# Patient Record
Sex: Male | Born: 1955 | Race: Black or African American | Hispanic: No | Marital: Single | Smoking: Never smoker
Health system: Southern US, Community
[De-identification: ages and names within clinical notes are randomized; demographics above are authoritative.]

## PROBLEM LIST (undated history)

## (undated) DIAGNOSIS — N400 Enlarged prostate without lower urinary tract symptoms: Secondary | ICD-10-CM

## (undated) DIAGNOSIS — I10 Essential (primary) hypertension: Secondary | ICD-10-CM

## (undated) DIAGNOSIS — I69322 Dysarthria following cerebral infarction: Secondary | ICD-10-CM

## (undated) DIAGNOSIS — R1310 Dysphagia, unspecified: Secondary | ICD-10-CM

## (undated) DIAGNOSIS — N029 Recurrent and persistent hematuria with unspecified morphologic changes: Secondary | ICD-10-CM

## (undated) DIAGNOSIS — I82409 Acute embolism and thrombosis of unspecified deep veins of unspecified lower extremity: Secondary | ICD-10-CM

## (undated) DIAGNOSIS — I639 Cerebral infarction, unspecified: Secondary | ICD-10-CM

## (undated) DIAGNOSIS — F32A Depression, unspecified: Secondary | ICD-10-CM

## (undated) DIAGNOSIS — F329 Major depressive disorder, single episode, unspecified: Secondary | ICD-10-CM

---

## 1998-01-10 ENCOUNTER — Other Ambulatory Visit: Admission: RE | Admit: 1998-01-10 | Discharge: 1998-01-10 | Payer: Self-pay | Admitting: Family Medicine

## 2001-06-21 ENCOUNTER — Ambulatory Visit (HOSPITAL_COMMUNITY): Admission: RE | Admit: 2001-06-21 | Discharge: 2001-06-21 | Payer: Self-pay | Admitting: Urology

## 2003-05-06 ENCOUNTER — Ambulatory Visit (HOSPITAL_BASED_OUTPATIENT_CLINIC_OR_DEPARTMENT_OTHER): Admission: RE | Admit: 2003-05-06 | Discharge: 2003-05-06 | Payer: Self-pay | Admitting: Internal Medicine

## 2003-06-05 ENCOUNTER — Encounter: Payer: Self-pay | Admitting: Gastroenterology

## 2003-06-05 ENCOUNTER — Encounter: Admission: RE | Admit: 2003-06-05 | Discharge: 2003-06-05 | Payer: Self-pay | Admitting: Gastroenterology

## 2003-06-30 ENCOUNTER — Ambulatory Visit (HOSPITAL_COMMUNITY): Admission: RE | Admit: 2003-06-30 | Discharge: 2003-06-30 | Payer: Self-pay | Admitting: Gastroenterology

## 2003-08-09 ENCOUNTER — Ambulatory Visit (HOSPITAL_BASED_OUTPATIENT_CLINIC_OR_DEPARTMENT_OTHER): Admission: RE | Admit: 2003-08-09 | Discharge: 2003-08-09 | Payer: Self-pay | Admitting: Internal Medicine

## 2004-08-20 ENCOUNTER — Ambulatory Visit: Payer: Self-pay | Admitting: Internal Medicine

## 2004-08-20 ENCOUNTER — Ambulatory Visit (HOSPITAL_BASED_OUTPATIENT_CLINIC_OR_DEPARTMENT_OTHER): Admission: RE | Admit: 2004-08-20 | Discharge: 2004-08-20 | Payer: Self-pay | Admitting: Internal Medicine

## 2004-12-10 ENCOUNTER — Ambulatory Visit (HOSPITAL_COMMUNITY): Admission: RE | Admit: 2004-12-10 | Discharge: 2004-12-10 | Payer: Self-pay | Admitting: Internal Medicine

## 2006-05-20 ENCOUNTER — Ambulatory Visit (HOSPITAL_BASED_OUTPATIENT_CLINIC_OR_DEPARTMENT_OTHER): Admission: RE | Admit: 2006-05-20 | Discharge: 2006-05-20 | Payer: Self-pay | Admitting: Internal Medicine

## 2006-05-24 ENCOUNTER — Ambulatory Visit: Payer: Self-pay | Admitting: Internal Medicine

## 2010-08-31 ENCOUNTER — Encounter: Payer: Self-pay | Admitting: Gastroenterology

## 2010-09-01 ENCOUNTER — Encounter: Payer: Self-pay | Admitting: Internal Medicine

## 2013-03-07 ENCOUNTER — Ambulatory Visit: Payer: Self-pay | Admitting: Family Medicine

## 2013-03-07 VITALS — BP 138/88 | HR 75 | Temp 98.0°F | Resp 16 | Ht 70.0 in | Wt 215.0 lb

## 2013-03-07 DIAGNOSIS — Z0289 Encounter for other administrative examinations: Secondary | ICD-10-CM

## 2013-03-07 NOTE — Progress Notes (Signed)
  Subjective:    Patient ID: [name] [name], male    DOB: 04-12-1956, 57 y.o.   MRN: [id number]  HPI [name] [name] is a 57 y.o. male  Dot physical. Hx of hematuria - hereditary, and followed by urology.   2 year card prior - no restrictions.    Review of Systems DOT form reviewed,     Objective:   Physical Exam  Vitals reviewed. Constitutional: He is oriented to person, place, and time. He appears well-developed and well-nourished.  HENT:  Head: Normocephalic and atraumatic.  Right Ear: External ear normal.  Left Ear: External ear normal.  Mouth/Throat: Oropharynx is clear and moist.  Eyes: Conjunctivae and EOM are normal. Pupils are equal, round, and reactive to light.  Neck: Normal range of motion. Neck supple. No thyromegaly present.  Cardiovascular: Normal rate, regular rhythm, normal heart sounds and intact distal pulses.   Pulmonary/Chest: Effort normal and breath sounds normal. No respiratory distress. He has no wheezes.  Abdominal: Soft. He exhibits no distension. There is no tenderness. Hernia confirmed negative in the right inguinal area and confirmed negative in the left inguinal area.  Musculoskeletal: Normal range of motion. He exhibits no edema and no tenderness.  Lymphadenopathy:    He has no cervical adenopathy.  Neurological: He is alert and oriented to person, place, and time. He has normal reflexes.  Skin: Skin is warm and dry.  Psychiatric: He has a normal mood and affect. His behavior is normal.       Assessment & Plan:  [name] is a 57 y.o. male Health examination of defined subpopulation  DOT physical. 2 year card. No restrictions.  Chronic hematuria, followed by urology.  No restrictions - 2 year card. See ppwk.

## 2013-04-12 HISTORY — PX: ESOPHAGOSCOPY W/ PERCUTANEOUS GASTROSTOMY TUBE PLACEMENT: SUR463

## 2013-04-25 ENCOUNTER — Other Ambulatory Visit (HOSPITAL_COMMUNITY): Payer: Self-pay | Admitting: Internal Medicine

## 2013-04-25 DIAGNOSIS — R1310 Dysphagia, unspecified: Secondary | ICD-10-CM

## 2013-04-26 ENCOUNTER — Emergency Department (HOSPITAL_COMMUNITY): Payer: BC Managed Care – PPO

## 2013-04-26 ENCOUNTER — Encounter (HOSPITAL_COMMUNITY): Payer: Self-pay | Admitting: *Deleted

## 2013-04-26 ENCOUNTER — Emergency Department (HOSPITAL_COMMUNITY)
Admission: EM | Admit: 2013-04-26 | Discharge: 2013-04-26 | Disposition: A | Discharge status: Skilled Nursing Facility | Payer: BC Managed Care – PPO | Attending: Emergency Medicine | Admitting: Emergency Medicine

## 2013-04-26 DIAGNOSIS — Z7982 Long term (current) use of aspirin: Secondary | ICD-10-CM | POA: Insufficient documentation

## 2013-04-26 DIAGNOSIS — Y9389 Activity, other specified: Secondary | ICD-10-CM | POA: Insufficient documentation

## 2013-04-26 DIAGNOSIS — W06XXXA Fall from bed, initial encounter: Secondary | ICD-10-CM | POA: Insufficient documentation

## 2013-04-26 DIAGNOSIS — I10 Essential (primary) hypertension: Secondary | ICD-10-CM | POA: Insufficient documentation

## 2013-04-26 DIAGNOSIS — Z86718 Personal history of other venous thrombosis and embolism: Secondary | ICD-10-CM | POA: Insufficient documentation

## 2013-04-26 DIAGNOSIS — S0180XA Unspecified open wound of other part of head, initial encounter: Secondary | ICD-10-CM | POA: Insufficient documentation

## 2013-04-26 DIAGNOSIS — F0390 Unspecified dementia without behavioral disturbance: Secondary | ICD-10-CM | POA: Insufficient documentation

## 2013-04-26 DIAGNOSIS — W19XXXA Unspecified fall, initial encounter: Secondary | ICD-10-CM

## 2013-04-26 DIAGNOSIS — Z7901 Long term (current) use of anticoagulants: Secondary | ICD-10-CM | POA: Insufficient documentation

## 2013-04-26 DIAGNOSIS — S0003XA Contusion of scalp, initial encounter: Secondary | ICD-10-CM | POA: Insufficient documentation

## 2013-04-26 DIAGNOSIS — Z79899 Other long term (current) drug therapy: Secondary | ICD-10-CM | POA: Insufficient documentation

## 2013-04-26 DIAGNOSIS — F329 Major depressive disorder, single episode, unspecified: Secondary | ICD-10-CM | POA: Insufficient documentation

## 2013-04-26 DIAGNOSIS — Y9210 Unspecified residential institution as the place of occurrence of the external cause: Secondary | ICD-10-CM | POA: Insufficient documentation

## 2013-04-26 DIAGNOSIS — F3289 Other specified depressive episodes: Secondary | ICD-10-CM | POA: Insufficient documentation

## 2013-04-26 DIAGNOSIS — S0083XA Contusion of other part of head, initial encounter: Secondary | ICD-10-CM

## 2013-04-26 DIAGNOSIS — Z8673 Personal history of transient ischemic attack (TIA), and cerebral infarction without residual deficits: Secondary | ICD-10-CM | POA: Insufficient documentation

## 2013-04-26 HISTORY — DX: Dysphagia, unspecified: R13.10

## 2013-04-26 HISTORY — DX: Essential (primary) hypertension: I10

## 2013-04-26 HISTORY — DX: Depression, unspecified: F32.A

## 2013-04-26 HISTORY — DX: Acute embolism and thrombosis of unspecified deep veins of unspecified lower extremity: I82.409

## 2013-04-26 HISTORY — DX: Major depressive disorder, single episode, unspecified: F32.9

## 2013-04-26 LAB — URINALYSIS, ROUTINE W REFLEX MICROSCOPIC
Ketones, ur: NEGATIVE mg/dL
Leukocytes, UA: NEGATIVE
Nitrite: NEGATIVE
Protein, ur: NEGATIVE mg/dL
pH: 5 (ref 5.0–8.0)

## 2013-04-26 LAB — CBC
HCT: 36.5 % — ABNORMAL LOW (ref 39.0–52.0)
Hemoglobin: 11.9 g/dL — ABNORMAL LOW (ref 13.0–17.0)
MCV: 98.9 fL (ref 78.0–100.0)
RBC: 3.69 MIL/uL — ABNORMAL LOW (ref 4.22–5.81)
RDW: 13.4 % (ref 11.5–15.5)
WBC: 11 10*3/uL — ABNORMAL HIGH (ref 4.0–10.5)

## 2013-04-26 LAB — BASIC METABOLIC PANEL
BUN: 35 mg/dL — ABNORMAL HIGH (ref 6–23)
CO2: 26 mEq/L (ref 19–32)
Chloride: 103 mEq/L (ref 96–112)
GFR calc Af Amer: 45 mL/min — ABNORMAL LOW (ref >90)
Potassium: 4 mEq/L (ref 3.5–5.1)

## 2013-04-26 LAB — PROTIME-INR: Prothrombin Time: 25.5 seconds — ABNORMAL HIGH (ref 11.6–15.2)

## 2013-04-26 LAB — LACTIC ACID, PLASMA: Lactic Acid, Venous: 2.4 mmol/L — ABNORMAL HIGH (ref 0.5–2.2)

## 2013-04-26 MED ORDER — SODIUM CHLORIDE 0.9 % IV BOLUS (SEPSIS)
500.0000 mL | Freq: Once | INTRAVENOUS | Status: AC
  Administered 2013-04-26: 500 mL via INTRAVENOUS

## 2013-04-26 NOTE — ED Provider Notes (Signed)
Approximately 1 cm linear laceration to the lateral aspect of the right eyebrow. Bleeding controlled. Cranial nerves III-XII grossly intact. Wound thoroughly cleaned with saline and beta-iodine. Dermabond placed.   Raymon Mutton, PA-C 04/26/13 [phone]

## 2013-04-26 NOTE — ED Notes (Signed)
Pt is on coumadin and asa

## 2013-04-26 NOTE — ED Provider Notes (Signed)
CSN: [id number]     Arrival date & time 04/26/13  2952 History   First MD Initiated Contact with Patient 04/26/13 0547     Chief Complaint  Patient presents with  . Fall  . Head Laceration   (Consider location/radiation/quality/duration/timing/severity/associated sxs/prior Treatment) HPI Comments: Stays at [facility]. Patient found in floor next to his bed - has hematoma to R periorbital area.  Unknown circumstances of falls - found on mat beside bed. Last time seen in bed - about 30 prior min to being found. No LOC.  At baseline - alert to himself. Follows commands. R sided paresis.  Patient is a 57 y.o. male presenting with fall and scalp laceration. The history is provided by the patient.  Fall This is a recurrent problem. Episode onset: unknown. Episode frequency: once. The problem has been resolved. Pertinent negatives include no chest pain, no abdominal pain and no headaches. Nothing aggravates the symptoms. Nothing relieves the symptoms. He has tried nothing for the symptoms.  Head Laceration Pertinent negatives include no chest pain, no abdominal pain and no headaches.    Past Medical History  Diagnosis Date  . Stroke   . Dysphagia   . DVT (deep venous thrombosis)   . Aphasia   . Hypertension   . Depression    No past surgical history on file. No family history on file. History  Substance Use Topics  . Smoking status: Never Smoker   . Smokeless tobacco: Not on file  . Alcohol Use: No    Review of Systems  Unable to perform ROS: Dementia  Cardiovascular: Negative for chest pain.  Gastrointestinal: Negative for abdominal pain.  Neurological: Negative for headaches.    Allergies  Review of patient's allergies indicates no known allergies.  Home Medications   Current Outpatient Rx  Name  Route  Sig  Dispense  Refill  . aspirin 81 MG chewable tablet   PEG Tube   81 mg by PEG Tube route daily.         [name] atorvastatin (LIPITOR) 40 MG tablet   PEG Tube   40 mg by PEG Tube route daily.         [name] enoxaparin (LOVENOX) 100 MG/ML injection   Subcutaneous   Inject 100 mg into the skin daily.         [name] LORazepam in dextrose solution   Intravenous   Inject 1 mg into the vein as needed (for agitation).         . polyethylene glycol (MIRALAX / GLYCOLAX) packet   PEG Tube   17 g by PEG Tube route daily as needed (for constipation).         . polyvinyl alcohol (LIQUIFILM TEARS) 1.4 % ophthalmic solution   Both Eyes   Place 1 drop into both eyes as needed (for dry eyes).         . QUEtiapine (SEROQUEL) 25 MG tablet   PEG Tube   25 mg by PEG Tube route at bedtime.         . senna-docusate (SENOKOT-S) 8.6-50 MG per tablet   PEG Tube   2 tablets by PEG Tube route every evening.         . sertraline (ZOLOFT) 25 MG tablet   PEG Tube   25 mg by PEG Tube route daily.         [name] tuberculin 5 UNIT/0.1ML injection   Intradermal   Inject 5 Units into the skin once.         [name]  warfarin (COUMADIN) 5 MG tablet   PEG Tube   5 mg by PEG Tube route daily.          BP 139/83  Pulse 90  Temp(Src) 99.3 F (37.4 C) (Oral)  Resp 18  SpO2 97% Physical Exam  Constitutional: He is oriented to person, place, and time. He appears well-developed and well-nourished. No distress.  HENT:  Head: Normocephalic.    Mouth/Throat: No oropharyngeal exudate.  EOMs normal, no entrapment  Eyes: EOM are normal. Pupils are equal, round, and reactive to light.  Neck: Normal range of motion. Neck supple.  Cardiovascular: Normal rate and regular rhythm.  Exam reveals no friction rub.   No murmur heard. Pulmonary/Chest: Effort normal and breath sounds normal. No respiratory distress. He has no wheezes. He has no rales.  Abdominal: He exhibits no distension. There is no tenderness. There is no rebound.  Musculoskeletal: Normal range of motion. He exhibits no edema.  Neurological: He is alert and oriented to person, place, and time.    Skin: He is not diaphoretic.    ED Course  Procedures (including critical care time) Labs Review Labs Reviewed  CBC - Abnormal; Notable for the following:    WBC 11.0 (*)    RBC 3.69 (*)    Hemoglobin 11.9 (*)    HCT 36.5 (*)    All other components within normal limits  BASIC METABOLIC PANEL - Abnormal; Notable for the following:    Glucose, Bld 133 (*)    BUN 35 (*)    Creatinine, Ser 1.83 (*)    GFR calc non Af Amer 39 (*)    GFR calc Af Amer 45 (*)    All other components within normal limits  PROTIME-INR - Abnormal; Notable for the following:    Prothrombin Time 25.5 (*)    INR 2.42 (*)    All other components within normal limits  LACTIC ACID, PLASMA - Abnormal; Notable for the following:    Lactic Acid, Venous 2.4 (*)    All other components within normal limits  URINALYSIS, ROUTINE W REFLEX MICROSCOPIC - Abnormal; Notable for the following:    Hgb urine dipstick LARGE (*)    All other components within normal limits  URINE MICROSCOPIC-ADD ON   Imaging Review Ct Head Wo Contrast - If Head Trauma Is Known/suspected And/or Pt Is Taking Anticoagulant  04/26/2013   CLINICAL DATA:  57 year old male status post fall with blunt trauma, hematoma, laceration.  EXAM: CT HEAD WITHOUT CONTRAST  CT MAXILLOFACIAL WITHOUT CONTRAST  CT CERVICAL SPINE WITHOUT CONTRAST  TECHNIQUE: Multidetector CT imaging of the head, cervical spine, and maxillofacial structures were performed using the standard protocol without intravenous contrast. Multiplanar CT image reconstructions of the cervical spine and maxillofacial structures were also generated.  COMPARISON:  None.  FINDINGS: CT HEAD FINDINGS  Right forehead scalp hematoma measuring up to 9 mm in thickness. Right frontal bone intact. Superior scalp soft tissues within normal limits. No calvarium fracture. Mastoids are clear.  Confluent and profound hypodensity in the left brainstem (series 2, image 8). Elsewhere posterior fossa gray-white matter  differentiation within normal limits. No midline shift, mass effect, or evidence of intracranial mass lesion. No ventriculomegaly. No acute intracranial hemorrhage identified. No evidence of cortically based acute infarction identified. No suspicious intracranial vascular hyperdensity.  CT MAXILLOFACIAL FINDINGS  Right periorbital soft tissue injury contiguous with that at the right forehead. Right globe intact. Bilateral intraorbital soft tissues are within normal limits.  Visible deep soft tissue  spaces of the face and neck remarkable for bilateral retropharyngeal carotid arteries. Small volume retained secretions in the pharynx.  Minor paranasal sinus mucosal thickening. Chronic appearing nasal process of the maxilla fractures. Mandible intact. No orbital wall fracture. No acute facial fracture.  CT CERVICAL SPINE FINDINGS  Straightening of cervical lordosis. Visualized skull base is intact. No atlanto-occipital dissociation. Cervicothoracic junction alignment is within normal limits. Bilateral posterior element alignment is within normal limits. Chronic disc and endplate degeneration most pronounced at C5-C6. No acute cervical fracture identified.  Negative lung apices. Visible upper thoracic levels appear intact. Visualized paraspinal soft tissues are within normal limits. Retropharyngeal course of both carotid arteries at the upper cervical spine level.  IMPRESSION: CT HEAD IMPRESSION  1. No acute traumatic injury to the brain. Chronic left brainstem infarct.  2.  Forehead scalp hematoma without underlying fracture.  CT MAXILLOFACIAL IMPRESSION  Right periorbital soft tissue injury. No acute facial fracture identified.  CT CERVICAL SPINE IMPRESSION  No acute fracture or listhesis identified in the cervical spine. Ligamentous injury is not excluded.   Electronically Signed   By: Augusto Gamble M.D.   On: 04/26/2013 07:59   Ct Cervical Spine Wo Contrast  04/26/2013   CLINICAL DATA:  57 year old male status post  fall with blunt trauma, hematoma, laceration.  EXAM: CT HEAD WITHOUT CONTRAST  CT MAXILLOFACIAL WITHOUT CONTRAST  CT CERVICAL SPINE WITHOUT CONTRAST  TECHNIQUE: Multidetector CT imaging of the head, cervical spine, and maxillofacial structures were performed using the standard protocol without intravenous contrast. Multiplanar CT image reconstructions of the cervical spine and maxillofacial structures were also generated.  COMPARISON:  None.  FINDINGS: CT HEAD FINDINGS  Right forehead scalp hematoma measuring up to 9 mm in thickness. Right frontal bone intact. Superior scalp soft tissues within normal limits. No calvarium fracture. Mastoids are clear.  Confluent and profound hypodensity in the left brainstem (series 2, image 8). Elsewhere posterior fossa gray-white matter differentiation within normal limits. No midline shift, mass effect, or evidence of intracranial mass lesion. No ventriculomegaly. No acute intracranial hemorrhage identified. No evidence of cortically based acute infarction identified. No suspicious intracranial vascular hyperdensity.  CT MAXILLOFACIAL FINDINGS  Right periorbital soft tissue injury contiguous with that at the right forehead. Right globe intact. Bilateral intraorbital soft tissues are within normal limits.  Visible deep soft tissue spaces of the face and neck remarkable for bilateral retropharyngeal carotid arteries. Small volume retained secretions in the pharynx.  Minor paranasal sinus mucosal thickening. Chronic appearing nasal process of the maxilla fractures. Mandible intact. No orbital wall fracture. No acute facial fracture.  CT CERVICAL SPINE FINDINGS  Straightening of cervical lordosis. Visualized skull base is intact. No atlanto-occipital dissociation. Cervicothoracic junction alignment is within normal limits. Bilateral posterior element alignment is within normal limits. Chronic disc and endplate degeneration most pronounced at C5-C6. No acute cervical fracture  identified.  Negative lung apices. Visible upper thoracic levels appear intact. Visualized paraspinal soft tissues are within normal limits. Retropharyngeal course of both carotid arteries at the upper cervical spine level.  IMPRESSION: CT HEAD IMPRESSION  1. No acute traumatic injury to the brain. Chronic left brainstem infarct.  2.  Forehead scalp hematoma without underlying fracture.  CT MAXILLOFACIAL IMPRESSION  Right periorbital soft tissue injury. No acute facial fracture identified.  CT CERVICAL SPINE IMPRESSION  No acute fracture or listhesis identified in the cervical spine. Ligamentous injury is not excluded.   Electronically Signed   By: Augusto Gamble M.D.   On: 04/26/2013  07:59   Ct Maxillofacial Wo Cm  04/26/2013   CLINICAL DATA:  57 year old male status post fall with blunt trauma, hematoma, laceration.  EXAM: CT HEAD WITHOUT CONTRAST  CT MAXILLOFACIAL WITHOUT CONTRAST  CT CERVICAL SPINE WITHOUT CONTRAST  TECHNIQUE: Multidetector CT imaging of the head, cervical spine, and maxillofacial structures were performed using the standard protocol without intravenous contrast. Multiplanar CT image reconstructions of the cervical spine and maxillofacial structures were also generated.  COMPARISON:  None.  FINDINGS: CT HEAD FINDINGS  Right forehead scalp hematoma measuring up to 9 mm in thickness. Right frontal bone intact. Superior scalp soft tissues within normal limits. No calvarium fracture. Mastoids are clear.  Confluent and profound hypodensity in the left brainstem (series 2, image 8). Elsewhere posterior fossa gray-white matter differentiation within normal limits. No midline shift, mass effect, or evidence of intracranial mass lesion. No ventriculomegaly. No acute intracranial hemorrhage identified. No evidence of cortically based acute infarction identified. No suspicious intracranial vascular hyperdensity.  CT MAXILLOFACIAL FINDINGS  Right periorbital soft tissue injury contiguous with that at the  right forehead. Right globe intact. Bilateral intraorbital soft tissues are within normal limits.  Visible deep soft tissue spaces of the face and neck remarkable for bilateral retropharyngeal carotid arteries. Small volume retained secretions in the pharynx.  Minor paranasal sinus mucosal thickening. Chronic appearing nasal process of the maxilla fractures. Mandible intact. No orbital wall fracture. No acute facial fracture.  CT CERVICAL SPINE FINDINGS  Straightening of cervical lordosis. Visualized skull base is intact. No atlanto-occipital dissociation. Cervicothoracic junction alignment is within normal limits. Bilateral posterior element alignment is within normal limits. Chronic disc and endplate degeneration most pronounced at C5-C6. No acute cervical fracture identified.  Negative lung apices. Visible upper thoracic levels appear intact. Visualized paraspinal soft tissues are within normal limits. Retropharyngeal course of both carotid arteries at the upper cervical spine level.  IMPRESSION: CT HEAD IMPRESSION  1. No acute traumatic injury to the brain. Chronic left brainstem infarct.  2.  Forehead scalp hematoma without underlying fracture.  CT MAXILLOFACIAL IMPRESSION  Right periorbital soft tissue injury. No acute facial fracture identified.  CT CERVICAL SPINE IMPRESSION  No acute fracture or listhesis identified in the cervical spine. Ligamentous injury is not excluded.   Electronically Signed   By: Augusto Gamble M.D.   On: 04/26/2013 07:59     Date: 04/26/2013  Rate: 90  Rhythm: normal sinus rhythm  QRS Axis: normal  Intervals: normal  ST/T Wave abnormalities: normal  Conduction Disutrbances:none  Narrative Interpretation:   Old EKG Reviewed: none available   MDM   1. Fall, initial encounter   2. Traumatic hematoma of forehead, initial encounter    22M presents s/p fall - unknown circumstances - found on a mat beside his bed. No LOC. Alert x1 at baseline with R sided paresis. Patient here  alert, following commands. R periorbital swelling, EOMs intact. Small superificial laceration to R lateral eyebrow - repaired with dermabond. I spoke with facility - they will have a physician see the patient today.  CTs ordered. Labs show mild elevation in lactate, 1 L NS given. No UTI. INR 2.42 - on Coumadin for DVT.  Repeat lactate normal after fluid. Imaging with acute intracranial pathology. Stable for transfer back to facility.     Dagmar Hait, MD 04/27/13 [phone]

## 2013-04-26 NOTE — ED Notes (Signed)
Results of lactic acid shown to dr. Gwendolyn Grant

## 2013-04-26 NOTE — ED Notes (Signed)
Report received, assumed care.  

## 2013-04-26 NOTE — ED Notes (Signed)
[facility] called for transportation  

## 2013-04-26 NOTE — ED Notes (Signed)
Patient transported to CT 

## 2013-04-26 NOTE — ED Notes (Signed)
Pt discharged.Vital signs stable.Pt transported by [facility] to nursing home.

## 2013-04-26 NOTE — ED Provider Notes (Signed)
Medical screening examination/treatment/procedure(s) were conducted as a shared visit with non-physician practitioner(s) and myself.  I personally evaluated the patient during the encounter  See my note. Lac repair by PA.  Dagmar Hait, MD 04/26/13 [phone]

## 2013-04-26 NOTE — ED Notes (Signed)
Pt placed in soft [place] collar per MD order and precautions maintained

## 2013-04-26 NOTE — ED Notes (Signed)
Pt rolled out of bed and now has hematoma to right forehead.  Pt is from [facility] care and usually rolls off bed.  Pt has history stroke and is usually only alert to self.  bp 120/90. p-100

## 2013-04-30 ENCOUNTER — Emergency Department (HOSPITAL_COMMUNITY): Payer: BC Managed Care – PPO

## 2013-04-30 ENCOUNTER — Inpatient Hospital Stay (HOSPITAL_COMMUNITY)
Admission: EM | Admit: 2013-04-30 | Discharge: 2013-05-05 | DRG: 397 | Disposition: A | Discharge status: Skilled Nursing Facility | Payer: BC Managed Care – PPO | Attending: Internal Medicine | Admitting: Internal Medicine

## 2013-04-30 DIAGNOSIS — Z7901 Long term (current) use of anticoagulants: Secondary | ICD-10-CM

## 2013-04-30 DIAGNOSIS — I639 Cerebral infarction, unspecified: Secondary | ICD-10-CM

## 2013-04-30 DIAGNOSIS — R1310 Dysphagia, unspecified: Secondary | ICD-10-CM

## 2013-04-30 DIAGNOSIS — Z978 Presence of other specified devices: Secondary | ICD-10-CM

## 2013-04-30 DIAGNOSIS — I10 Essential (primary) hypertension: Secondary | ICD-10-CM | POA: Diagnosis present

## 2013-04-30 DIAGNOSIS — N1330 Unspecified hydronephrosis: Secondary | ICD-10-CM | POA: Diagnosis present

## 2013-04-30 DIAGNOSIS — R791 Abnormal coagulation profile: Secondary | ICD-10-CM

## 2013-04-30 DIAGNOSIS — S0181XA Laceration without foreign body of other part of head, initial encounter: Secondary | ICD-10-CM

## 2013-04-30 DIAGNOSIS — I69322 Dysarthria following cerebral infarction: Secondary | ICD-10-CM

## 2013-04-30 DIAGNOSIS — N390 Urinary tract infection, site not specified: Secondary | ICD-10-CM

## 2013-04-30 DIAGNOSIS — Z79899 Other long term (current) drug therapy: Secondary | ICD-10-CM

## 2013-04-30 DIAGNOSIS — R339 Retention of urine, unspecified: Secondary | ICD-10-CM | POA: Diagnosis present

## 2013-04-30 DIAGNOSIS — D62 Acute posthemorrhagic anemia: Secondary | ICD-10-CM | POA: Diagnosis present

## 2013-04-30 DIAGNOSIS — F3289 Other specified depressive episodes: Secondary | ICD-10-CM | POA: Diagnosis present

## 2013-04-30 DIAGNOSIS — I69991 Dysphagia following unspecified cerebrovascular disease: Secondary | ICD-10-CM

## 2013-04-30 DIAGNOSIS — I82A19 Acute embolism and thrombosis of unspecified axillary vein: Secondary | ICD-10-CM | POA: Diagnosis present

## 2013-04-30 DIAGNOSIS — Z86718 Personal history of other venous thrombosis and embolism: Secondary | ICD-10-CM

## 2013-04-30 DIAGNOSIS — D689 Coagulation defect, unspecified: Principal | ICD-10-CM | POA: Diagnosis present

## 2013-04-30 DIAGNOSIS — I69959 Hemiplegia and hemiparesis following unspecified cerebrovascular disease affecting unspecified side: Secondary | ICD-10-CM

## 2013-04-30 DIAGNOSIS — K625 Hemorrhage of anus and rectum: Secondary | ICD-10-CM | POA: Diagnosis present

## 2013-04-30 DIAGNOSIS — I82A11 Acute embolism and thrombosis of right axillary vein: Secondary | ICD-10-CM | POA: Diagnosis present

## 2013-04-30 DIAGNOSIS — I69920 Aphasia following unspecified cerebrovascular disease: Secondary | ICD-10-CM

## 2013-04-30 DIAGNOSIS — D72829 Elevated white blood cell count, unspecified: Secondary | ICD-10-CM | POA: Diagnosis present

## 2013-04-30 DIAGNOSIS — I69351 Hemiplegia and hemiparesis following cerebral infarction affecting right dominant side: Secondary | ICD-10-CM

## 2013-04-30 DIAGNOSIS — Z934 Other artificial openings of gastrointestinal tract status: Secondary | ICD-10-CM

## 2013-04-30 DIAGNOSIS — E870 Hyperosmolality and hypernatremia: Secondary | ICD-10-CM | POA: Diagnosis present

## 2013-04-30 DIAGNOSIS — F329 Major depressive disorder, single episode, unspecified: Secondary | ICD-10-CM | POA: Diagnosis present

## 2013-04-30 DIAGNOSIS — K668 Other specified disorders of peritoneum: Secondary | ICD-10-CM

## 2013-04-30 DIAGNOSIS — N138 Other obstructive and reflux uropathy: Secondary | ICD-10-CM | POA: Diagnosis present

## 2013-04-30 DIAGNOSIS — E876 Hypokalemia: Secondary | ICD-10-CM

## 2013-04-30 DIAGNOSIS — N401 Enlarged prostate with lower urinary tract symptoms: Secondary | ICD-10-CM | POA: Diagnosis present

## 2013-04-30 DIAGNOSIS — N029 Recurrent and persistent hematuria with unspecified morphologic changes: Secondary | ICD-10-CM

## 2013-04-30 DIAGNOSIS — D649 Anemia, unspecified: Secondary | ICD-10-CM

## 2013-04-30 DIAGNOSIS — K922 Gastrointestinal hemorrhage, unspecified: Secondary | ICD-10-CM

## 2013-04-30 HISTORY — DX: Recurrent and persistent hematuria with unspecified morphologic changes: N02.9

## 2013-04-30 HISTORY — DX: Dysarthria following cerebral infarction: I69.322

## 2013-04-30 HISTORY — DX: Cerebral infarction, unspecified: I63.9

## 2013-04-30 LAB — COMPREHENSIVE METABOLIC PANEL
AST: 33 U/L (ref 0–37)
Alkaline Phosphatase: 54 U/L (ref 39–117)
BUN: 27 mg/dL — ABNORMAL HIGH (ref 6–23)
CO2: 29 mEq/L (ref 19–32)
Chloride: 110 mEq/L (ref 96–112)
Creatinine, Ser: 1.29 mg/dL (ref 0.50–1.35)
GFR calc non Af Amer: 60 mL/min — ABNORMAL LOW (ref >90)
Total Bilirubin: 0.6 mg/dL (ref 0.3–1.2)

## 2013-04-30 LAB — CBC WITH DIFFERENTIAL/PLATELET
Eosinophils Relative: 0 % (ref 0–5)
HCT: 32 % — ABNORMAL LOW (ref 39.0–52.0)
Hemoglobin: 10.3 g/dL — ABNORMAL LOW (ref 13.0–17.0)
Lymphs Abs: 1.6 10*3/uL (ref 0.7–4.0)
MCV: 99.4 fL (ref 78.0–100.0)
Monocytes Relative: 7 % (ref 3–12)
Neutro Abs: 12.8 10*3/uL — ABNORMAL HIGH (ref 1.7–7.7)
RBC: 3.22 MIL/uL — ABNORMAL LOW (ref 4.22–5.81)
WBC: 15.5 10*3/uL — ABNORMAL HIGH (ref 4.0–10.5)

## 2013-04-30 LAB — PROTIME-INR: Prothrombin Time: 47.7 seconds — ABNORMAL HIGH (ref 11.6–15.2)

## 2013-04-30 LAB — URINALYSIS, ROUTINE W REFLEX MICROSCOPIC
Ketones, ur: NEGATIVE mg/dL
Nitrite: NEGATIVE
Protein, ur: NEGATIVE mg/dL
Specific Gravity, Urine: 1.017 (ref 1.005–1.030)
Urobilinogen, UA: 1 mg/dL (ref 0.0–1.0)

## 2013-04-30 LAB — APTT: aPTT: 74 seconds — ABNORMAL HIGH (ref 24–37)

## 2013-04-30 LAB — CG4 I-STAT (LACTIC ACID): Lactic Acid, Venous: 1.48 mmol/L (ref 0.5–2.2)

## 2013-04-30 LAB — URINE MICROSCOPIC-ADD ON

## 2013-04-30 LAB — TYPE AND SCREEN: ABO/RH(D): A POS

## 2013-04-30 MED ORDER — IOHEXOL 300 MG/ML  SOLN: 50.0000 mL | Freq: Once | INTRAMUSCULAR | Status: DC | PRN

## 2013-04-30 MED ORDER — DEXTROSE 5 % IV SOLN
1.0000 g | Freq: Once | INTRAVENOUS | Status: AC
  Administered 2013-04-30: 1 g via INTRAVENOUS
  Filled 2013-04-30: qty 10

## 2013-04-30 MED ORDER — IOHEXOL 300 MG/ML  SOLN
50.0000 mL | Freq: Once | INTRAMUSCULAR | Status: AC | PRN
  Administered 2013-04-30: 50 mL via ORAL

## 2013-04-30 MED ORDER — SODIUM CHLORIDE 0.9 % IV BOLUS (SEPSIS)
1000.0000 mL | Freq: Once | INTRAVENOUS | Status: AC
  Administered 2013-04-30: 1000 mL via INTRAVENOUS

## 2013-04-30 MED ORDER — PANTOPRAZOLE SODIUM 40 MG IV SOLR
40.0000 mg | Freq: Once | INTRAVENOUS | Status: AC
  Administered 2013-04-30: 40 mg via INTRAVENOUS
  Filled 2013-04-30: qty 40

## 2013-04-30 NOTE — Consult Note (Signed)
Reason for Consult:free air Referring Physician: Nita Sickle PA  [name] [name] is an 57 y.o. male.  HPI: I was asked to evaluate this patient for pneumoperitoneum. About one month ago he had a stroke with right-sided deficits. He subsequently had nasoenteric feeding tube which he kept removing and PEG tube was placed about 2 weeks ago at [facility]. He is at a care facility and they brought him into the emergency room for evaluation of bloody stools. He was also noted to have a temperature of 103 at the facility, although he has been afebrile here. Chest x-ray was ordered for evaluation of a temperature, and this demonstrated pneumoperitoneum. He denies any NSAID use except for some occasional Motrin but he does take a baby aspirin. He is on Coumadin for his stroke. His family is at the bedside from which most of the history is obtained and they say that he has had some "tarry" stools. The patient currently denies any abdominal pain although he has had some pain with urination.  Past Medical History  Diagnosis Date  . Stroke   . Dysphagia   . DVT (deep venous thrombosis)   . Aphasia   . Hypertension   . Depression     No past surgical history on file.  No family history on file.  Social History:  reports that he has never smoked. He does not have any smokeless tobacco history on file. He reports that he does not drink alcohol or use illicit drugs.  Allergies: No Known Allergies  Medications: see EPIC  Results for orders placed during the hospital encounter of 04/30/13 (from the past 48 hour(s))  URINALYSIS, ROUTINE W REFLEX MICROSCOPIC     Status: Abnormal   Collection Time    04/30/13  7:06 PM      Result Value Range   Color, Urine YELLOW  YELLOW   APPearance CLOUDY (*) CLEAR   Specific Gravity, Urine 1.017  1.005 - 1.030   pH 6.5  5.0 - 8.0   Glucose, UA NEGATIVE  NEGATIVE mg/dL   Hgb urine dipstick LARGE (*) NEGATIVE   Bilirubin Urine NEGATIVE  NEGATIVE   Ketones, ur NEGATIVE  NEGATIVE mg/dL   Protein, ur NEGATIVE  NEGATIVE mg/dL   Urobilinogen, UA 1.0  0.0 - 1.0 mg/dL   Nitrite NEGATIVE  NEGATIVE   Leukocytes, UA LARGE (*) NEGATIVE  URINE MICROSCOPIC-ADD ON     Status: Abnormal   Collection Time    04/30/13  7:06 PM      Result Value Range   WBC, UA TOO NUMEROUS TO COUNT  <3 WBC/hpf   Comment: WITH CLUMPS   RBC / HPF 11-20  <3 RBC/hpf   Bacteria, UA MANY (*) RARE  CBC WITH DIFFERENTIAL     Status: Abnormal   Collection Time    04/30/13  8:05 PM      Result Value Range   WBC 15.5 (*) 4.0 - 10.5 K/uL   RBC 3.22 (*) 4.22 - 5.81 MIL/uL   Hemoglobin 10.3 (*) 13.0 - 17.0 g/dL   HCT 16.1 (*) 09.6 - 04.5 %   MCV 99.4  78.0 - 100.0 fL   MCH 32.0  26.0 - 34.0 pg   MCHC 32.2  30.0 - 36.0 g/dL   RDW 40.9  81.1 - 91.4 %   Platelets 214  150 - 400 K/uL   Neutrophils Relative % 83 (*) 43 - 77 %   Lymphocytes Relative 10 (*) 12 - 46 %  Monocytes Relative 7  3 - 12 %   Eosinophils Relative 0  0 - 5 %   Basophils Relative 0  0 - 1 %   Neutro Abs 12.8 (*) 1.7 - 7.7 K/uL   Lymphs Abs 1.6  0.7 - 4.0 K/uL   Monocytes Absolute 1.1 (*) 0.1 - 1.0 K/uL   Eosinophils Absolute 0.0  0.0 - 0.7 K/uL   Basophils Absolute 0.0  0.0 - 0.1 K/uL   WBC Morphology ATYPICAL LYMPHOCYTES     Comment: VACUOLATED NEUTROPHILS     DOHLE BODIES  COMPREHENSIVE METABOLIC PANEL     Status: Abnormal   Collection Time    04/30/13  8:05 PM      Result Value Range   Sodium 148 (*) 135 - 145 mEq/L   Potassium 3.7  3.5 - 5.1 mEq/L   Chloride 110  96 - 112 mEq/L   CO2 29  19 - 32 mEq/L   Glucose, Bld 124 (*) 70 - 99 mg/dL   BUN 27 (*) 6 - 23 mg/dL   Creatinine, Ser 4.09  0.50 - 1.35 mg/dL   Calcium 9.3  8.4 - 81.1 mg/dL   Total Protein 7.0  6.0 - 8.3 g/dL   Albumin 2.4 (*) 3.5 - 5.2 g/dL   AST 33  0 - 37 U/L   ALT 104 (*) 0 - 53 U/L   Alkaline Phosphatase 54  39 - 117 U/L   Total Bilirubin 0.6  0.3 - 1.2 mg/dL   GFR calc non Af Amer 60 (*) >90 mL/min   GFR calc Af  Amer 69 (*) >90 mL/min   Comment: (NOTE)     The eGFR has been calculated using the CKD EPI equation.     This calculation has not been validated in all clinical situations.     eGFR's persistently <90 mL/min signify possible Chronic Kidney     Disease.  TYPE AND SCREEN     Status: None   Collection Time    04/30/13  8:05 PM      Result Value Range   ABO/RH(D) A POS     Antibody Screen NEG     Sample Expiration 05/03/2013    PROTIME-INR     Status: Abnormal   Collection Time    04/30/13  8:05 PM      Result Value Range   Prothrombin Time 47.7 (*) 11.6 - 15.2 seconds   INR 5.49 (*) 0.00 - 1.49   Comment: REPEATED TO VERIFY     CRITICAL RESULT CALLED TO, READ BACK BY AND VERIFIED WITH:     A DENNIS AT 2133 ON 09.20.2014 BY NBROOKS  APTT     Status: Abnormal   Collection Time    04/30/13  8:05 PM      Result Value Range   aPTT 74 (*) 24 - 37 seconds   Comment:            IF BASELINE aPTT IS ELEVATED,     SUGGEST PATIENT RISK ASSESSMENT     BE USED TO DETERMINE APPROPRIATE     ANTICOAGULANT THERAPY.  ABO/RH     Status: None   Collection Time    04/30/13  8:05 PM      Result Value Range   ABO/RH(D) A POS    CG4 I-STAT (LACTIC ACID)     Status: None   Collection Time    04/30/13  8:23 PM      Result Value Range   Lactic  Acid, Venous 1.48  0.5 - 2.2 mmol/L  PREPARE FRESH FROZEN PLASMA     Status: None   Collection Time    04/30/13  8:42 PM      Result Value Range   Unit Number [id number]     Blood Component Type THAWED PLASMA     Unit division 00     Status of Unit ALLOCATED     Transfusion Status OK TO TRANSFUSE     Unit Number [id number]     Blood Component Type THAWED PLASMA     Unit division 00     Status of Unit ALLOCATED     Transfusion Status OK TO TRANSFUSE      Dg Chest 2 View  04/30/2013   CLINICAL DATA:  Pain and fever  EXAM: CHEST  2 VIEW  COMPARISON:  None.  FINDINGS: There is extensive pneumoperitoneum. There is no edema or consolidation. Heart  is upper normal in size with normal pulmonary vascularity. No adenopathy. No bone lesions.  IMPRESSION: Pneumoperitoneum.  No edema or consolidation.  CriticalValue/emergent results were called by telephone at the time of interpretation on 04/30/2013 at 8:18 PMto the emergency department physician covering [facility], who verbally acknowledged these results.   Electronically Signed   By: Bretta Bang   On: 04/30/2013 20:20    All other review of systems negative or noncontributory except as stated in the HPI   Blood pressure 125/82, pulse 91, temperature 98.3 F (36.8 C), temperature source Oral, resp. rate 16, SpO2 96.00%. General appearance: alert, cooperative and no distress Head: Normocephalic, without obvious abnormality, atraumatic, facial asymmetry Resp: nonlabored Cardio: normal rate GI: soft, completely nontender, ND, no tympany, no peritoneal signs, LUQ gtube and site looks okay Extremities: extremities normal, atraumatic, no cyanosis or edema Pulses: 2+ and symmetric Neurologic: Alert and oriented X 3, notable right sided weakness, and facial symmetry, speech difficulty  Assessment/Plan: Pneumoperitoneum of uncertain etiology.  I am sure what is causing his pneumoperitoneum but he is completely pain-free, nondistended, and nontympanitic. This could be from his G-tube, both the pneumoperitoneum as well as the blood in the stools.  His G-tube may have pulled out or he could have another primary cause such as perforated peptic ulcer or duodenal ulcer or diverticulitis or other GI perforation. However, since she is completely pain-free and shows no evidence of free perforation, and the fact that his INR is over 5, I do not see any need for emergent surgery at this time. I would recommend further investigation of the potential source. I would first do a tube check of his G-tube to make sure that it is placed properly and if this is in the stomach, then I'll recommend a contrast  CT scan. If there is no extravasation of contrast, then I would consider nonoperative management given his exam.  If he does show evidence of perforation and required surgery then he will need his INR corrected however I would not necessarily give him any FFP at this time because again, his abdomen is completely benign and I would not offer any surgery unless there is clearly free perforation. We will discuss the options after further investigation but the patient and the family were in agreement with this plan.  We did discuss the usual causes of pneumoperitoneum and they understand that prompt surgical management is usually crucial, but they also would not like to pursue surgery without further evidence that he does have perforation. I would also recommend PPI's and antibiotic management  for his UTI.  Lodema Pilot DAVID 04/30/2013, 10:31 PM

## 2013-04-30 NOTE — ED Provider Notes (Signed)
CSN: [id number]     Arrival date & time 04/30/13  1828 History   First MD Initiated Contact with Patient 04/30/13 1842     Chief Complaint  Patient presents with  . GI Bleeding   (Consider location/radiation/quality/duration/timing/severity/associated sxs/prior Treatment) HPI Comments: Patient with a history of CVA, HTN, and DVT presents today from Nursing Home due to rectal bleeding.  Nursing home staff noticed dark red blood in the patient's stool earlier today.  Stool has also been watery in consistency today.  Family also reports that the patient has been complaining of diffuse abdominal pain for the past week.  According to nursing home staff the patient began running a fever of 103 today.  Patient was given Tylenol by staff just prior to arrival.  Patient has not had any nausea or vomiting.  Patient does report some abdominal pain at this time.  He denies chest pain, cough, or SOB.  He also reports that he has had some dysuria over the past couple of days.  Patient is currently on Coumadin due to past history of DVT.  Patient has also been recently hospitalized at [facility] two weeks ago due to a CVA.  He does have some residual right sided weakness and aphasia secondary to the CVA.  He had a PEG tube placed at [facility] two weeks ago.    The history is provided by the patient.    Past Medical History  Diagnosis Date  . Stroke   . Dysphagia   . DVT (deep venous thrombosis)   . Aphasia   . Hypertension   . Depression    No past surgical history on file. No family history on file. History  Substance Use Topics  . Smoking status: Never Smoker   . Smokeless tobacco: Not on file  . Alcohol Use: No    Review of Systems  Constitutional: Positive for fever.  Gastrointestinal: Positive for abdominal pain, diarrhea and blood in stool. Negative for vomiting.  All other systems reviewed and are negative.    Allergies  Review of patient's allergies indicates no known  allergies.  Home Medications   Current Outpatient Rx  Name  Route  Sig  Dispense  Refill  . acetaminophen (TYLENOL) 160 MG/5ML liquid   Oral   Take by mouth every 4 (four) hours as needed for fever.         [name] aspirin 81 MG chewable tablet   PEG Tube   81 mg by PEG Tube route daily.         . feeding supplement (OSMOLITE 1 CAL) LIQD   Oral   Take 237 mLs by mouth 3 (three) times daily with meals.         . sertraline (ZOLOFT) 25 MG tablet   PEG Tube   25 mg by PEG Tube route daily.         [name] atorvastatin (LIPITOR) 40 MG tablet   PEG Tube   40 mg by PEG Tube route daily.         [name] enoxaparin (LOVENOX) 100 MG/ML injection   Subcutaneous   Inject 100 mg into the skin daily.         [name] LORazepam in dextrose solution   Intravenous   Inject 1 mg into the vein as needed (for agitation).         . polyethylene glycol (MIRALAX / GLYCOLAX) packet   PEG Tube   17 g by PEG Tube route daily as needed (for constipation).         [name]  polyvinyl alcohol (LIQUIFILM TEARS) 1.4 % ophthalmic solution   Both Eyes   Place 1 drop into both eyes as needed (for dry eyes).         . QUEtiapine (SEROQUEL) 25 MG tablet   PEG Tube   25 mg by PEG Tube route at bedtime.         . senna-docusate (SENOKOT-S) 8.6-50 MG per tablet   PEG Tube   2 tablets by PEG Tube route every evening.         . tuberculin 5 UNIT/0.1ML injection   Intradermal   Inject 5 Units into the skin once.         . warfarin (COUMADIN) 5 MG tablet   PEG Tube   5 mg by PEG Tube route daily.          BP 150/91  Pulse 114  Temp(Src) 98.6 F (37 C) (Oral)  Resp 20  SpO2 94% Physical Exam  Nursing note and vitals reviewed. Constitutional: He appears well-developed and well-nourished.  HENT:  Head: Normocephalic and atraumatic.  Cardiovascular: Normal rate, regular rhythm and normal heart sounds.   Pulmonary/Chest: Effort normal and breath sounds normal.  Abdominal: Soft. Normal appearance  and bowel sounds are normal. He exhibits no distension and no mass. There is generalized tenderness. There is no rebound and no guarding.  Genitourinary:  Patient with very watery brownish colored stool with a reddish colored tinge.    Neurological: He is alert.  Skin: Skin is warm and dry.  Psychiatric: He has a normal mood and affect.    ED Course  Procedures (including critical care time) Labs Review Labs Reviewed  CBC WITH DIFFERENTIAL  COMPREHENSIVE METABOLIC PANEL  PROTIME-INR  APTT  OCCULT BLOOD X 1 CARD TO LAB, STOOL  URINALYSIS, ROUTINE W REFLEX MICROSCOPIC  TYPE AND SCREEN   Imaging Review Dg Chest 2 View  04/30/2013   CLINICAL DATA:  Pain and fever  EXAM: CHEST  2 VIEW  COMPARISON:  None.  FINDINGS: There is extensive pneumoperitoneum. There is no edema or consolidation. Heart is upper normal in size with normal pulmonary vascularity. No adenopathy. No bone lesions.  IMPRESSION: Pneumoperitoneum.  No edema or consolidation.  CriticalValue/emergent results were called by telephone at the time of interpretation on 04/30/2013 at 8:18 PMto the emergency department physician covering [facility], who verbally acknowledged these results.   Electronically Signed   By: Bretta Bang   On: 04/30/2013 20:20   Dg Abd 1 View  04/30/2013   CLINICAL DATA:  Check PEG tube placement  EXAM: ABDOMEN - 1 VIEW  COMPARISON:  None.  FINDINGS: Contrast administered via left-sided feeding tube opacifies nondilated small bowel. There are some other high-density areas in the abdomen which are likely within bowel - present in all 4 quadrants. Pneumoperitoneum better seen previously. Nonobstructive bowel gas pattern. No abnormal intra-abdominal mass effect. Clear lung bases. Focally advanced degenerative disc changes at L4-5.  IMPRESSION: 1. Jejunostomy tube injection opacifies small bowel. Small collections of contrast elsewhere in the abdomen are favored intraluminal, which could be confirmed  with CT if there is ongoing suspicion for tube leakage.  2.  Pneumoperitoneum.   Electronically Signed   By: Tiburcio Pea   On: 04/30/2013 23:15   Ct Abdomen Pelvis W Contrast  05/01/2013   *RADIOLOGY REPORT*  Clinical Data: GI bleeding, fever, blood and air  CT ABDOMEN AND PELVIS WITH CONTRAST  Technique:  Multidetector CT imaging of the abdomen and pelvis was performed  following the standard protocol during bolus administration of intravenous contrast.  Contrast: OMNIPAQUE IOHEXOL 300 MG/ML  SOLN  Comparison: CT abdomen pelvis with and without contrast 11/03/2011 and MR abdomen 06/30/2003.  Findings: Lung bases:  There is mild bibasilar atelectasis. Calcified granuloma in the left lower lobe.  Negative for pleural effusion.  Negative for pneumothorax at the lung bases.  There is a moderate amount of pneumoperitoneum, predominately seen in the nondependent portion of the abdomen, with multiple tiny locules scattered throughout the upper abdomen bilaterally.  A pull-through type feeding tube traverses the left anterior abdominal wall and is positioned within a jejunal loop in the left upper quadrant.  Oral contrast was administered via the feeding tube, and by the time the exam was performed, the contrast has progressed throughout the small bowel loops distal to the feeding tube, and into the colon, to the level of the mid sigmoid colon. No free intraperitoneal spill of oral contrast is seen.  The stomach is decompressed.  The duodenum is unremarkable.  No free air is seen posterior to the stomach, or adjacent to the duodenum.  2.3 x 2.7 cm left adrenal gland nodule is stable.  This is previously been evaluated with MRI in 2004, and shown to be most consistent with a benign adenoma.  The patient has a markedly enlarged prostate gland (5.8 centers AP diameter by 6.4 cm transverse diameter by 6.1 cm craniocaudal span. The patient's urinary bladder is distended, and there is mild bilateral  hydroureteronephrosis.  These findings raise the possibility of possible bladder outlet obstruction related to the prostamegaly. There is a single tiny locule of gas in the nondependent portion of the urinary bladder.  Urinary bladder wall is normal in thickness.  There is a soft tissue nodule in the lower anterior abdomen, directly anterior to the anterior wall of the distended urinary bladder there is not deftly present on prior CT. This measures 1.5 x1.8 x 0.7 cm.  There is suggestion of mild urothelial enhancement of both ureters. Suggest correlation with urinalysis to exclude urinary tract infection.  Bilateral low density renal lesions appear without significant change in most consistent with benign cysts.  The liver, gallbladder, spleen, right adrenal gland, and pancreas are within normal limits.  Sigmoid colon diverticulosis is noted, without acute inflammatory change. There are a few locules of gas just inferior to the cecum, and there is some fluid in this region of the right lower quadrant. There is also a small amount of ascites in the dependent portion of the pelvis.  The appendix is normal.  Abdominal aorta normal in caliber.  Negative for lymphadenopathy.  No acute bony abnormalities identified.  No suspicious osseous lesion.  IMPRESSION:  1. Moderate volume of pneumoperitoneum.  No free intraperitoneal spill of contrast or focal gastrointestinal tract wall thickening is identified. A definite etiology for the free intraperitoneal air is not identified.  A pull-through type feeding tube is present within a loop of jejunum in the left upper quadrant.  Consider correlation with operative/procedure note.  This volume of pneumoperitoneum would not be expected  2 weeks after feeding tube placement (reportedly the procedure was performed approximately 2 weeks ago). 2. Marked prostamegaly.  Cannot exclude prostate carcinoma. Suggest correlation with PSA values.  The urinary bladder is markedly distended and  there is mild bilateral hydroureteronephrosis. Findings raise the possibility of bladder outlet obstruction secondary to prostamegaly. 3.  Single locule of gas in the urinary bladder.  Question if there has been recent Foley  catheter placement or other instrumentation to explain this. 4.  Mild urothelial enhancement of the ureters.  Cannot exclude urinary tract infection.  Suggest correlation with urinalysis. 5.  1.5 cm soft tissue nodule in the lower anterior abdomen, directly anterior to the urinary bladder.  This appears new/increased compared to prior studies.  A reactive lymph node or metastatic peritoneal implant cannot be excluded if the patient has an underlying malignancy. 6.  Small volume of pelvic ascites. 7.  Stable left adrenal gland adenoma.   Original Report Authenticated By: Britta Mccreedy, M.D.    9:04 PM Discussed with Dr. Biagio Quint with [facility].  He reports that he will be down to evaluate the patient.  Patient has been evaluated by Dr. Biagio Quint with [facility].  He recommends getting a KUB with contrast to confirm placement of the PEG tube.  He then recommends getting a CT ab/pelvis with contrast.  2:15 AM Results of the CT discussed with Dr. Biagio Quint.  He recommends having the patient admitted to [facility].      MDM  No diagnosis found. Patient presenting from nursing home due to fever and rectal bleeding.  Patient currently on Coumadin due to history of DVT.  Hemoglobin stable.  INR found to be supratherapeutic at 5.49.  Hemoccult positive.  Initial CXR showed extensive pneumoperitoneum.  General Surgery consulted and patient evaluated by Dr. Biagio Quint.  Patient was not having any pain at the time of Dr. Delice Lesch evaluation and INR was supratherapeutic.  Therefore, Dr. Biagio Quint wanted to hold off on surgery.  He recommended getting a CT ab/pelvis with contrast.  CT also showed pneumoperitoneum, but did not show any leak of the contrast.  Dr. Biagio Quint then  again consulted and recommended having the patient admitted to [facility].    Patient also found to have a UTI and started on Rocephin in the ED.    Pascal Lux Treynor, PA-C 05/02/13 1223

## 2013-04-30 NOTE — ED Notes (Signed)
Bed: WA12 Expected date:  Expected time:  Means of arrival:  Comments: EMS 

## 2013-04-30 NOTE — ED Notes (Signed)
To HOLD ON to Fresh Frozen Plasma transfusion per Surgeon Dr. Biagio Quint and heather,PA. until further order. Laboratory notified,spoke to [facility] of Lab.pt. Denies pain ,No s/s  Of distress noted at this time.

## 2013-04-30 NOTE — ED Notes (Signed)
Patient transported to X-ray 

## 2013-04-30 NOTE — ED Notes (Signed)
Per ems: pt from [facility], reports dark red stool and abd pain x1 day. Staff reports fever of 103, gave tylenol prior to ems arrival. Pt had stroke last month, right sided weakness noted. Takes coumadin and aspirin. bp 122/84

## 2013-05-01 ENCOUNTER — Encounter (HOSPITAL_COMMUNITY): Payer: Self-pay

## 2013-05-01 ENCOUNTER — Inpatient Hospital Stay (HOSPITAL_COMMUNITY): Payer: BC Managed Care – PPO

## 2013-05-01 DIAGNOSIS — K922 Gastrointestinal hemorrhage, unspecified: Secondary | ICD-10-CM

## 2013-05-01 DIAGNOSIS — R1310 Dysphagia, unspecified: Secondary | ICD-10-CM | POA: Insufficient documentation

## 2013-05-01 DIAGNOSIS — N029 Recurrent and persistent hematuria with unspecified morphologic changes: Secondary | ICD-10-CM

## 2013-05-01 DIAGNOSIS — Z978 Presence of other specified devices: Secondary | ICD-10-CM

## 2013-05-01 DIAGNOSIS — I6350 Cerebral infarction due to unspecified occlusion or stenosis of unspecified cerebral artery: Secondary | ICD-10-CM

## 2013-05-01 DIAGNOSIS — R791 Abnormal coagulation profile: Secondary | ICD-10-CM | POA: Diagnosis present

## 2013-05-01 DIAGNOSIS — D649 Anemia, unspecified: Secondary | ICD-10-CM | POA: Diagnosis present

## 2013-05-01 DIAGNOSIS — Z934 Other artificial openings of gastrointestinal tract status: Secondary | ICD-10-CM

## 2013-05-01 DIAGNOSIS — N390 Urinary tract infection, site not specified: Secondary | ICD-10-CM | POA: Diagnosis present

## 2013-05-01 DIAGNOSIS — K668 Other specified disorders of peritoneum: Secondary | ICD-10-CM

## 2013-05-01 DIAGNOSIS — I69322 Dysarthria following cerebral infarction: Secondary | ICD-10-CM

## 2013-05-01 DIAGNOSIS — I82A11 Acute embolism and thrombosis of right axillary vein: Secondary | ICD-10-CM | POA: Diagnosis present

## 2013-05-01 DIAGNOSIS — I639 Cerebral infarction, unspecified: Secondary | ICD-10-CM | POA: Insufficient documentation

## 2013-05-01 DIAGNOSIS — S0181XA Laceration without foreign body of other part of head, initial encounter: Secondary | ICD-10-CM

## 2013-05-01 DIAGNOSIS — I69351 Hemiplegia and hemiparesis following cerebral infarction affecting right dominant side: Secondary | ICD-10-CM

## 2013-05-01 HISTORY — DX: Cerebral infarction, unspecified: I63.9

## 2013-05-01 HISTORY — DX: Dysarthria following cerebral infarction: I69.322

## 2013-05-01 HISTORY — DX: Recurrent and persistent hematuria with unspecified morphologic changes: N02.9

## 2013-05-01 LAB — CBC
HCT: 28.8 % — ABNORMAL LOW (ref 39.0–52.0)
HCT: 28.5 % — ABNORMAL LOW (ref 39.0–52.0)
Hemoglobin: 9.3 g/dL — ABNORMAL LOW (ref 13.0–17.0)
MCH: 32.2 pg (ref 26.0–34.0)
MCHC: 32.3 g/dL (ref 30.0–36.0)
MCV: 99.7 fL (ref 78.0–100.0)
MCV: 99.7 fL (ref 78.0–100.0)
RBC: 2.86 MIL/uL — ABNORMAL LOW (ref 4.22–5.81)
RDW: 13.5 % (ref 11.5–15.5)
RDW: 13.3 % (ref 11.5–15.5)
WBC: 15 10*3/uL — ABNORMAL HIGH (ref 4.0–10.5)
WBC: 12.4 10*3/uL — ABNORMAL HIGH (ref 4.0–10.5)

## 2013-05-01 LAB — ABO/RH: ABO/RH(D): A POS

## 2013-05-01 LAB — BASIC METABOLIC PANEL
CO2: 29 mEq/L (ref 19–32)
Calcium: 9.6 mg/dL (ref 8.4–10.5)
Chloride: 114 mEq/L — ABNORMAL HIGH (ref 96–112)
Glucose, Bld: 101 mg/dL — ABNORMAL HIGH (ref 70–99)
Potassium: 3.4 mEq/L — ABNORMAL LOW (ref 3.5–5.1)
Sodium: 151 mEq/L — ABNORMAL HIGH (ref 135–145)

## 2013-05-01 LAB — PROTIME-INR
INR: 3.46 — ABNORMAL HIGH (ref 0.00–1.49)
Prothrombin Time: 57.3 seconds — ABNORMAL HIGH (ref 11.6–15.2)

## 2013-05-01 LAB — MRSA PCR SCREENING: MRSA by PCR: NEGATIVE

## 2013-05-01 MED ORDER — IOHEXOL 300 MG/ML  SOLN
100.0000 mL | Freq: Once | INTRAMUSCULAR | Status: AC | PRN
  Administered 2013-05-01: 100 mL via INTRAVENOUS

## 2013-05-01 MED ORDER — DEXTROSE 5 % IV SOLN
1.0000 g | INTRAVENOUS | Status: DC
  Administered 2013-05-01 – 2013-05-03 (×3): 1 g via INTRAVENOUS
  Filled 2013-05-01 (×4): qty 10

## 2013-05-01 MED ORDER — BIOTENE DRY MOUTH MT LIQD
15.0000 mL | Freq: Two times a day (BID) | OROMUCOSAL | Status: DC
  Administered 2013-05-02 – 2013-05-05 (×7): 15 mL via OROMUCOSAL

## 2013-05-01 MED ORDER — VITAMINS A & D EX OINT
TOPICAL_OINTMENT | CUTANEOUS | Status: AC
  Administered 2013-05-01: 1
  Filled 2013-05-01: qty 5

## 2013-05-01 MED ORDER — METRONIDAZOLE IN NACL 5-0.79 MG/ML-% IV SOLN
500.0000 mg | Freq: Three times a day (TID) | INTRAVENOUS | Status: DC
  Administered 2013-05-01: 500 mg via INTRAVENOUS
  Filled 2013-05-01 (×2): qty 100

## 2013-05-01 MED ORDER — IOHEXOL 300 MG/ML  SOLN
50.0000 mL | Freq: Once | INTRAMUSCULAR | Status: AC | PRN
  Administered 2013-05-01: 50 mL via ORAL

## 2013-05-01 MED ORDER — ACETAMINOPHEN 325 MG PO TABS: 650.0000 mg | ORAL_TABLET | Freq: Four times a day (QID) | ORAL | Status: DC | PRN

## 2013-05-01 MED ORDER — INFLUENZA VAC SPLIT QUAD 0.5 ML IM SUSP
0.5000 mL | INTRAMUSCULAR | Status: AC
  Administered 2013-05-02: 0.5 mL via INTRAMUSCULAR
  Filled 2013-05-01 (×2): qty 0.5

## 2013-05-01 MED ORDER — PHENOL 1.4 % MT LIQD
2.0000 | OROMUCOSAL | Status: DC | PRN
  Filled 2013-05-01: qty 177

## 2013-05-01 MED ORDER — ACETAMINOPHEN 650 MG RE SUPP
650.0000 mg | Freq: Four times a day (QID) | RECTAL | Status: DC | PRN
  Filled 2013-05-01: qty 1

## 2013-05-01 MED ORDER — CHLORHEXIDINE GLUCONATE 0.12 % MT SOLN
15.0000 mL | Freq: Two times a day (BID) | OROMUCOSAL | Status: DC
  Administered 2013-05-01 – 2013-05-05 (×7): 15 mL via OROMUCOSAL
  Filled 2013-05-01 (×10): qty 15

## 2013-05-01 MED ORDER — SODIUM CHLORIDE 0.9 % IV SOLN
INTRAVENOUS | Status: DC
  Administered 2013-05-01: 1000 mL via INTRAVENOUS
  Administered 2013-05-01: 11:00:00 via INTRAVENOUS

## 2013-05-01 MED ORDER — POTASSIUM CHLORIDE 20 MEQ/15ML (10%) PO LIQD
40.0000 meq | Freq: Once | ORAL | Status: DC
  Filled 2013-05-01: qty 30

## 2013-05-01 MED ORDER — BISACODYL 10 MG RE SUPP: 10.0000 mg | Freq: Two times a day (BID) | RECTAL | Status: DC | PRN

## 2013-05-01 MED ORDER — DEXTROSE 5 % IV SOLN
INTRAVENOUS | Status: DC
  Administered 2013-05-01 – 2013-05-02 (×2): via INTRAVENOUS

## 2013-05-01 MED ORDER — PNEUMOCOCCAL VAC POLYVALENT 25 MCG/0.5ML IJ INJ
0.5000 mL | INJECTION | INTRAMUSCULAR | Status: AC
  Administered 2013-05-02: 0.5 mL via INTRAMUSCULAR
  Filled 2013-05-01 (×2): qty 0.5

## 2013-05-01 MED ORDER — SODIUM CHLORIDE 0.45 % IV SOLN
INTRAVENOUS | Status: DC
  Administered 2013-05-01: 14:00:00 via INTRAVENOUS

## 2013-05-01 MED ORDER — MAGIC MOUTHWASH
15.0000 mL | Freq: Four times a day (QID) | ORAL | Status: DC | PRN
  Filled 2013-05-01: qty 15

## 2013-05-01 MED ORDER — LIP MEDEX EX OINT
1.0000 "application " | TOPICAL_OINTMENT | Freq: Two times a day (BID) | CUTANEOUS | Status: DC
  Administered 2013-05-01 – 2013-05-05 (×9): 1 via TOPICAL
  Filled 2013-05-01: qty 7

## 2013-05-01 MED ORDER — PANTOPRAZOLE SODIUM 40 MG IV SOLR
40.0000 mg | Freq: Two times a day (BID) | INTRAVENOUS | Status: DC
  Administered 2013-05-01 – 2013-05-05 (×9): 40 mg via INTRAVENOUS
  Filled 2013-05-01 (×10): qty 40

## 2013-05-01 NOTE — H&P (Addendum)
[facility] History and Physical  [name] [name] ZOX:[id number] DOB: [birth year] DOA: 04/30/2013  Referring physician: ED PCP: No primary provider on file.   Chief Complaint:  sent from NH for  fever and rectal bleed x1 day  HPI:  57 year old male with history of hypertension, history of DVT on Coumadin who was admitted to [facility] recently with an acute stroke involving a large area on the left side with residual right hemiplegia and dysphasia was discharged to skilled nursing facility. He had a PEG tube placed in the hospital due to dysphagia. Patient sent from there as he had a fever with one episode of bright red blood per rectum on the day of admission. History primarily provided by brother at bedside and from ED notes. Patient was noted to have a blighted blood mixed with stool by the nursing home staff. Mother also reports the patient was complaining of some abdominal pain for the last 1 week. Patient was noted to have a fever of 103F in the nursing home and with this into the ED. Patient did not have any chills, nausea or vomiting. Did not have any chest pain, dizziness, palpitation, shortness of breath, new weakness, muscle or joint pains. Denies any diarrhea dysuria or hematuria.  Course in the ED Patient's vitals were stable and he was afebrile. Blood work showed leukocytosis and anemia with hemoglobin of 10.3 (hemoglobin upon discharge from [facility] was 11) patient noted to have mild hypernatremia and elevated BUN with supratherapeutic INR of 5.49. A chest x-ray done showed extensive pneumoperitoneum. This was followed by a CT scan of the abdomen and pelvis with contrast which showed extensive pneumoperitoneum. Also commented on enlarged prostate lead mild bilateral hydronephrosis. Patient has a Foley catheter in place which is draining good amount of urine. General surgery consult was called who recommended evaluating position of the G-tube and following up with CT scan of the  abdomen to evaluate further. The G-tube was seen between the jejunal loop in the left upper quadrant. It Oral contrast administered via the feeding tube passed further down into the small bowel and into the colon with no free intraperitoneal spill of the contrast seen.  [facility] called for  admission to telemetry  Review of Systems:  As outlined in history of present illness. History limited due to patient's aphasia   Past Medical History  Diagnosis Date  . Stroke   . Dysphagia   . DVT (deep venous thrombosis)   . Aphasia   . Hypertension   . Depression    No past surgical history on file. Social History:  reports that he has never smoked. He does not have any smokeless tobacco history on file. He reports that he does not drink alcohol or use illicit drugs.  No Known Allergies  No family history on file.  Prior to Admission medications   Medication Sig Start Date End Date Taking? Authorizing Provider  acetaminophen (TYLENOL) 160 MG/5ML liquid Take 640 mg by mouth every 4 (four) hours as needed for fever.    Yes Historical Provider, MD  aspirin 81 MG chewable tablet 81 mg by PEG Tube route daily.   Yes Historical Provider, MD  atorvastatin (LIPITOR) 40 MG tablet 40 mg by PEG Tube route daily.   Yes Historical Provider, MD  feeding supplement (OSMOLITE 1 CAL) LIQD Take 237 mLs by mouth 3 (three) times daily with meals.   Yes Historical Provider, MD  QUEtiapine (SEROQUEL) 25 MG tablet 25 mg by PEG Tube route at bedtime.  Yes Historical Provider, MD  senna-docusate (SENOKOT-S) 8.6-50 MG per tablet 2 tablets by PEG Tube route every evening.   Yes Historical Provider, MD  sertraline (ZOLOFT) 25 MG tablet 25 mg by PEG Tube route daily.   Yes Historical Provider, MD  warfarin (COUMADIN) 5 MG tablet 5 mg by PEG Tube route daily.   Yes Historical Provider, MD  enoxaparin (LOVENOX) 100 MG/ML injection Inject 100 mg into the skin daily.    Historical Provider, MD    Physical  Exam:  Filed Vitals:   04/30/13 1834 04/30/13 2054 05/01/13 0228 05/01/13 0415  BP: 150/91 125/82 133/94 115/76  Pulse: 114 91 116 96  Temp: 98.6 F (37 C) 98.3 F (36.8 C)  97.5 F (36.4 C)  TempSrc: Oral Oral  Oral  Resp: 20 16 18 20   Height:    5\' 11"  (1.803 m)  Weight:    83.5 kg (184 lb 1.4 oz)  SpO2: 94% 96% 94% 96%    Constitutional: Vital signs reviewed.  Patient is middle aged male lying in bed in no acute distress HEENT: No pallor, vital mucosa, no cervical lymphadenopathy  Cardiovascular: RRR, S1 normal, S2 normal, no MRG, Pulmonary/Chest: CTAB, no wheezes, rales, or rhonchi Abdominal: Soft. Non-tender, non-distended, bowel sounds are normal, G-tube in place . Foley in place Charity: Warm, no edema CNS: AAO x3, aphasic, right hemiplegia   Labs on Admission:  Basic Metabolic Panel:  Recent Labs Lab 04/26/13 0547 04/30/13 2005  NA 141 148*  K 4.0 3.7  CL 103 110  CO2 26 29  GLUCOSE 133* 124*  BUN 35* 27*  CREATININE 1.83* 1.29  CALCIUM 9.7 9.3   Liver Function Tests:  Recent Labs Lab 04/30/13 2005  AST 33  ALT 104*  ALKPHOS 54  BILITOT 0.6  PROT 7.0  ALBUMIN 2.4*   No results found for this basename: LIPASE, AMYLASE,  in the last 168 hours No results found for this basename: AMMONIA,  in the last 168 hours CBC:  Recent Labs Lab 04/26/13 0547 04/30/13 2005  WBC 11.0* 15.5*  NEUTROABS  --  12.8*  HGB 11.9* 10.3*  HCT 36.5* 32.0*  MCV 98.9 99.4  PLT 168 214   Cardiac Enzymes: No results found for this basename: CKTOTAL, CKMB, CKMBINDEX, TROPONINI,  in the last 168 hours BNP: No components found with this basename: POCBNP,  CBG: No results found for this basename: GLUCAP,  in the last 168 hours  Radiological Exams on Admission: Dg Chest 2 View  04/30/2013   CLINICAL DATA:  Pain and fever  EXAM: CHEST  2 VIEW  COMPARISON:  None.  FINDINGS: There is extensive pneumoperitoneum. There is no edema or consolidation. Heart is upper normal in  size with normal pulmonary vascularity. No adenopathy. No bone lesions.  IMPRESSION: Pneumoperitoneum.  No edema or consolidation.  CriticalValue/emergent results were called by telephone at the time of interpretation on 04/30/2013 at 8:18 PMto the emergency department physician covering [facility], who verbally acknowledged these results.   Electronically Signed   By: Bretta Bang   On: 04/30/2013 20:20   Dg Abd 1 View  04/30/2013   CLINICAL DATA:  Check PEG tube placement  EXAM: ABDOMEN - 1 VIEW  COMPARISON:  None.  FINDINGS: Contrast administered via left-sided feeding tube opacifies nondilated small bowel. There are some other high-density areas in the abdomen which are likely within bowel - present in all 4 quadrants. Pneumoperitoneum better seen previously. Nonobstructive bowel gas pattern. No abnormal intra-abdominal  mass effect. Clear lung bases. Focally advanced degenerative disc changes at L4-5.  IMPRESSION: 1. Jejunostomy tube injection opacifies small bowel. Small collections of contrast elsewhere in the abdomen are favored intraluminal, which could be confirmed with CT if there is ongoing suspicion for tube leakage.  2.  Pneumoperitoneum.   Electronically Signed   By: Tiburcio Pea   On: 04/30/2013 23:15   Ct Abdomen Pelvis W Contrast  05/01/2013   *RADIOLOGY REPORT*  Clinical Data: GI bleeding, fever, blood and air  CT ABDOMEN AND PELVIS WITH CONTRAST  Technique:  Multidetector CT imaging of the abdomen and pelvis was performed following the standard protocol during bolus administration of intravenous contrast.  Contrast: OMNIPAQUE IOHEXOL 300 MG/ML  SOLN  Comparison: CT abdomen pelvis with and without contrast 11/03/2011 and MR abdomen 06/30/2003.  Findings: Lung bases:  There is mild bibasilar atelectasis. Calcified granuloma in the left lower lobe.  Negative for pleural effusion.  Negative for pneumothorax at the lung bases.  There is a moderate amount of  pneumoperitoneum, predominately seen in the nondependent portion of the abdomen, with multiple tiny locules scattered throughout the upper abdomen bilaterally.  A pull-through type feeding tube traverses the left anterior abdominal wall and is positioned within a jejunal loop in the left upper quadrant.  Oral contrast was administered via the feeding tube, and by the time the exam was performed, the contrast has progressed throughout the small bowel loops distal to the feeding tube, and into the colon, to the level of the mid sigmoid colon. No free intraperitoneal spill of oral contrast is seen.  The stomach is decompressed.  The duodenum is unremarkable.  No free air is seen posterior to the stomach, or adjacent to the duodenum.  2.3 x 2.7 cm left adrenal gland nodule is stable.  This is previously been evaluated with MRI in 2004, and shown to be most consistent with a benign adenoma.  The patient has a markedly enlarged prostate gland (5.8 centers AP diameter by 6.4 cm transverse diameter by 6.1 cm craniocaudal span. The patient's urinary bladder is distended, and there is mild bilateral hydroureteronephrosis.  These findings raise the possibility of possible bladder outlet obstruction related to the prostamegaly. There is a single tiny locule of gas in the nondependent portion of the urinary bladder.  Urinary bladder wall is normal in thickness.  There is a soft tissue nodule in the lower anterior abdomen, directly anterior to the anterior wall of the distended urinary bladder there is not deftly present on prior CT. This measures 1.5 x1.8 x 0.7 cm.  There is suggestion of mild urothelial enhancement of both ureters. Suggest correlation with urinalysis to exclude urinary tract infection.  Bilateral low density renal lesions appear without significant change in most consistent with benign cysts.  The liver, gallbladder, spleen, right adrenal gland, and pancreas are within normal limits.  Sigmoid colon  diverticulosis is noted, without acute inflammatory change. There are a few locules of gas just inferior to the cecum, and there is some fluid in this region of the right lower quadrant. There is also a small amount of ascites in the dependent portion of the pelvis.  The appendix is normal.  Abdominal aorta normal in caliber.  Negative for lymphadenopathy.  No acute bony abnormalities identified.  No suspicious osseous lesion.  IMPRESSION:  1. Moderate volume of pneumoperitoneum.  No free intraperitoneal spill of contrast or focal gastrointestinal tract wall thickening is identified. A definite etiology for the free intraperitoneal air  is not identified.  A pull-through type feeding tube is present within a loop of jejunum in the left upper quadrant.  Consider correlation with operative/procedure note.  This volume of pneumoperitoneum would not be expected  2 weeks after feeding tube placement (reportedly the procedure was performed approximately 2 weeks ago). 2. Marked prostamegaly.  Cannot exclude prostate carcinoma. Suggest correlation with PSA values.  The urinary bladder is markedly distended and there is mild bilateral hydroureteronephrosis. Findings raise the possibility of bladder outlet obstruction secondary to prostamegaly. 3.  Single locule of gas in the urinary bladder.  Question if there has been recent Foley catheter placement or other instrumentation to explain this. 4.  Mild urothelial enhancement of the ureters.  Cannot exclude urinary tract infection.  Suggest correlation with urinalysis. 5.  1.5 cm soft tissue nodule in the lower anterior abdomen, directly anterior to the urinary bladder.  This appears new/increased compared to prior studies.  A reactive lymph node or metastatic peritoneal implant cannot be excluded if the patient has an underlying malignancy. 6.  Small volume of pelvic ascites. 7.  Stable left adrenal gland adenoma.   Original Report Authenticated By: Britta Mccreedy, M.D.       Assessment/Plan  Principle problem bright blood per rectum Possibly diverticular bleed in the setting of supratherapeutic INR. Holding Coumadin. 1 unit FFP ordered in the ED. Monitor INR and serial H&H. Hemoglobin appears at baseline and does not need transfusion at this time . Keep patient n.p.o. IV PPI twice a day.  I will request GI evaluation in the morning  Active Problems:   UTI (urinary tract infection) UA suggesting UTI. Chest x-ray unremarkable. Follow urine culture. I will order for a blood culture. We'll place him empirically on Rocephin. Hydrate with IV fluids     Pneumoperitoneum On evaluating records from [facility] patient did have small areas of pneumoperitoneum on the second day after having the G-tube placed. Seen by general surgery and recommend conservative management given the lack of active symptoms. We'll keep patient n.p.o. for now with serial bowel exam. If no further symptoms and pneumoperitoneum resolving on repeat abdominal x-ray plan to resume his diet.   Recent CVA Has a large left sided stroke with residual right hemiplegia and dysphagia Holding medications currently .   Hypertension Blood pressure stable   Diet: N.p.o. DVT prophylaxis: INR supratherapeutic  Code Status: Full code Family Communication: Brother at bedside Disposition Plan: Currently inpatient  Eddie [place] [facility] Pager [phone]  If 7PM-7AM, please contact night-coverage [email or web address] Password [facility] 05/01/2013, 4:42 AM  Total time spent on admission: 70 minutes

## 2013-05-01 NOTE — Progress Notes (Signed)
[facility] PROGRESS NOTE  Finnbar Cedillos ZOX:[id number] DOB: [birth year] DOA: 04/30/2013 PCP: No primary provider on file.  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 He was transferred to the ED on 05/01/13 due to fever (103F) and one episode of bright red bleeding per act him on day of admission. In the ED, hemoglobin 10.3 (at discharge from [facility] was 11), WBC 15.5, Na 148, INR 5.49, chest x-ray: Extensive pneumoperitoneum, CT abdomen and pelvis with contrast: Extensive pneumoperitoneum, enlarged prostate and mild bilateral hydronephrosis. General surgery and GI were consulted. The hospitalist admission requested.    Assessment/Plan:  Rectal bleeding - In the setting of supratherapeutic INR - Witnessed stools this morning were loose and dark without frank red blood. - Unclear if this is due to the same process causing the pneumoperitoneum or a separate issue. - GI consultation appreciated. - Continue IV PPI, IV fluids and tube feeds on hold. - Follow serial H&H's and transfuse as indicated. - Coumadin held. Continue to reverse INR with FFP and follow INR. - GI will decide regarding need for EGD.  UTI - DC Foley catheter - Continue IV Rocephin pending urine culture results.  Pneumoperitoneum - Currently asymptomatic. No evidence of shock or peritonitis. - Could be secondary to recent tube placement (POD #19) versus evaluate for perforated hollow viscus. - Continue conservative management with IV antibiotics, hold tube feeds and monitor. - Surgery and GI consultation appreciated. Requesting UGI via NG tube to rule out gastric leak-if positive for leak, to OR after coagulopathy corrected.  Anemia - Hemoglobin drop  of about 1.5 g-? Related to GI bleed - Follow H&H closely and transfuse as needed.  Leukocytosis - Secondary to UTI and stress response. - Follow CBC.   Prostatomegaly/urinary retention/mild bilateral hydronephrosis - Continue Foley catheter for now - Outpatient workup i.e. PSA etc. Including urology consultation  Recent large left CVA/right hemiplegia/dysphagia/expressive aphasia - Holding medications secondary to GI bleed.  Hypertension - Stable.  DVT/coagulopathy secondary to Coumadin - Management as above.  DVT prophylaxis: SCDs. Currently coagulopathic. Lines/catheters: PIV. Foley Nutrition: N.p.o. Tube feeds held.  Activity:  Up with assistance Code Status: Full Family Communication: None Disposition Plan: Return to SNF when medically stable.   Consultants:  General surgery  Gastroenterology  Procedures:  Foley catheter  Has PEG tube prior to admission.  Antibiotics:  IV Rocephin 9/21 >   Subjective: Denies complaints. Denies abdominal pain. Witnessed soft dark BM without red blood.  Objective: Filed Vitals:   05/01/13 0850 05/01/13 0920 05/01/13 1020 05/01/13 1103  BP: 127/70 127/73 130/72 121/75  Pulse: 95 92 90 82  Temp: 99.7 F (37.6 C) 99.9 F (37.7 C) 97.5 F (36.4 C) 97.8 F (36.6 C)  TempSrc: Oral Oral Oral Oral  Resp: 20 18 18 20   Height:      Weight:      SpO2: 95% 96% 95% 98%    Intake/Output Summary (Last 24 hours) at 05/01/13 1207 Last data filed at 05/01/13 1103  Gross per 24 hour  Intake 810.83 ml  Output   1150 ml  Net -339.17 ml   Filed Weights   05/01/13 0415  Weight: 83.5 kg (184 lb 1.4 oz)     Exam:  General exam: Moderately built and nourished male patient lying comfortably supine  in bed. Respiratory system: Clear. No increased work of breathing. Cardiovascular system: S1 & S2 heard, RRR. No JVD, murmurs, gallops, clicks or pedal edema. Gastrointestinal system: Abdomen is nondistended, soft and nontender.  Normal bowel sounds heard. Tube site unremarkable. Central nervous system: Alert and oriented. Facial asymmetry +. Expressive aphasia. Extremities: 5 x 5 power left extremities. 0 x 5 power right extremities.   Data Reviewed: Basic Metabolic Panel:  Recent Labs Lab 04/26/13 0547 04/30/13 2005  NA 141 148*  K 4.0 3.7  CL 103 110  CO2 26 29  GLUCOSE 133* 124*  BUN 35* 27*  CREATININE 1.83* 1.29  CALCIUM 9.7 9.3   Liver Function Tests:  Recent Labs Lab 04/30/13 2005  AST 33  ALT 104*  ALKPHOS 54  BILITOT 0.6  PROT 7.0  ALBUMIN 2.4*   No results found for this basename: LIPASE, AMYLASE,  in the last 168 hours No results found for this basename: AMMONIA,  in the last 168 hours CBC:  Recent Labs Lab 04/26/13 0547 04/30/13 2005 05/01/13 0507  WBC 11.0* 15.5* 15.0*  NEUTROABS  --  12.8*  --   HGB 11.9* 10.3* 9.3*  HCT 36.5* 32.0* 28.8*  MCV 98.9 99.4 99.7  PLT 168 214 204   Cardiac Enzymes: No results found for this basename: CKTOTAL, CKMB, CKMBINDEX, TROPONINI,  in the last 168 hours BNP (last 3 results) No results found for this basename: PROBNP,  in the last 8760 hours CBG: No results found for this basename: GLUCAP,  in the last 168 hours  Recent Results (from the past 240 hour(s))  MRSA PCR SCREENING     Status: None   Collection Time    05/01/13  6:39 AM      Result Value Range Status   MRSA by PCR NEGATIVE  NEGATIVE Final   Comment:            The GeneXpert MRSA Assay (FDA     approved for NASAL specimens     only), is one component of a     comprehensive MRSA colonization     surveillance program. It is not     intended to diagnose MRSA     infection nor to guide or     monitor treatment for     MRSA infections.      Additional labs: 1. INR: 6.98      Studies: Dg Chest 2 View  04/30/2013   CLINICAL DATA:  Pain and fever  EXAM: CHEST  2 VIEW  COMPARISON:  None.  FINDINGS: There is extensive pneumoperitoneum. There is no edema or  consolidation. Heart is upper normal in size with normal pulmonary vascularity. No adenopathy. No bone lesions.  IMPRESSION: Pneumoperitoneum.  No edema or consolidation.  CriticalValue/emergent results were called by telephone at the time of interpretation on 04/30/2013 at 8:18 PMto the emergency department physician covering [facility], who verbally acknowledged these results.   Electronically Signed   By: Bretta Bang   On: 04/30/2013 20:20   Dg Abd 1 View  04/30/2013   CLINICAL DATA:  Check PEG tube placement  EXAM: ABDOMEN - 1 VIEW  COMPARISON:  None.  FINDINGS: Contrast administered via left-sided feeding tube opacifies nondilated small bowel. There are some other high-density areas in the abdomen which are likely within bowel - present in all 4 quadrants. Pneumoperitoneum better seen previously. Nonobstructive bowel gas pattern. No abnormal intra-abdominal mass effect. Clear lung bases. Focally advanced degenerative disc changes at L4-5.  IMPRESSION: 1. Jejunostomy tube injection opacifies small bowel. Small collections of contrast elsewhere in the abdomen are favored intraluminal, which could be confirmed with CT if there is ongoing suspicion for tube leakage.  2.  Pneumoperitoneum.   Electronically Signed   By: Tiburcio Pea   On: 04/30/2013 23:15   Ct Abdomen Pelvis W Contrast  05/01/2013   *RADIOLOGY REPORT*  Clinical Data: GI bleeding, fever, blood and air  CT ABDOMEN AND PELVIS WITH CONTRAST  Technique:  Multidetector CT imaging of the abdomen and pelvis was performed following the standard protocol during bolus administration of intravenous contrast.  Contrast: OMNIPAQUE IOHEXOL 300 MG/ML  SOLN  Comparison: CT abdomen pelvis with and without contrast 11/03/2011 and MR abdomen 06/30/2003.  Findings: Lung bases:  There is mild bibasilar atelectasis. Calcified granuloma in the left lower lobe.  Negative for pleural effusion.  Negative for pneumothorax at the lung bases.  There  is a moderate amount of pneumoperitoneum, predominately seen in the nondependent portion of the abdomen, with multiple tiny locules scattered throughout the upper abdomen bilaterally.  A pull-through type feeding tube traverses the left anterior abdominal wall and is positioned within a jejunal loop in the left upper quadrant.  Oral contrast was administered via the feeding tube, and by the time the exam was performed, the contrast has progressed throughout the small bowel loops distal to the feeding tube, and into the colon, to the level of the mid sigmoid colon. No free intraperitoneal spill of oral contrast is seen.  The stomach is decompressed.  The duodenum is unremarkable.  No free air is seen posterior to the stomach, or adjacent to the duodenum.  2.3 x 2.7 cm left adrenal gland nodule is stable.  This is previously been evaluated with MRI in 2004, and shown to be most consistent with a benign adenoma.  The patient has a markedly enlarged prostate gland (5.8 centers AP diameter by 6.4 cm transverse diameter by 6.1 cm craniocaudal span. The patient's urinary bladder is distended, and there is mild bilateral hydroureteronephrosis.  These findings raise the possibility of possible bladder outlet obstruction related to the prostamegaly. There is a single tiny locule of gas in the nondependent portion of the urinary bladder.  Urinary bladder wall is normal in thickness.  There is a soft tissue nodule in the lower anterior abdomen, directly anterior to the anterior wall of the distended urinary bladder there is not deftly present on prior CT. This measures 1.5 x1.8 x 0.7 cm.  There is suggestion of mild urothelial enhancement of both ureters. Suggest correlation with urinalysis to exclude urinary tract infection.  Bilateral low density renal lesions appear without significant change in most consistent with benign cysts.  The liver, gallbladder, spleen, right adrenal gland, and pancreas are within normal limits.   Sigmoid colon diverticulosis is noted, without acute inflammatory change. There are a few locules of gas just inferior to the cecum, and there is some fluid in this region of the right lower quadrant. There is also a small amount of ascites in the dependent portion of the pelvis.  The appendix is normal.  Abdominal aorta normal in caliber.  Negative for lymphadenopathy.  No acute bony abnormalities identified.  No suspicious osseous lesion.  IMPRESSION:  1. Moderate volume of pneumoperitoneum.  No free intraperitoneal spill of contrast or focal gastrointestinal tract wall thickening is identified. A definite etiology for the free intraperitoneal air is not identified.  A pull-through type feeding tube is present within a  loop of jejunum in the left upper quadrant.  Consider correlation with operative/procedure note.  This volume of pneumoperitoneum would not be expected  2 weeks after feeding tube placement (reportedly the procedure was performed approximately 2 weeks ago). 2. Marked prostamegaly.  Cannot exclude prostate carcinoma. Suggest correlation with PSA values.  The urinary bladder is markedly distended and there is mild bilateral hydroureteronephrosis. Findings raise the possibility of bladder outlet obstruction secondary to prostamegaly. 3.  Single locule of gas in the urinary bladder.  Question if there has been recent Foley catheter placement or other instrumentation to explain this. 4.  Mild urothelial enhancement of the ureters.  Cannot exclude urinary tract infection.  Suggest correlation with urinalysis. 5.  1.5 cm soft tissue nodule in the lower anterior abdomen, directly anterior to the urinary bladder.  This appears new/increased compared to prior studies.  A reactive lymph node or metastatic peritoneal implant cannot be excluded if the patient has an underlying malignancy. 6.  Small volume of pelvic ascites. 7.  Stable left adrenal gland adenoma.   Original Report Authenticated By: Britta Mccreedy,  M.D.        Scheduled Meds: . cefTRIAXone (ROCEPHIN)  IV  1 g Intravenous Q24H  . lip balm  1 application Topical BID  . pantoprazole (PROTONIX) IV  40 mg Intravenous Q12H   Continuous Infusions: . sodium chloride 75 mL/hr at 05/01/13 1106    Principal Problem:   Pneumoperitoneum Active Problems:   Lower GI bleed   UTI (urinary tract infection)   Supratherapeutic INR   Chronic indwelling Foley catheter   Jejunostomy tube present (?originally G tube?)   Laceration of eyebrow s/p repair 04/26/2013   Hemiparesis affecting right side as late effect of stroke   Dysarthria as late effect of stroke   DVT of right axillary vein, acute    Time spent: 40 minutes.    [facility]  [facility] Pager [phone].   If 8PM-8AM, please contact night-coverage at [email or web address], password [facility] 05/01/2013, 12:07 PM  LOS: 1 day

## 2013-05-01 NOTE — Progress Notes (Signed)
Upper GI done through nasogastric tube.  Discussed with Dr. Bonnielee Haff with interventional radiology.  The tip of the feeding tube rests within the jejunum with a gastrojejunal fistula.  It is all contained.  No leak.  No obstruction.  In the absence of peritonitis or active leak, I would treat the tube has a regular feeding tube and go ahead and proceed with tube feedings & medicines as needed.  No need for surgery at this time.  Pneumoperitoneum is old and should resolve on its own.  I would hold off on revising the tube in this patient with recent stroke and DVT requiring full anticoagulation.  Agree with correcting supratherapeutic INR.  Hopefully the gastrointestinal bleeding will spontaneously resolve once the INR is corrected.

## 2013-05-01 NOTE — Consult Note (Signed)
Referring Provider: tRIAD HOSPITALIST Primary Care Physician:  No primary provider on file. Primary Gastroenterologist:  Dr. Loreta Ave ?  Reason for Consultation:  Rectal bleeding, and pneumoperitoneum  HPI: [name] [name] is a 57 y.o. male with history of hypertension and DVT for which she is on Coumadin. Patient had a recent admission to [facility] approximately a month ago with a large CVA with resultant right hemiplegia, neurogenic dysphagia and expressive aphasia. He had a PEG tube placed approximately 2 weeks ago per the notes and was discharged to a nursing facility.[name] He was sent to the emergency room last evening because of development of fever 103 and had had a bowel movement with bright red blood. Patient is a poor historian secondary to the aphasia but is able to express that he is not having any abdominal pain and had not been having any abdominal pain over the past few days. No nausea or vomiting. He has been tolerating tube feedings at the nursing home, any rectal bleeding until the one episode yesterday. He denies any rectal pain or hemorrhoidal symptoms. Epic record shows that he has outpatient visits with Dr. Loreta Ave however the patient does not recall having a prior colonoscopy.. Labs on admission showed benign R. above 5 and this morning his INR was 6.98. He does have a leukocytosis with WBC of 15,000. Is also mildly anemic. Apparently his hemoglobin was 11 on discharge from [facility], was 11.9 approximately 5 days ago and is 9.3 this morning. CT scan of the abdomen and pelvis done on admission shows a moderate pneumoperitoneum with multiple tiny lock tools throughout the abdomen. The feeding tube is located in a loop of jejunum and there is no leakage of contrast. There is no fluid or inflammatory change surrounding the stomach or the duodenum, he does have diverticulosis without evidence of diverticulitis and has a few lock joules of air adjacent to the cecum and a small amount of fluid  in that area as well. He has been started on Rocephin. He had surgery consult through the emergency room and since his abdomen is entirely benign decision has been made to observe and continue on IV antibiotics.   After I had examined the patient, he has had a bowel movement which is very dark brownish black but no frank blood or clots   Past Medical History  Diagnosis Date  . Stroke   . Dysphagia   . DVT (deep venous thrombosis)   . Aphasia   . Hypertension   . Depression     No past surgical history on file.  Prior to Admission medications   Medication Sig Start Date End Date Taking? Authorizing Provider  acetaminophen (TYLENOL) 160 MG/5ML liquid Take 640 mg by mouth every 4 (four) hours as needed for fever.    Yes Historical Provider, MD  aspirin 81 MG chewable tablet 81 mg by PEG Tube route daily.   Yes Historical Provider, MD  atorvastatin (LIPITOR) 40 MG tablet 40 mg by PEG Tube route daily.   Yes Historical Provider, MD  feeding supplement (OSMOLITE 1 CAL) LIQD Take 237 mLs by mouth 3 (three) times daily with meals.   Yes Historical Provider, MD  QUEtiapine (SEROQUEL) 25 MG tablet 25 mg by PEG Tube route at bedtime.   Yes Historical Provider, MD  senna-docusate (SENOKOT-S) 8.6-50 MG per tablet 2 tablets by PEG Tube route every evening.   Yes Historical Provider, MD  sertraline (ZOLOFT) 25 MG tablet 25 mg by PEG Tube route daily.   Yes  Historical Provider, MD  warfarin (COUMADIN) 5 MG tablet 5 mg by PEG Tube route daily.   Yes Historical Provider, MD  enoxaparin (LOVENOX) 100 MG/ML injection Inject 100 mg into the skin daily.    Historical Provider, MD    Current Facility-Administered Medications  Medication Dose Route Frequency Provider Last Rate Last Dose  . 0.9 %  sodium chloride infusion   Intravenous Continuous Nishant Dhungel, MD 75 mL/hr at 05/01/13 0445 1,000 mL at 05/01/13 0445  . acetaminophen (TYLENOL) tablet 650 mg  650 mg Oral Q6H PRN Nishant Dhungel, MD        Or  . acetaminophen (TYLENOL) suppository 650 mg  650 mg Rectal Q6H PRN Nishant Dhungel, MD      . cefTRIAXone (ROCEPHIN) 1 g in dextrose 5 % 50 mL IVPB  1 g Intravenous Q24H Nishant Dhungel, MD      . pantoprazole (PROTONIX) injection 40 mg  40 mg Intravenous Q12H Nishant Dhungel, MD   40 mg at 05/01/13 0900    Allergies as of 04/30/2013  . (No Known Allergies)    No family history on file.  History   Social History  . Marital Status: Married    Spouse Name: N/A    Number of Children: N/A  . Years of Education: N/A   Occupational History  . Not on file.   Social History Main Topics  . Smoking status: Never Smoker   . Smokeless tobacco: Not on file  . Alcohol Use: No  . Drug Use: No  . Sexual Activity: Yes   Other Topics Concern  . Not on file   Social History Narrative  . No narrative on file    Review of Systems: Pertinent positive and negative review of systems were noted in the above HPI section.  All other review of systems was otherwise negative.[name]  Physical Exam: Vital signs in last 24 hours: Temp:  [97.5 F (36.4 C)-99.9 F (37.7 C)] 99.9 F (37.7 C) (09/21 0920) Pulse Rate:  [91-116] 92 (09/21 0920) Resp:  [16-20] 18 (09/21 0920) BP: (115-150)/(62-94) 127/73 mmHg (09/21 0920) SpO2:  [94 %-98 %] 96 % (09/21 0920) Weight:  [184 lb 1.4 oz (83.5 kg)] 184 lb 1.4 oz (83.5 kg) (09/21 0415)   General:   Alert,  Well-developed, AA male in NAD-expressive aphasia Head:  Normocephalic and atraumatic. Eyes:  Sclera clear, no icterus.   Conjunctiva pink. Ears:  Normal auditory acuity. Nose:  No deformity, discharge,  or lesions. Mouth:  No deformity or lesions,poor oral care/dentition   Neck:  Supple; no masses or thyromegaly. Lungs:  Clear throughout to auscultation.   No wheezes, crackles, or rhonchi. Heart:  Regular rate and rhythm; no murmurs, clicks, rubs,  or gallops. Abdomen:  Soft,nontender, BS active,nonpalp mass or hsm-PEG tube site benign   Rectal:   Deferred  Msk:  Symmetrical without gross deformities. . Pulses:  Normal pulses noted. Extremities:  Without clubbing or edema. Neurologic:  Alert and  oriented ;  Right hemiparesis,expressive aphasia Psych:  Alert and cooperative. Intake/Output from previous day: 09/20 0701 - 09/21 0700 In: 90 [I.V.:77.5; Blood:12.5] Out: 1150 [Urine:1150] Intake/Output this shift: Total I/O In: 365 [Blood:365] Out: -   Lab Results:  Recent Labs  04/30/13 2005 05/01/13 0507  WBC 15.5* 15.0*  HGB 10.3* 9.3*  HCT 32.0* 28.8*  PLT 214 204   BMET  Recent Labs  04/30/13 2005  NA 148*  K 3.7  CL 110  CO2 29  GLUCOSE 124*  BUN 27*  CREATININE 1.29  CALCIUM 9.3   LFT  Recent Labs  04/30/13 2005  PROT 7.0  ALBUMIN 2.4*  AST 33  ALT 104*  ALKPHOS 54  BILITOT 0.6   PT/INR  Recent Labs  04/30/13 2005 05/01/13 0507  LABPROT 47.7* 57.3*  INR 5.49* 6.98*   Hepatitis Panel No results found for this basename: HEPBSAG, HCVAB, HEPAIGM, HEPBIGM,  in the last 72 hours    Studies/Results: Dg Chest 2 View  04/30/2013   CLINICAL DATA:  Pain and fever  EXAM: CHEST  2 VIEW  COMPARISON:  None.  FINDINGS: There is extensive pneumoperitoneum. There is no edema or consolidation. Heart is upper normal in size with normal pulmonary vascularity. No adenopathy. No bone lesions.  IMPRESSION: Pneumoperitoneum.  No edema or consolidation.  CriticalValue/emergent results were called by telephone at the time of interpretation on 04/30/2013 at 8:18 PMto the emergency department physician covering [facility], who verbally acknowledged these results.   Electronically Signed   By: Bretta Bang   On: 04/30/2013 20:20   Dg Abd 1 View  04/30/2013   CLINICAL DATA:  Check PEG tube placement  EXAM: ABDOMEN - 1 VIEW  COMPARISON:  None.  FINDINGS: Contrast administered via left-sided feeding tube opacifies nondilated small bowel. There are some other high-density areas in the abdomen which are  likely within bowel - present in all 4 quadrants. Pneumoperitoneum better seen previously. Nonobstructive bowel gas pattern. No abnormal intra-abdominal mass effect. Clear lung bases. Focally advanced degenerative disc changes at L4-5.  IMPRESSION: 1. Jejunostomy tube injection opacifies small bowel. Small collections of contrast elsewhere in the abdomen are favored intraluminal, which could be confirmed with CT if there is ongoing suspicion for tube leakage.  2.  Pneumoperitoneum.   Electronically Signed   By: Tiburcio Pea   On: 04/30/2013 23:15   Ct Abdomen Pelvis W Contrast  05/01/2013   *RADIOLOGY REPORT*  Clinical Data: GI bleeding, fever, blood and air  CT ABDOMEN AND PELVIS WITH CONTRAST  Technique:  Multidetector CT imaging of the abdomen and pelvis was performed following the standard protocol during bolus administration of intravenous contrast.  Contrast: OMNIPAQUE IOHEXOL 300 MG/ML  SOLN  Comparison: CT abdomen pelvis with and without contrast 11/03/2011 and MR abdomen 06/30/2003.  Findings: Lung bases:  There is mild bibasilar atelectasis. Calcified granuloma in the left lower lobe.  Negative for pleural effusion.  Negative for pneumothorax at the lung bases.  There is a moderate amount of pneumoperitoneum, predominately seen in the nondependent portion of the abdomen, with multiple tiny locules scattered throughout the upper abdomen bilaterally.  A pull-through type feeding tube traverses the left anterior abdominal wall and is positioned within a jejunal loop in the left upper quadrant.  Oral contrast was administered via the feeding tube, and by the time the exam was performed, the contrast has progressed throughout the small bowel loops distal to the feeding tube, and into the colon, to the level of the mid sigmoid colon. No free intraperitoneal spill of oral contrast is seen.  The stomach is decompressed.  The duodenum is unremarkable.  No free air is seen posterior to the stomach, or  adjacent to the duodenum.  2.3 x 2.7 cm left adrenal gland nodule is stable.  This is previously been evaluated with MRI in 2004, and shown to be most consistent with a benign adenoma.  The patient has a markedly enlarged prostate gland (5.8 centers AP diameter by 6.4 cm transverse  diameter by 6.1 cm craniocaudal span. The patient's urinary bladder is distended, and there is mild bilateral hydroureteronephrosis.  These findings raise the possibility of possible bladder outlet obstruction related to the prostamegaly. There is a single tiny locule of gas in the nondependent portion of the urinary bladder.  Urinary bladder wall is normal in thickness.  There is a soft tissue nodule in the lower anterior abdomen, directly anterior to the anterior wall of the distended urinary bladder there is not deftly present on prior CT. This measures 1.5 x1.8 x 0.7 cm.  There is suggestion of mild urothelial enhancement of both ureters. Suggest correlation with urinalysis to exclude urinary tract infection.  Bilateral low density renal lesions appear without significant change in most consistent with benign cysts.  The liver, gallbladder, spleen, right adrenal gland, and pancreas are within normal limits.  Sigmoid colon diverticulosis is noted, without acute inflammatory change. There are a few locules of gas just inferior to the cecum, and there is some fluid in this region of the right lower quadrant. There is also a small amount of ascites in the dependent portion of the pelvis.  The appendix is normal.  Abdominal aorta normal in caliber.  Negative for lymphadenopathy.  No acute bony abnormalities identified.  No suspicious osseous lesion.  IMPRESSION:  1. Moderate volume of pneumoperitoneum.  No free intraperitoneal spill of contrast or focal gastrointestinal tract wall thickening is identified. A definite etiology for the free intraperitoneal air is not identified.  A pull-through type feeding tube is present within a loop of  jejunum in the left upper quadrant.  Consider correlation with operative/procedure note.  This volume of pneumoperitoneum would not be expected  2 weeks after feeding tube placement (reportedly the procedure was performed approximately 2 weeks ago). 2. Marked prostamegaly.  Cannot exclude prostate carcinoma. Suggest correlation with PSA values.  The urinary bladder is markedly distended and there is mild bilateral hydroureteronephrosis. Findings raise the possibility of bladder outlet obstruction secondary to prostamegaly. 3.  Single locule of gas in the urinary bladder.  Question if there has been recent Foley catheter placement or other instrumentation to explain this. 4.  Mild urothelial enhancement of the ureters.  Cannot exclude urinary tract infection.  Suggest correlation with urinalysis. 5.  1.5 cm soft tissue nodule in the lower anterior abdomen, directly anterior to the urinary bladder.  This appears new/increased compared to prior studies.  A reactive lymph node or metastatic peritoneal implant cannot be excluded if the patient has an underlying malignancy. 6.  Small volume of pelvic ascites. 7.  Stable left adrenal gland adenoma.   Original Report Authenticated By: Britta Mccreedy, M.D.    IMPRESSION:  #16  57 year old African American male status post recent CVA with resultant expressive aphasia, neurogenic dysphagia and right hemiplegia. He is status post PEG versus PEJ tube placement at [facility] approximately 2 weeks ago and presents to the emergency room here with fever and an episode of rectal bleeding in the setting of chronic anticoagulation. Initial workup reveals a pneumoperitoneum with multiple tiny locules throughout the abdomen, there is suggestion of possible process at the cecum with tiny locules of air adjacent to the cecum and some fluid in that area. Certainly patient may have developed a pneumoperitoneum post tube placement which was not picked up as he is asymptomatic and  continues to be asymptomatic from a pain standpoint. There is no evidence of persistent leakage/perforation. #2 rectal bleeding in setting of supratherapeutic INR- Unclear whether this is  related to the same process causing the pneumoperitoneum or a separate issue. It was reported that he had dark red blood previously stool is now dark and heme positive. #3 super therapeutic INR #4 UTI #5 BPH  PLAN: #1 surgery is following and agree with their management of the pneumoperitoneum with IV antibiotics, n.p.o. , no surgery at this time. #2 we'll discuss resuming tube feedings since no evidence of persistent leakage #3 IV PPI #4 serial hemoglobins and transfuse as indicated, patient is receiving FFP this morning to correct the INR and INR will need to be close to normal prior to considering any endoscopic intervention. #5 once INR is normalized-we'll decide regarding need for possible EGD #6 [place]  [facility] is covering for Dr. Kandice Moos who will assume his GI care in a.m.   Amy Esterwood  05/01/2013, 10:08 AM     ________________________________________________________________________  [facility] MD note:  I personally examined the patient, reviewed the data and agree with the assessment and plan described above.  G tube check today in radiology.     Rob Bunting, MD [facility] Pager [phone]

## 2013-05-01 NOTE — ED Notes (Signed)
PA at bedside.

## 2013-05-01 NOTE — Progress Notes (Signed)
Stein Windhorst [id number] June 20, 1956  CARE TEAM:  PCP: No primary provider on file.  Outpatient Care Team: Patient has no care team.  Inpatient Treatment Team: Treatment Team: Attending Provider: Eddie North, MD; Consulting Physician: Bishop Limbo, MD; Registered Nurse: Dwyane Luo, RN; Rounding Team: Merlyn Albert, MD; Technician: Lynwood Dawley, NT; Registered Nurse: Silvestre Gunner, RN; Consulting Physician: Charna Elizabeth, MD; Registered Nurse: Elaina Pattee, RN   Subjective:  Pt denies pain No n/v  Objective:  Vital signs:  Filed Vitals:   05/01/13 0805 05/01/13 0850 05/01/13 0920 05/01/13 1020  BP: 128/69 127/70 127/73 130/72  Pulse: 96 95 92 90  Temp: 99.8 F (37.7 C) 99.7 F (37.6 C) 99.9 F (37.7 C) 97.5 F (36.4 C)  TempSrc: Axillary Oral Oral Oral  Resp: 20 20 18 18   Height:      Weight:      SpO2: 96% 95% 96% 95%       Intake/Output   Yesterday:  09/20 0701 - 09/21 0700 In: 90 [I.V.:77.5; Blood:12.5] Out: 1150 [Urine:1150] This shift:  Total I/O In: 365 [Blood:365] Out: -   Bowel function:  Flatus: y  BM: n  Drain: n/a  Physical Exam:  General: Pt awake/alert/oriented x4 in no acute distress Eyes: PERRL, normal EOM.  Sclera clear.  No icterus.  Mild r eyelid swelling/ecchymosis Neuro: CN II-XII intact w/o dense right hemiparesis. Lymph: No head/neck/groin lymphadenopathy Psych:  No delerium/psychosis/paranoia HENT: Normocephalic, Mucus membranes moist.  No thrush.  Mild dysarthria Neck: Supple, No tracheal deviation Chest: No chest wall pain w good excursion CV:  Pulses intact.  Regular rhythm MS: Normal AROM mjr joints.  No obvious deformity Abdomen: Soft.  Nondistended.  Nontender.  No guarding.  No evidence of peritonitis.  No incarcerated hernias. Ext:  SCDs BLE.  No mjr edema.  No cyanosis Skin: No petechiae / purpura   Problem List:   Principal Problem:   Pneumoperitoneum Active Problems:   Lower GI bleed    UTI (urinary tract infection)   Supratherapeutic INR   Chronic indwelling Foley catheter   Jejunostomy tube present (?originally G tube?)   Laceration of eyebrow s/p repair 04/26/2013   Hemiparesis affecting right side as late effect of stroke   Dysarthria as late effect of stroke   DVT of right axillary vein, acute   Assessment  Chanel Mckesson  57 y.o. male       Free air w Jtube despite PEG placed 04/12/2013 = POD# 19.  No evid of shock/peritonitis  Plan:  -UGI via NGT to r/o gastric leak.  If + for leak, go to OR once coagulaopathy corrected.  Hopefully stomach healed w/o leak.    -IV ABx  -correct coagulopathy, getting FFP  -Tx UTI  -VTE prophylaxis- SCDs, etc  -mobilize as tolerated to help recovery  Ardeth Sportsman, M.D., F.A.C.S. Gastrointestinal and [facility]imally Invasive [facility] [address] [place], [place] [address] [phone] Main / Paging   05/01/2013   Results:   Labs: Results for orders placed during the hospital encounter of 04/30/13 (from the past 48 hour(s))  URINALYSIS, ROUTINE W REFLEX MICROSCOPIC     Status: Abnormal   Collection Time    04/30/13  7:06 PM      Result Value Range   Color, Urine YELLOW  YELLOW   APPearance CLOUDY (*) CLEAR   Specific Gravity, Urine 1.017  1.005 - 1.030   pH 6.5  5.0 - 8.0  Glucose, UA NEGATIVE  NEGATIVE mg/dL   Hgb urine dipstick LARGE (*) NEGATIVE   Bilirubin Urine NEGATIVE  NEGATIVE   Ketones, ur NEGATIVE  NEGATIVE mg/dL   Protein, ur NEGATIVE  NEGATIVE mg/dL   Urobilinogen, UA 1.0  0.0 - 1.0 mg/dL   Nitrite NEGATIVE  NEGATIVE   Leukocytes, UA LARGE (*) NEGATIVE  URINE MICROSCOPIC-ADD ON     Status: Abnormal   Collection Time    04/30/13  7:06 PM      Result Value Range   WBC, UA TOO NUMEROUS TO COUNT  <3 WBC/hpf   Comment: WITH CLUMPS   RBC / HPF 11-20  <3 RBC/hpf   Bacteria, UA MANY (*) RARE  CBC WITH DIFFERENTIAL     Status: Abnormal   Collection  Time    04/30/13  8:05 PM      Result Value Range   WBC 15.5 (*) 4.0 - 10.5 K/uL   RBC 3.22 (*) 4.22 - 5.81 MIL/uL   Hemoglobin 10.3 (*) 13.0 - 17.0 g/dL   HCT 16.1 (*) 09.6 - 04.5 %   MCV 99.4  78.0 - 100.0 fL   MCH 32.0  26.0 - 34.0 pg   MCHC 32.2  30.0 - 36.0 g/dL   RDW 40.9  81.1 - 91.4 %   Platelets 214  150 - 400 K/uL   Neutrophils Relative % 83 (*) 43 - 77 %   Lymphocytes Relative 10 (*) 12 - 46 %   Monocytes Relative 7  3 - 12 %   Eosinophils Relative 0  0 - 5 %   Basophils Relative 0  0 - 1 %   Neutro Abs 12.8 (*) 1.7 - 7.7 K/uL   Lymphs Abs 1.6  0.7 - 4.0 K/uL   Monocytes Absolute 1.1 (*) 0.1 - 1.0 K/uL   Eosinophils Absolute 0.0  0.0 - 0.7 K/uL   Basophils Absolute 0.0  0.0 - 0.1 K/uL   WBC Morphology ATYPICAL LYMPHOCYTES     Comment: VACUOLATED NEUTROPHILS     DOHLE BODIES  COMPREHENSIVE METABOLIC PANEL     Status: Abnormal   Collection Time    04/30/13  8:05 PM      Result Value Range   Sodium 148 (*) 135 - 145 mEq/L   Potassium 3.7  3.5 - 5.1 mEq/L   Chloride 110  96 - 112 mEq/L   CO2 29  19 - 32 mEq/L   Glucose, Bld 124 (*) 70 - 99 mg/dL   BUN 27 (*) 6 - 23 mg/dL   Creatinine, Ser 7.82  0.50 - 1.35 mg/dL   Calcium 9.3  8.4 - 95.6 mg/dL   Total Protein 7.0  6.0 - 8.3 g/dL   Albumin 2.4 (*) 3.5 - 5.2 g/dL   AST 33  0 - 37 U/L   ALT 104 (*) 0 - 53 U/L   Alkaline Phosphatase 54  39 - 117 U/L   Total Bilirubin 0.6  0.3 - 1.2 mg/dL   GFR calc non Af Amer 60 (*) >90 mL/min   GFR calc Af Amer 69 (*) >90 mL/min   Comment: (NOTE)     The eGFR has been calculated using the CKD EPI equation.     This calculation has not been validated in all clinical situations.     eGFR's persistently <90 mL/min signify possible Chronic Kidney     Disease.  TYPE AND SCREEN     Status: None   Collection Time    04/30/13  8:05 PM      Result Value Range   ABO/RH(D) A POS     Antibody Screen NEG     Sample Expiration 05/03/2013    PROTIME-INR     Status: Abnormal    Collection Time    04/30/13  8:05 PM      Result Value Range   Prothrombin Time 47.7 (*) 11.6 - 15.2 seconds   INR 5.49 (*) 0.00 - 1.49   Comment: REPEATED TO VERIFY     CRITICAL RESULT CALLED TO, READ BACK BY AND VERIFIED WITH:     A DENNIS AT 2133 ON 09.20.2014 BY NBROOKS  APTT     Status: Abnormal   Collection Time    04/30/13  8:05 PM      Result Value Range   aPTT 74 (*) 24 - 37 seconds   Comment:            IF BASELINE aPTT IS ELEVATED,     SUGGEST PATIENT RISK ASSESSMENT     BE USED TO DETERMINE APPROPRIATE     ANTICOAGULANT THERAPY.  ABO/RH     Status: None   Collection Time    04/30/13  8:05 PM      Result Value Range   ABO/RH(D) A POS    CG4 I-STAT (LACTIC ACID)     Status: None   Collection Time    04/30/13  8:23 PM      Result Value Range   Lactic Acid, Venous 1.48  0.5 - 2.2 mmol/L  PREPARE FRESH FROZEN PLASMA     Status: None   Collection Time    04/30/13  8:42 PM      Result Value Range   Unit Number [id number]     Blood Component Type THAWED PLASMA     Unit division 00     Status of Unit ISSUED     Transfusion Status OK TO TRANSFUSE     Unit Number [id number]     Blood Component Type THAWED PLASMA     Unit division 00     Status of Unit ISSUED     Transfusion Status OK TO TRANSFUSE    CBC     Status: Abnormal   Collection Time    05/01/13  5:07 AM      Result Value Range   WBC 15.0 (*) 4.0 - 10.5 K/uL   RBC 2.89 (*) 4.22 - 5.81 MIL/uL   Hemoglobin 9.3 (*) 13.0 - 17.0 g/dL   HCT 32.9 (*) 51.8 - 84.1 %   MCV 99.7  78.0 - 100.0 fL   MCH 32.2  26.0 - 34.0 pg   MCHC 32.3  30.0 - 36.0 g/dL   RDW 66.0  63.0 - 16.0 %   Platelets 204  150 - 400 K/uL  PROTIME-INR     Status: Abnormal   Collection Time    05/01/13  5:07 AM      Result Value Range   Prothrombin Time 57.3 (*) 11.6 - 15.2 seconds   INR 6.98 (*) 0.00 - 1.49   Comment: REPEATED TO VERIFY     CRITICAL RESULT CALLED TO, READ BACK BY AND VERIFIED WITH:     P HAMILTON AT 0642 ON  09.21.2014 BY NBROOKS  MRSA PCR SCREENING     Status: None   Collection Time    05/01/13  6:39 AM      Result Value Range   MRSA by PCR NEGATIVE  NEGATIVE   Comment:  The GeneXpert MRSA Assay (FDA     approved for NASAL specimens     only), is one component of a     comprehensive MRSA colonization     surveillance program. It is not     intended to diagnose MRSA     infection nor to guide or     monitor treatment for     MRSA infections.    Imaging / Studies: Dg Chest 2 View  04/30/2013   CLINICAL DATA:  Pain and fever  EXAM: CHEST  2 VIEW  COMPARISON:  None.  FINDINGS: There is extensive pneumoperitoneum. There is no edema or consolidation. Heart is upper normal in size with normal pulmonary vascularity. No adenopathy. No bone lesions.  IMPRESSION: Pneumoperitoneum.  No edema or consolidation.  CriticalValue/emergent results were called by telephone at the time of interpretation on 04/30/2013 at 8:18 PMto the emergency department physician covering [facility], who verbally acknowledged these results.   Electronically Signed   By: Bretta Bang   On: 04/30/2013 20:20   Dg Abd 1 View  04/30/2013   CLINICAL DATA:  Check PEG tube placement  EXAM: ABDOMEN - 1 VIEW  COMPARISON:  None.  FINDINGS: Contrast administered via left-sided feeding tube opacifies nondilated small bowel. There are some other high-density areas in the abdomen which are likely within bowel - present in all 4 quadrants. Pneumoperitoneum better seen previously. Nonobstructive bowel gas pattern. No abnormal intra-abdominal mass effect. Clear lung bases. Focally advanced degenerative disc changes at L4-5.  IMPRESSION: 1. Jejunostomy tube injection opacifies small bowel. Small collections of contrast elsewhere in the abdomen are favored intraluminal, which could be confirmed with CT if there is ongoing suspicion for tube leakage.  2.  Pneumoperitoneum.   Electronically Signed   By: Tiburcio Pea   On:  04/30/2013 23:15   Ct Abdomen Pelvis W Contrast  05/01/2013   *RADIOLOGY REPORT*  Clinical Data: GI bleeding, fever, blood and air  CT ABDOMEN AND PELVIS WITH CONTRAST  Technique:  Multidetector CT imaging of the abdomen and pelvis was performed following the standard protocol during bolus administration of intravenous contrast.  Contrast: OMNIPAQUE IOHEXOL 300 MG/ML  SOLN  Comparison: CT abdomen pelvis with and without contrast 11/03/2011 and MR abdomen 06/30/2003.  Findings: Lung bases:  There is mild bibasilar atelectasis. Calcified granuloma in the left lower lobe.  Negative for pleural effusion.  Negative for pneumothorax at the lung bases.  There is a moderate amount of pneumoperitoneum, predominately seen in the nondependent portion of the abdomen, with multiple tiny locules scattered throughout the upper abdomen bilaterally.  A pull-through type feeding tube traverses the left anterior abdominal wall and is positioned within a jejunal loop in the left upper quadrant.  Oral contrast was administered via the feeding tube, and by the time the exam was performed, the contrast has progressed throughout the small bowel loops distal to the feeding tube, and into the colon, to the level of the mid sigmoid colon. No free intraperitoneal spill of oral contrast is seen.  The stomach is decompressed.  The duodenum is unremarkable.  No free air is seen posterior to the stomach, or adjacent to the duodenum.  2.3 x 2.7 cm left adrenal gland nodule is stable.  This is previously been evaluated with MRI in 2004, and shown to be most consistent with a benign adenoma.  The patient has a markedly enlarged prostate gland (5.8 centers AP diameter by 6.4 cm transverse diameter by 6.1 cm  craniocaudal span. The patient's urinary bladder is distended, and there is mild bilateral hydroureteronephrosis.  These findings raise the possibility of possible bladder outlet obstruction related to the prostamegaly. There is a single  tiny locule of gas in the nondependent portion of the urinary bladder.  Urinary bladder wall is normal in thickness.  There is a soft tissue nodule in the lower anterior abdomen, directly anterior to the anterior wall of the distended urinary bladder there is not deftly present on prior CT. This measures 1.5 x1.8 x 0.7 cm.  There is suggestion of mild urothelial enhancement of both ureters. Suggest correlation with urinalysis to exclude urinary tract infection.  Bilateral low density renal lesions appear without significant change in most consistent with benign cysts.  The liver, gallbladder, spleen, right adrenal gland, and pancreas are within normal limits.  Sigmoid colon diverticulosis is noted, without acute inflammatory change. There are a few locules of gas just inferior to the cecum, and there is some fluid in this region of the right lower quadrant. There is also a small amount of ascites in the dependent portion of the pelvis.  The appendix is normal.  Abdominal aorta normal in caliber.  Negative for lymphadenopathy.  No acute bony abnormalities identified.  No suspicious osseous lesion.  IMPRESSION:  1. Moderate volume of pneumoperitoneum.  No free intraperitoneal spill of contrast or focal gastrointestinal tract wall thickening is identified. A definite etiology for the free intraperitoneal air is not identified.  A pull-through type feeding tube is present within a loop of jejunum in the left upper quadrant.  Consider correlation with operative/procedure note.  This volume of pneumoperitoneum would not be expected  2 weeks after feeding tube placement (reportedly the procedure was performed approximately 2 weeks ago). 2. Marked prostamegaly.  Cannot exclude prostate carcinoma. Suggest correlation with PSA values.  The urinary bladder is markedly distended and there is mild bilateral hydroureteronephrosis. Findings raise the possibility of bladder outlet obstruction secondary to prostamegaly. 3.  Single  locule of gas in the urinary bladder.  Question if there has been recent Foley catheter placement or other instrumentation to explain this. 4.  Mild urothelial enhancement of the ureters.  Cannot exclude urinary tract infection.  Suggest correlation with urinalysis. 5.  1.5 cm soft tissue nodule in the lower anterior abdomen, directly anterior to the urinary bladder.  This appears new/increased compared to prior studies.  A reactive lymph node or metastatic peritoneal implant cannot be excluded if the patient has an underlying malignancy. 6.  Small volume of pelvic ascites. 7.  Stable left adrenal gland adenoma.   Original Report Authenticated By: Britta Mccreedy, M.D.    Medications / Allergies: per chart  Antibiotics: Anti-infectives   Start     Dose/Rate Route Frequency Ordered Stop   05/01/13 2200  cefTRIAXone (ROCEPHIN) 1 g in dextrose 5 % 50 mL IVPB     1 g 100 mL/hr over 30 Minutes Intravenous Every 24 hours 05/01/13 0440     04/30/13 2015  cefTRIAXone (ROCEPHIN) 1 g in dextrose 5 % 50 mL IVPB     1 g 100 mL/hr over 30 Minutes Intravenous  Once 04/30/13 2001 04/30/13 2204

## 2013-05-01 NOTE — Progress Notes (Signed)
CRITICAL VALUE ALERT  Critical value received: INR 6.98  Date of notification: 05-01-2013  Time of notification: 0643  Critical value read back- yes Nurse who received alert: Samara Deist MD notified (1st page): [email or web address]  Time of first page: 0645  MD notified (2nd page):  Time of second page:  Responding MD:  Time MD responded: Last edited by Dwyane Luo, RN on 05/01/13 at [phone]

## 2013-05-02 DIAGNOSIS — D649 Anemia, unspecified: Secondary | ICD-10-CM

## 2013-05-02 DIAGNOSIS — R791 Abnormal coagulation profile: Secondary | ICD-10-CM

## 2013-05-02 LAB — BASIC METABOLIC PANEL
BUN: 15 mg/dL (ref 6–23)
BUN: 13 mg/dL (ref 6–23)
CO2: 29 mEq/L (ref 19–32)
Calcium: 9 mg/dL (ref 8.4–10.5)
Chloride: 110 mEq/L (ref 96–112)
Creatinine, Ser: 0.81 mg/dL (ref 0.50–1.35)
Creatinine, Ser: 0.82 mg/dL (ref 0.50–1.35)
GFR calc Af Amer: >90 mL/min (ref >90)
GFR calc Af Amer: >90 mL/min (ref >90)
GFR calc non Af Amer: >90 mL/min (ref >90)
Glucose, Bld: 131 mg/dL — ABNORMAL HIGH (ref 70–99)
Potassium: 2.9 mEq/L — ABNORMAL LOW (ref 3.5–5.1)
Potassium: 3.5 mEq/L (ref 3.5–5.1)

## 2013-05-02 LAB — CBC
HCT: 24.2 % — ABNORMAL LOW (ref 39.0–52.0)
HCT: 25.7 % — ABNORMAL LOW (ref 39.0–52.0)
Hemoglobin: 7.8 g/dL — ABNORMAL LOW (ref 13.0–17.0)
Hemoglobin: 7.9 g/dL — ABNORMAL LOW (ref 13.0–17.0)
MCH: 31.7 pg (ref 26.0–34.0)
MCH: 31.7 pg (ref 26.0–34.0)
MCHC: 32.2 g/dL (ref 30.0–36.0)
MCHC: 32.3 g/dL (ref 30.0–36.0)
MCV: 98.4 fL (ref 78.0–100.0)
MCV: 98 fL (ref 78.0–100.0)
MCV: 97.7 fL (ref 78.0–100.0)
Platelets: 215 10*3/uL (ref 150–400)
Platelets: 226 10*3/uL (ref 150–400)
RBC: 2.49 MIL/uL — ABNORMAL LOW (ref 4.22–5.81)
RDW: 13.3 % (ref 11.5–15.5)
RDW: 13.2 % (ref 11.5–15.5)
RDW: 13.1 % (ref 11.5–15.5)

## 2013-05-02 LAB — PREPARE FRESH FROZEN PLASMA: Unit division: 0

## 2013-05-02 LAB — PROTIME-INR: INR: 3.91 — ABNORMAL HIGH (ref 0.00–1.49)

## 2013-05-02 LAB — OCCULT BLOOD X 1 CARD TO LAB, STOOL: Fecal Occult Bld: NEGATIVE

## 2013-05-02 LAB — MAGNESIUM: Magnesium: 2.3 mg/dL (ref 1.5–2.5)

## 2013-05-02 MED ORDER — POTASSIUM CHLORIDE 10 MEQ/100ML IV SOLN
10.0000 meq | INTRAVENOUS | Status: AC
  Administered 2013-05-02 (×4): 10 meq via INTRAVENOUS
  Filled 2013-05-02 (×4): qty 100

## 2013-05-02 MED ORDER — POTASSIUM CHLORIDE 2 MEQ/ML IV SOLN
INTRAVENOUS | Status: DC
  Administered 2013-05-02 – 2013-05-04 (×3): via INTRAVENOUS
  Filled 2013-05-02 (×7): qty 1000

## 2013-05-02 NOTE — Progress Notes (Signed)
Unassigned patient Subjective: Patient seems to be stable since discharge. As per the patient's nurse, he has not any bleeding; some loose stools earlier today. Has been passing a lot of flatus. Denies having any abdominal pain.    Objective: Vital signs in last 24 hours: Temp:  [98.4 F (36.9 C)-99.9 F (37.7 C)] 99.9 F (37.7 C) (09/22 1340) Pulse Rate:  [48-83] 63 (09/22 1340) Resp:  [20] 20 (09/22 1340) BP: (115-158)/(64-75) 115/64 mmHg (09/22 1340) SpO2:  [97 %-98 %] 97 % (09/22 1340) Last BM Date: 05/02/13  Intake/Output from previous day: 09/21 0701 - 09/22 0700 In: 4064.2 [P.O.:60; I.V.:2133.3; Blood:720.8; IV Piggyback:150] Out: 540 [Urine:540] Intake/Output this shift:   General appearance: cooperative, appears older than stated age, no distress has expressive aphasia Resp: clear to auscultation bilaterally Cardio: regular rate and rhythm, S1, S2 normal, no murmur, click, rub or gallop GI: soft, non-tender; bowel sounds normal; no masses,  no organomegaly, PEG present Extremities: extremities normal, atraumatic, no cyanosis or edema; right sided paresis noted  Lab Results:  Recent Labs  05/02/13 0440 05/02/13 1035 05/02/13 1711  WBC 8.6 7.5 7.7  HGB 7.8* 7.9* 8.3*  HCT 24.2* 24.4* 25.7*  PLT 215 226 253   BMET  Recent Labs  05/01/13 1641 05/02/13 0440 05/02/13 1708  NA 151* 145 141  K 3.4* 2.9* 3.5  CL 114* 110 106  CO2 29 29 27   GLUCOSE 101* 131* 116*  BUN 16 15 13   CREATININE 0.82 0.81 0.82  CALCIUM 9.6 9.2 9.0   LFT  Recent Labs  04/30/13 2005  PROT 7.0  ALBUMIN 2.4*  AST 33  ALT 104*  ALKPHOS 54  BILITOT 0.6   PT/INR  Recent Labs  05/01/13 1641 05/02/13 0440  LABPROT 33.5* 36.8*  INR 3.46* 3.91*   HStudies/Results: Dg Abd 1 View  05/01/2013   CLINICAL DATA:  Evaluate for gastric leak  EXAM: ABDOMEN - 1 VIEW  COMPARISON:  CT abdomen pelvis dated 05/01/2013  FINDINGS: Contrast was administered via the malpositioned gastrostomy  tube (currently located within the jejunum).  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 A corresponding abnormality is present on CT (series 7/images 10-14).  No free extraluminal contrast is noted.  IMPRESSION: Malpositioned gastrostomy tube within the jejunum.  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  No free extraluminal contrast is noted.   Electronically Signed   By: Charline Bills M.D.   On: 05/01/2013 13:37   Dg Abd 1 View  04/30/2013   CLINICAL DATA:  Check PEG tube placement  EXAM: ABDOMEN - 1 VIEW  COMPARISON:  None.  FINDINGS: Contrast administered via left-sided feeding tube opacifies nondilated small bowel. There are some other high-density areas in the abdomen which are likely within bowel - present in all 4 quadrants. Pneumoperitoneum better seen previously. Nonobstructive bowel gas pattern. No abnormal intra-abdominal mass effect. Clear lung bases. Focally advanced degenerative disc changes at L4-5.  IMPRESSION: 1. Jejunostomy tube injection opacifies small bowel. Small collections of contrast elsewhere in the abdomen are favored intraluminal, which could be confirmed with CT if there is ongoing suspicion for tube leakage.  2.  Pneumoperitoneum.   Electronically Signed   By: Tiburcio Pea   On: 04/30/2013 23:15   Ct Abdomen Pelvis W Contrast  05/01/2013   *RADIOLOGY REPORT*  Clinical Data: GI bleeding, fever, blood and air  CT ABDOMEN AND PELVIS WITH CONTRAST  Technique:  Multidetector CT imaging  of the abdomen and pelvis was performed following the standard protocol during bolus administration of intravenous contrast.  Contrast: OMNIPAQUE IOHEXOL 300 MG/ML  SOLN  Comparison: CT abdomen pelvis with and without contrast 11/03/2011 and MR abdomen 06/30/2003.  Findings: Lung bases:  There is mild bibasilar atelectasis. Calcified granuloma in the left lower lobe.  Negative for pleural  effusion.  Negative for pneumothorax at the lung bases.  There is a moderate amount of pneumoperitoneum, predominately seen in the nondependent portion of the abdomen, with multiple tiny locules scattered throughout the upper abdomen bilaterally.  A pull-through type feeding tube traverses the left anterior abdominal wall and is positioned within a jejunal loop in the left upper quadrant.  Oral contrast was administered via the feeding tube, and by the time the exam was performed, the contrast has progressed throughout the small bowel loops distal to the feeding tube, and into the colon, to the level of the mid sigmoid colon. No free intraperitoneal spill of oral contrast is seen.  The stomach is decompressed.  The duodenum is unremarkable.  No free air is seen posterior to the stomach, or adjacent to the duodenum.  2.3 x 2.7 cm left adrenal gland nodule is stable.  This is previously been evaluated with MRI in 2004, and shown to be most consistent with a benign adenoma.  The patient has a markedly enlarged prostate gland (5.8 centers AP diameter by 6.4 cm transverse diameter by 6.1 cm craniocaudal span. The patient's urinary bladder is distended, and there is mild bilateral hydroureteronephrosis.  These findings raise the possibility of possible bladder outlet obstruction related to the prostamegaly. There is a single tiny locule of gas in the nondependent portion of the urinary bladder.  Urinary bladder wall is normal in thickness.  There is a soft tissue nodule in the lower anterior abdomen, directly anterior to the anterior wall of the distended urinary bladder there is not deftly present on prior CT. This measures 1.5 x1.8 x 0.7 cm.  There is suggestion of mild urothelial enhancement of both ureters. Suggest correlation with urinalysis to exclude urinary tract infection.  Bilateral low density renal lesions appear without significant change in most consistent with benign cysts.  The liver, gallbladder, spleen,  right adrenal gland, and pancreas are within normal limits.  Sigmoid colon diverticulosis is noted, without acute inflammatory change. There are a few locules of gas just inferior to the cecum, and there is some fluid in this region of the right lower quadrant. There is also a small amount of ascites in the dependent portion of the pelvis.  The appendix is normal.  Abdominal aorta normal in caliber.  Negative for lymphadenopathy.  No acute bony abnormalities identified.  No suspicious osseous lesion.  IMPRESSION:  1. Moderate volume of pneumoperitoneum.  No free intraperitoneal spill of contrast or focal gastrointestinal tract wall thickening is identified. A definite etiology for the free intraperitoneal air is not identified.  A pull-through type feeding tube is present within a loop of jejunum in the left upper quadrant.  Consider correlation with operative/procedure note.  This volume of pneumoperitoneum would not be expected  2 weeks after feeding tube placement (reportedly the procedure was performed approximately 2 weeks ago). 2. Marked prostamegaly.  Cannot exclude prostate carcinoma. Suggest correlation with PSA values.  The urinary bladder is markedly distended and there is mild bilateral hydroureteronephrosis. Findings raise the possibility of bladder outlet obstruction secondary to prostamegaly. 3.  Single locule of gas in the urinary bladder.  Question if there has been recent Foley catheter placement or other instrumentation to explain this. 4.  Mild urothelial enhancement of the ureters.  Cannot exclude urinary tract infection.  Suggest correlation with urinalysis. 5.  1.5 cm soft tissue nodule in the lower anterior abdomen, directly anterior to the urinary bladder.  This appears new/increased compared to prior studies.  A reactive lymph node or metastatic peritoneal implant cannot be excluded if the patient has an underlying malignancy. 6.  Small volume of pelvic ascites. 7.  Stable left adrenal  gland adenoma.   Original Report Authenticated By: Britta Mccreedy, M.D.   Dg Kayleen Memos W/water Sol Cm  05/01/2013   CLINICAL DATA:  Evaluate for gastric leak  EXAM: ESOPHAGUS/BARIUM SWALLOW/TABLET STUDY  TECHNIQUE: Water-soluble contrast was injected into the existing NG tube and imaging was obtained.  COMPARISON:  None.  FLUOROSCOPY TIME:  1 min and 1 2nd.  FINDINGS: Contrast fills the stomach. There is no extravasation of contrast into the peritoneal space. Contrast does fill a fistula to the adjacent jejunum.  IMPRESSION: There is no evidence of intraperitoneal leak of contrast from the stomach. 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   Electronically Signed   By: Maryclare Bean M.D.   On: 05/01/2013 14:51    Medications: I have reviewed the patient's current medications.  Assessment/Plan: 1) Rectal bleeding with supra therapeutic INR/anemia: see,s stable currently. Agree with starting tube feeds via PEG. A pneumoperitoneum is the sequelae of a PEG placement. This is not an unexpected finding. I do not feel an EGD is needed at this time unless there is evidence of ongoing bleeding. He is receiving FFP. Will discuss with THP in AM. 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 3) DVT-right axillary vein. Coumadin is on hold.    LOS: 2 days   Isabell Bonafede 05/02/2013, 9:18 PM

## 2013-05-02 NOTE — Evaluation (Signed)
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 He had a PEG tube placed in the hospital due to dysphagia. Patient sent from there as he had a fever with one episode of bright red blood per rectum on the day of admission. History primarily provided by brother at bedside and from ED notes.. Mother also reports the patient was complaining of some abdominal pain for the last 1 week. Patient was noted to have a fever of 103F in the nursing home and with this into the ED.    Clinical Impression   Pt presents to OT with dependence with all ADL activity. Pts R UE flaccid. No active movement noted.  Pt will benefit from OT for positioning or RUE to protect joints, as well as to increase I with ADL activity using hemi techniques    OT Assessment  Patient needs continued OT Services    Follow Up Recommendations  SNF             Frequency  Min 2X/week    Precautions / Restrictions Precautions Precaution Comments: right hemiplegia       ADL  Grooming: Performed;Moderate assistance Where Assessed - Grooming: Supine, head of bed up Upper Body Bathing: +2 Total assistance Lower Body Bathing: +2 Total assistance Where Assessed - Lower Body Bathing: Supine, head of bed up Upper Body Dressing: +2 Total assistance Where Assessed - Upper Body Dressing: Supine, head of bed up Lower Body Dressing: +2 Total assistance Where Assessed - Lower Body Dressing: Supine, head of bed up Transfers/Ambulation Related to ADLs: Pt very hard to understand.  Pt  wanted a cold drink. Nurse said it was ok to give him a swab.  Pt stated " this is nothing "    OT Diagnosis: Generalized weakness;Hemiplegia dominant side  OT Problem List: Decreased strength;Decreased range of motion;Decreased activity tolerance;Impaired balance (sitting and/or standing);Impaired UE functional use OT Treatment Interventions: Self-care/ADL training;Therapeutic exercise;Patient/family education   OT Goals(Current goals can be found in the care plan section) Acute Rehab OT Goals Patient Stated Goal: to get stronger ADL Goals Pt Will Perform Grooming: with mod assist;sitting;bed level Additional ADL Goal #1: Pt will verbalize correct positioning of LUE to protect joints and prevent edema with S Additional ADL Goal #2: Pt will peform PROM with LUE assisting LUE to maintain joint mobility with S  Visit Information  Last OT Received On: 05/02/13 Assistance Needed: +2 Reason Eval/Treat Not Completed: Other (comment) (Noted pt from SNF returning to SNF. Defer OT eval to SNF) History of Present Illness: 57 year old male with history of hypertension, history of DVT on Coumadin who was admitted to [facility] recently with an acute stroke involving a large area on the left side with residual right hemiplegia and dysphasia was discharged to skilled nursing facility. He had a PEG tube placed in the hospital due to dysphagia. Patient sent from there as he had a fever with one episode of bright red blood per rectum on the day of admission. History primarily provided by brother at bedside and from ED notes.. Mother also reports the patient was complaining of some abdominal  pain for the last 1 week. Patient was noted to have a fever of 103F in the nursing home and with this into the ED.        Prior Functioning     Home Living Family/patient expects to be discharged to:: Skilled nursing facility Additional Comments: ?? pt states his brother is his caregiver  Prior Function Level  of Independence: Needs assistance Gait / Transfers Assistance Needed: pt reports he was doing some standing with therapy at SNF Communication Communication: No difficulties         Vision/Perception Vision - History Baseline Vision: Other (comment) (pt able to see clock and calendar, but did report dbl vision)   Cognition  Cognition Arousal/Alertness: Awake/alert Behavior During Therapy: WFL for tasks assessed/performed Overall Cognitive Status: Within Functional Limits for tasks assessed    Extremity/Trunk Assessment Upper Extremity Assessment Upper Extremity Assessment: RUE deficits/detail RUE Deficits / Details: PROM WFL, decreased tone throughout, pain free RUE:  (no active movement noted) RUE Coordination: decreased fine motor;decreased gross motor Lower Extremity Assessment Lower Extremity Assessment: RLE deficits/detail;LLE deficits/detail RLE Deficits / Details: mild tone right LE, PROM WFL, pain free LLE Deficits / Details: grossly 3 to 3+/5     Mobility Bed Mobility Bed Mobility: Right Sidelying to Sit;Rolling Right;Sitting - Scoot to Edge of Bed;Sit to Sidelying Right Rolling Right: 3: Mod assist;With rail Right Sidelying to Sit: 1: +2 Total assist Right Sidelying to Sit: Patient Percentage: 30% Supine to Sit: HOB elevated Sitting - Scoot to Edge of Bed: 1: +2 Total assist Sitting - Scoot to Edge of Bed: Patient Percentage: 40% Sit to Sidelying Right: 1: +2 Total assist Sit to Sidelying Right: Patient Percentage: 30% Details for Bed Mobility Assistance: cues for technique, +2 for lines, safety, trunk control and RUE/LE           End of Session OT - End of Session Activity Tolerance: Patient limited by fatigue Patient left: in bed;with nursing/sitter in room  GO     [name] [name], [name] [name] 05/02/2013, 12:41 PM

## 2013-05-02 NOTE — Progress Notes (Signed)
INITIAL NUTRITION ASSESSMENT  DOCUMENTATION CODES Per approved criteria  -Not Applicable   INTERVENTION: Once medically able to resume feedings, recommend initiation of Osmolite 1.5 at 30 ml/hr via enteral feeding tube, advance by 10 ml q 4 hours, to goal of 50 ml/hr. Add 30 ml Prostat daily via tube. This regimen will provide: 1900 kcal, 91 grams protein, 914 ml free water. If bolus regimen desired, resume 8 oz (1 can) Osmolite 1.5 five times daily. Add 30 ml Prostat via tube. This regimen will provide: 1875 kcal, 90 grams protein, 905 ml free water. Once IVF discontinued, recommend resuming free water flushes of 200 ml free water via PEG 5 times daily. This will provide an additional 1000 ml free water daily. RD to continue to follow nutrition care plan.  NUTRITION DIAGNOSIS: Inadequate oral intake related to inability to eat as evidenced by NPO status, need for PEG feedings for nutrition.   Goal: Resume enteral feedings per team. Intake to meet at least 90% of estimated needs.  Monitor:  weight trends, lab trends, I/O's, enteral nutrition resumption and tolerance  Reason for Assessment: Low Braden Score  57 y.o. male  Admitting Dx: Pneumoperitoneum  ASSESSMENT: PMHx significant for CVA, PEG placement x 2 weeks ago. Pt is from SNF. Admitted with bloody stools and fever. Work-up reveals pneumoperitoneum.  The feeding tube was evaluated on admission and was found to be located in a loop of jejunum without leakage of contrast. Surgery recommending resumption of PEG for feedings per 9/21 note.  Per chart review, PTA, pt was receiving 1 can of Osmolite 1.5, 5 times daily with 100 ml free water flushes before and after each feeding. This regimen provides 1775 kcal, 75 grams protein, 1905 ml free water daily.  Pt is at nutrition risk given recent CVA and possible prior weight loss. Unable to speak with patient presently.  Height: Ht Readings from Last 1 Encounters:  05/01/13 5\' 11"   (1.803 m)    Weight: Wt Readings from Last 1 Encounters:  05/01/13 184 lb 1.4 oz (83.5 kg)    Ideal Body Weight: 172 lb  % Ideal Body Weight: 107%  Wt Readings from Last 10 Encounters:  05/01/13 184 lb 1.4 oz (83.5 kg)  03/07/13 215 lb (97.523 kg)    Usual Body Weight: ?215 lb (pt is unable to state)  % Usual Body Weight: 86%  BMI:  Body mass index is 25.69 kg/(m^2). Overweight.  Estimated Nutritional Needs: Kcal: 1750 - 1950 Protein: 83 - 99 g  Fluid: 1.8 - 2 liters daily  Skin: MASD to buttocks  Diet Order: NPO  EDUCATION NEEDS: -No education needs identified at this time   Intake/Output Summary (Last 24 hours) at 05/02/13 0918 Last data filed at 05/02/13 0600  Gross per 24 hour  Intake 3699.16 ml  Output    540 ml  Net 3159.16 ml    Last BM: 9/21  Labs:   Recent Labs Lab 04/30/13 2005 05/01/13 1641 05/02/13 0440  NA 148* 151* 145  K 3.7 3.4* 2.9*  CL 110 114* 110  CO2 29 29 29   BUN 27* 16 15  CREATININE 1.29 0.82 0.81  CALCIUM 9.3 9.6 9.2  MG  --   --  2.3  GLUCOSE 124* 101* 131*    CBG (last 3)  No results found for this basename: GLUCAP,  in the last 72 hours  Scheduled Meds: . antiseptic oral rinse  15 mL Mouth Rinse q12n4p  . cefTRIAXone (ROCEPHIN)  IV  1  g Intravenous Q24H  . chlorhexidine  15 mL Mouth Rinse BID  . influenza vac split quadrivalent PF  0.5 mL Intramuscular Tomorrow-1000  . lip balm  1 application Topical BID  . pantoprazole (PROTONIX) IV  40 mg Intravenous Q12H  . pneumococcal 23 valent vaccine  0.5 mL Intramuscular Tomorrow-1000  . potassium chloride  10 mEq Intravenous Q1 Hr x 4  . potassium chloride  40 mEq Per Tube Once    Continuous Infusions: . dextrose 5 % with kcl      Past Medical History  Diagnosis Date  . Dysphagia   . DVT (deep venous thrombosis)   . Hypertension   . Depression   . 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 Dysarthria as late effect of stroke  05/01/2013  . 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 Esophagoscopy w/ percutaneous gastrostomy tube placement  04/12/2013    Dr Murrell Converse, [facility]    Jarold Motto MS, RD, LDN Pager: [phone] After-hours pager: [phone]

## 2013-05-02 NOTE — Progress Notes (Signed)
Subjective:  He wants the feeding tube out ,but he remembers trouble swallowing. No abdominal pain or complaints.  Objective: Vital signs in last 24 hours: Temp:  [97.8 F (36.6 C)-99.2 F (37.3 C)] 99.2 F (37.3 C) (09/22 0553) Pulse Rate:  [48-83] 83 (09/22 0553) Resp:  [18-20] 20 (09/22 0553) BP: (121-158)/(71-75) 158/73 mmHg (09/22 0553) SpO2:  [97 %-98 %] 98 % (09/22 0553) Last BM Date: 05/01/13 60 PO recorded, multiple stools (7) reported. Afebrile,VSS  H/H is down from yesterday 9.2 to 7.8 this AM. INR 3.1 down from 6.98 on admit. Intake/Output from previous day: 09/21 0701 - 09/22 0700 In: 4064.2 [P.O.:60; I.V.:2133.3; Blood:720.8; IV Piggyback:150] Out: 540 [Urine:540] Intake/Output this shift:    General appearance: alert, cooperative, no distress and speech slurred, but you can understand him pretty well. GI: soft, non-tender; bowel sounds normal; no masses,  no organomegaly and feeding tube is in place, it had some drainage around it.  Skin intact and no cellulitis.  Lab Results:   Recent Labs  05/02/13 0440 05/02/13 1035  WBC 8.6 7.5  HGB 7.8* 7.9*  HCT 24.2* 24.4*  PLT 215 226    BMET  Recent Labs  05/01/13 1641 05/02/13 0440  NA 151* 145  K 3.4* 2.9*  CL 114* 110  CO2 29 29  GLUCOSE 101* 131*  BUN 16 15  CREATININE 0.82 0.81  CALCIUM 9.6 9.2   PT/INR  Recent Labs  05/01/13 1641 05/02/13 0440  LABPROT 33.5* 36.8*  INR 3.46* 3.91*     Recent Labs Lab 04/30/13 2005  AST 33  ALT 104*  ALKPHOS 54  BILITOT 0.6  PROT 7.0  ALBUMIN 2.4*     Lipase  No results found for this basename: lipase     Studies/Results: Dg Chest 2 View  04/30/2013   CLINICAL DATA:  Pain and fever  EXAM: CHEST  2 VIEW  COMPARISON:  None.  FINDINGS: There is extensive pneumoperitoneum. There is no edema or consolidation. Heart is upper normal in size with normal pulmonary vascularity. No adenopathy. No bone lesions.  IMPRESSION: Pneumoperitoneum.   No edema or consolidation.  CriticalValue/emergent results were called by telephone at the time of interpretation on 04/30/2013 at 8:18 PMto the emergency department physician covering [facility], who verbally acknowledged these results.   Electronically Signed   By: Bretta Bang   On: 04/30/2013 20:20   Dg Abd 1 View  05/01/2013   CLINICAL DATA:  Evaluate for gastric leak  EXAM: ABDOMEN - 1 VIEW  COMPARISON:  CT abdomen pelvis dated 05/01/2013  FINDINGS: Contrast was administered via the malpositioned gastrostomy tube (currently located within the jejunum).  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 A corresponding abnormality is present on CT (series 7/images 10-14).  No free extraluminal contrast is noted.  IMPRESSION: Malpositioned gastrostomy tube within the jejunum.  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  No free extraluminal contrast is noted.   Electronically Signed   By: Charline Bills M.D.   On: 05/01/2013 13:37   Dg Abd 1 View  04/30/2013   CLINICAL DATA:  Check PEG tube placement  EXAM: ABDOMEN - 1 VIEW  COMPARISON:  None.  FINDINGS: Contrast administered via left-sided feeding tube opacifies nondilated small bowel. There are some other high-density areas in the abdomen which are likely within bowel - present in all 4 quadrants. Pneumoperitoneum better seen previously. Nonobstructive bowel gas pattern. No abnormal intra-abdominal  mass effect. Clear lung bases. Focally advanced degenerative disc changes at L4-5.  IMPRESSION: 1. Jejunostomy tube injection opacifies small bowel. Small collections of contrast elsewhere in the abdomen are favored intraluminal, which could be confirmed with CT if there is ongoing suspicion for tube leakage.  2.  Pneumoperitoneum.   Electronically Signed   By: Tiburcio Pea   On: 04/30/2013 23:15   Ct Abdomen Pelvis W Contrast  05/01/2013    *RADIOLOGY REPORT*  Clinical Data: GI bleeding, fever, blood and air  CT ABDOMEN AND PELVIS WITH CONTRAST  Technique:  Multidetector CT imaging of the abdomen and pelvis was performed following the standard protocol during bolus administration of intravenous contrast.  Contrast: OMNIPAQUE IOHEXOL 300 MG/ML  SOLN  Comparison: CT abdomen pelvis with and without contrast 11/03/2011 and MR abdomen 06/30/2003.  Findings: Lung bases:  There is mild bibasilar atelectasis. Calcified granuloma in the left lower lobe.  Negative for pleural effusion.  Negative for pneumothorax at the lung bases.  There is a moderate amount of pneumoperitoneum, predominately seen in the nondependent portion of the abdomen, with multiple tiny locules scattered throughout the upper abdomen bilaterally.  A pull-through type feeding tube traverses the left anterior abdominal wall and is positioned within a jejunal loop in the left upper quadrant.  Oral contrast was administered via the feeding tube, and by the time the exam was performed, the contrast has progressed throughout the small bowel loops distal to the feeding tube, and into the colon, to the level of the mid sigmoid colon. No free intraperitoneal spill of oral contrast is seen.  The stomach is decompressed.  The duodenum is unremarkable.  No free air is seen posterior to the stomach, or adjacent to the duodenum.  2.3 x 2.7 cm left adrenal gland nodule is stable.  This is previously been evaluated with MRI in 2004, and shown to be most consistent with a benign adenoma.  The patient has a markedly enlarged prostate gland (5.8 centers AP diameter by 6.4 cm transverse diameter by 6.1 cm craniocaudal span. The patient's urinary bladder is distended, and there is mild bilateral hydroureteronephrosis.  These findings raise the possibility of possible bladder outlet obstruction related to the prostamegaly. There is a single tiny locule of gas in the nondependent portion of the urinary  bladder.  Urinary bladder wall is normal in thickness.  There is a soft tissue nodule in the lower anterior abdomen, directly anterior to the anterior wall of the distended urinary bladder there is not deftly present on prior CT. This measures 1.5 x1.8 x 0.7 cm.  There is suggestion of mild urothelial enhancement of both ureters. Suggest correlation with urinalysis to exclude urinary tract infection.  Bilateral low density renal lesions appear without significant change in most consistent with benign cysts.  The liver, gallbladder, spleen, right adrenal gland, and pancreas are within normal limits.  Sigmoid colon diverticulosis is noted, without acute inflammatory change. There are a few locules of gas just inferior to the cecum, and there is some fluid in this region of the right lower quadrant. There is also a small amount of ascites in the dependent portion of the pelvis.  The appendix is normal.  Abdominal aorta normal in caliber.  Negative for lymphadenopathy.  No acute bony abnormalities identified.  No suspicious osseous lesion.  IMPRESSION:  1. Moderate volume of pneumoperitoneum.  No free intraperitoneal spill of contrast or focal gastrointestinal tract wall thickening is identified. A definite etiology for the free intraperitoneal air  is not identified.  A pull-through type feeding tube is present within a loop of jejunum in the left upper quadrant.  Consider correlation with operative/procedure note.  This volume of pneumoperitoneum would not be expected  2 weeks after feeding tube placement (reportedly the procedure was performed approximately 2 weeks ago). 2. Marked prostamegaly.  Cannot exclude prostate carcinoma. Suggest correlation with PSA values.  The urinary bladder is markedly distended and there is mild bilateral hydroureteronephrosis. Findings raise the possibility of bladder outlet obstruction secondary to prostamegaly. 3.  Single locule of gas in the urinary bladder.  Question if there has  been recent Foley catheter placement or other instrumentation to explain this. 4.  Mild urothelial enhancement of the ureters.  Cannot exclude urinary tract infection.  Suggest correlation with urinalysis. 5.  1.5 cm soft tissue nodule in the lower anterior abdomen, directly anterior to the urinary bladder.  This appears new/increased compared to prior studies.  A reactive lymph node or metastatic peritoneal implant cannot be excluded if the patient has an underlying malignancy. 6.  Small volume of pelvic ascites. 7.  Stable left adrenal gland adenoma.   Original Report Authenticated By: Britta Mccreedy, M.D.   Dg Kayleen Memos W/water Sol Cm  05/01/2013   CLINICAL DATA:  Evaluate for gastric leak  EXAM: ESOPHAGUS/BARIUM SWALLOW/TABLET STUDY  TECHNIQUE: Water-soluble contrast was injected into the existing NG tube and imaging was obtained.  COMPARISON:  None.  FLUOROSCOPY TIME:  1 min and 1 2nd.  FINDINGS: Contrast fills the stomach. There is no extravasation of contrast into the peritoneal space. Contrast does fill a fistula to the adjacent jejunum.  IMPRESSION: There is no evidence of intraperitoneal leak of contrast from the stomach. 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   Electronically Signed   By: Maryclare Bean M.D.   On: 05/01/2013 14:51    Medications: . antiseptic oral rinse  15 mL Mouth Rinse q12n4p  . cefTRIAXone (ROCEPHIN)  IV  1 g Intravenous Q24H  . chlorhexidine  15 mL Mouth Rinse BID  . lip balm  1 application Topical BID  . pantoprazole (PROTONIX) IV  40 mg Intravenous Q12H  . potassium chloride  10 mEq Intravenous Q1 Hr x 4  . potassium chloride  40 mEq Per Tube Once    Assessment/Plan Pneumoperitoneum; s/p CVA with PEG placement at [facility] 2 weeks ago.  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 It is all contained. No leak. No obstruction. BRB per  rectum with supratherapeutic INR On chronic coumadin Fever with UTI Hypertension Aphasia   PLan:  Dr. Michaell Cowing said yesterday to continue to use Feeding tube .  He is having multiple loose BM's with a drop in H?H.  Will need to get his INR corrected and may need transfusion.  I will defer to Medicine for this.     LOS: 2 days    [name] [name] 05/02/2013

## 2013-05-02 NOTE — Progress Notes (Signed)
[facility] PROGRESS NOTE  [name] [name] WNU:[id number] DOB: [birth year] DOA: 04/30/2013 PCP: No primary provider on file.  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 He was transferred to the ED on 05/01/13 due to fever (103F) and one episode of bright red bleeding per act him on day of admission. In the ED, hemoglobin 10.3 (at discharge from [facility] was 11), WBC 15.5, Na 148, INR 5.49, chest x-ray: Extensive pneumoperitoneum, CT abdomen and pelvis with contrast: Extensive pneumoperitoneum, enlarged prostate and mild bilateral hydronephrosis. General surgery and GI were consulted. The hospitalist admission requested.    Assessment/Plan:  Rectal bleeding in context of supra therapeutic INR - In the setting of supratherapeutic INR - GI consultation appreciated. Follow up pending - Continue IV PPI, IV fluids and tube feeds on hold. - Follow serial H&H's and transfuse as indicated. Hb has dropped by ~ 2.5gm since admission. - Coumadin held. S/P 1 FFP on 9/21: INR 6.98 >3.46 >3.91. Give additional FFP - GI will decide regarding need for EGD. - Patient had multiple loose dark stools. FOBT pending - Hemodynamically stable  UTI - DC Foley catheter - Continue IV Rocephin pending urine culture results.  Anemia- ABLA on chronic anemia - Hemoglobin drop of about 2.5 g since admission - Follow H&H closely and transfuse if Hb drops any further.  Hypokalemia - IV replete and follow BMP. - Mg OK  Pneumoperitoneum - Currently asymptomatic. No evidence of shock or peritonitis. - Could be secondary to recent tube placement (POD # 20). - Continue conservative management with IV antibiotics, hold tube feeds and monitor. - Surgery  and GI consultation appreciated. CCS cleared for tube feed- but currently on hold due GI bleed issues.  Leukocytosis - Secondary to UTI and stress response. - resolved  Prostatomegaly/urinary retention/mild bilateral hydronephrosis - Continue Foley catheter for now - Outpatient workup i.e. PSA etc. Including urology consultation  Recent large left CVA/right hemiplegia/dysphagia/expressive aphasia - Holding medications secondary to GI bleed.  Hypertension - Stable.  DVT/coagulopathy secondary to Coumadin - Management as above.  DVT prophylaxis: SCDs. Currently coagulopathic. Lines/catheters: PIV.  Nutrition: N.p.o. Tube feeds held.  Activity:  Up with assistance Code Status: Full Family Communication: Left message for brother Disposition Plan: Return to SNF when medically stable.   Consultants:  General surgery  Gastroenterology  Procedures:  Foley catheter - DCed  Has PEG tube from prior to admission.  Antibiotics:  IV Rocephin 9/21 >   Subjective: Denies complaints. Denies abdominal pain. Per nursing, multiple loose dark stools.  Objective: Filed Vitals:   05/01/13 1300 05/01/13 2208 05/02/13 0553 05/02/13 1340  BP: 130/71 139/75 158/73 115/64  Pulse: 75 48 83 63  Temp: 97.9 F (36.6 C) 98.4 F (36.9 C) 99.2 F (37.3 C) 99.9 F (37.7 C)  TempSrc: Oral Oral Oral Oral  Resp: 18 20 20 20   Height:      Weight:      SpO2: 97% 98% 98% 97%    Intake/Output Summary (Last 24 hours) at 05/02/13 1640 Last data filed at 05/02/13 1630  Gross per 24 hour  Intake 3843.33 ml  Output    540 ml  Net 3303.33 ml   Filed Weights   05/01/13 0415  Weight: 83.5 kg (184 lb 1.4 oz)     Exam:  General exam: Moderately built and nourished male patient lying comfortably supine in bed. Respiratory system: Clear. No increased work of breathing. Cardiovascular system: S1 & S2 heard, RRR. No JVD, murmurs, gallops, clicks or pedal edema. Gastrointestinal system:  Abdomen is nondistended, soft and nontender. Normal bowel sounds heard. Tube site unremarkable. Central nervous system: Alert and oriented. Facial asymmetry +. Expressive aphasia. Extremities: 5 x 5 power left extremities. 0 x 5 power right extremities.   Data Reviewed: Basic Metabolic Panel:  Recent Labs Lab 04/26/13 0547 04/30/13 2005 05/01/13 1641 05/02/13 0440  NA 141 148* 151* 145  K 4.0 3.7 3.4* 2.9*  CL 103 110 114* 110  CO2 26 29 29 29   GLUCOSE 133* 124* 101* 131*  BUN 35* 27* 16 15  CREATININE 1.83* 1.29 0.82 0.81  CALCIUM 9.7 9.3 9.6 9.2  MG  --   --   --  2.3   Liver Function Tests:  Recent Labs Lab 04/30/13 2005  AST 33  ALT 104*  ALKPHOS 54  BILITOT 0.6  PROT 7.0  ALBUMIN 2.4*   No results found for this basename: LIPASE, AMYLASE,  in the last 168 hours No results found for this basename: AMMONIA,  in the last 168 hours CBC:  Recent Labs Lab 04/26/13 0547 04/30/13 2005 05/01/13 0507 05/01/13 1641 05/02/13 0440 05/02/13 1035  WBC 11.0* 15.5* 15.0* 12.4* 8.6 7.5  NEUTROABS  --  12.8*  --   --   --   --   HGB 11.9* 10.3* 9.3* 9.2* 7.8* 7.9*  HCT 36.5* 32.0* 28.8* 28.5* 24.2* 24.4*  MCV 98.9 99.4 99.7 99.7 98.4 98.0  PLT 168 214 204 230 215 226   Cardiac Enzymes: No results found for this basename: CKTOTAL, CKMB, CKMBINDEX, TROPONINI,  in the last 168 hours BNP (last 3 results) No results found for this basename: PROBNP,  in the last 8760 hours CBG: No results found for this basename: GLUCAP,  in the last 168 hours  Recent Results (from the past 240 hour(s))  URINE CULTURE     Status: None   Collection Time    04/30/13  7:06 PM      Result Value Range Status   Specimen Description URINE, CATHETERIZED   Final   Special Requests NONE   Final   Culture  Setup Time     Final   Value: 05/01/2013 02:26     Performed at [facility] Count PENDING   Incomplete   Culture     Final   Value: Culture reincubated for better growth      Performed at [facility]   Report Status PENDING   Incomplete  MRSA PCR SCREENING     Status: None   Collection Time    05/01/13  6:39 AM      Result Value Range Status   MRSA by PCR NEGATIVE  NEGATIVE Final   Comment:            The GeneXpert MRSA Assay (FDA     approved for NASAL specimens     only), is one component of a     comprehensive MRSA colonization     surveillance program. It is not     intended to diagnose MRSA     infection nor to guide or     monitor treatment for     MRSA infections.  CULTURE, BLOOD (ROUTINE X 2)     Status: None   Collection Time  05/01/13 12:55 PM      Result Value Range Status   Specimen Description BLOOD LEFT ARM   Final   Special Requests BOTTLES DRAWN AEROBIC AND ANAEROBIC 1.5 CC EACH   Final   Culture  Setup Time     Final   Value: 05/01/2013 21:44     Performed at [facility]   Culture     Final   Value:        BLOOD CULTURE RECEIVED NO GROWTH TO DATE CULTURE WILL BE HELD FOR 5 DAYS BEFORE ISSUING A FINAL NEGATIVE REPORT     Performed at [facility]   Report Status PENDING   Incomplete  CULTURE, BLOOD (ROUTINE X 2)     Status: None   Collection Time    05/01/13  1:05 PM      Result Value Range Status   Specimen Description BLOOD LEFT ARM   Final   Special Requests BOTTLES DRAWN AEROBIC AND ANAEROBIC 6 CC EACH   Final   Culture  Setup Time     Final   Value: 05/01/2013 21:44     Performed at [facility]   Culture     Final   Value:        BLOOD CULTURE RECEIVED NO GROWTH TO DATE CULTURE WILL BE HELD FOR 5 DAYS BEFORE ISSUING A FINAL NEGATIVE REPORT     Performed at [facility]   Report Status PENDING   Incomplete  CLOSTRIDIUM DIFFICILE BY PCR     Status: None   Collection Time    05/01/13  9:20 PM      Result Value Range Status   C difficile by pcr NEGATIVE  NEGATIVE Final   Comment: Performed at [facility]      Additional labs: 1. INR: 6.98>  3.91      Studies: Dg Chest 2 View  04/30/2013   CLINICAL DATA:  Pain and fever  EXAM: CHEST  2 VIEW  COMPARISON:  None.  FINDINGS: There is extensive pneumoperitoneum. There is no edema or consolidation. Heart is upper normal in size with normal pulmonary vascularity. No adenopathy. No bone lesions.  IMPRESSION: Pneumoperitoneum.  No edema or consolidation.  CriticalValue/emergent results were called by telephone at the time of interpretation on 04/30/2013 at 8:18 PMto the emergency department physician covering [facility], who verbally acknowledged these results.   Electronically Signed   By: Bretta Bang   On: 04/30/2013 20:20   Dg Abd 1 View  05/01/2013   CLINICAL DATA:  Evaluate for gastric leak  EXAM: ABDOMEN - 1 VIEW  COMPARISON:  CT abdomen pelvis dated 05/01/2013  FINDINGS: Contrast was administered via the malpositioned gastrostomy tube (currently located within the jejunum).  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 A corresponding abnormality is present on CT (series 7/images 10-14).  No free extraluminal contrast is noted.  IMPRESSION: Malpositioned gastrostomy tube within the jejunum.  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  No free extraluminal contrast is noted.   Electronically Signed   By: Charline Bills M.D.   On: 05/01/2013 13:37   Dg Abd 1 View  04/30/2013   CLINICAL DATA:  Check PEG tube placement  EXAM: ABDOMEN - 1 VIEW  COMPARISON:  None.  FINDINGS: Contrast administered via left-sided feeding tube opacifies nondilated small bowel. There are some other high-density areas in the abdomen which are likely within bowel - present  in all 4 quadrants. Pneumoperitoneum better seen previously. Nonobstructive bowel gas pattern. No abnormal intra-abdominal mass effect. Clear lung bases. Focally advanced degenerative disc changes at L4-5.  IMPRESSION: 1. Jejunostomy tube  injection opacifies small bowel. Small collections of contrast elsewhere in the abdomen are favored intraluminal, which could be confirmed with CT if there is ongoing suspicion for tube leakage.  2.  Pneumoperitoneum.   Electronically Signed   By: Tiburcio Pea   On: 04/30/2013 23:15   Ct Abdomen Pelvis W Contrast  05/01/2013   *RADIOLOGY REPORT*  Clinical Data: GI bleeding, fever, blood and air  CT ABDOMEN AND PELVIS WITH CONTRAST  Technique:  Multidetector CT imaging of the abdomen and pelvis was performed following the standard protocol during bolus administration of intravenous contrast.  Contrast: OMNIPAQUE IOHEXOL 300 MG/ML  SOLN  Comparison: CT abdomen pelvis with and without contrast 11/03/2011 and MR abdomen 06/30/2003.  Findings: Lung bases:  There is mild bibasilar atelectasis. Calcified granuloma in the left lower lobe.  Negative for pleural effusion.  Negative for pneumothorax at the lung bases.  There is a moderate amount of pneumoperitoneum, predominately seen in the nondependent portion of the abdomen, with multiple tiny locules scattered throughout the upper abdomen bilaterally.  A pull-through type feeding tube traverses the left anterior abdominal wall and is positioned within a jejunal loop in the left upper quadrant.  Oral contrast was administered via the feeding tube, and by the time the exam was performed, the contrast has progressed throughout the small bowel loops distal to the feeding tube, and into the colon, to the level of the mid sigmoid colon. No free intraperitoneal spill of oral contrast is seen.  The stomach is decompressed.  The duodenum is unremarkable.  No free air is seen posterior to the stomach, or adjacent to the duodenum.  2.3 x 2.7 cm left adrenal gland nodule is stable.  This is previously been evaluated with MRI in 2004, and shown to be most consistent with a benign adenoma.  The patient has a markedly enlarged prostate gland (5.8 centers AP diameter by 6.4 cm  transverse diameter by 6.1 cm craniocaudal span. The patient's urinary bladder is distended, and there is mild bilateral hydroureteronephrosis.  These findings raise the possibility of possible bladder outlet obstruction related to the prostamegaly. There is a single tiny locule of gas in the nondependent portion of the urinary bladder.  Urinary bladder wall is normal in thickness.  There is a soft tissue nodule in the lower anterior abdomen, directly anterior to the anterior wall of the distended urinary bladder there is not deftly present on prior CT. This measures 1.5 x1.8 x 0.7 cm.  There is suggestion of mild urothelial enhancement of both ureters. Suggest correlation with urinalysis to exclude urinary tract infection.  Bilateral low density renal lesions appear without significant change in most consistent with benign cysts.  The liver, gallbladder, spleen, right adrenal gland, and pancreas are within normal limits.  Sigmoid colon diverticulosis is noted, without acute inflammatory change. There are a few locules of gas just inferior to the cecum, and there is some fluid in this region of the right lower quadrant. There is also a small amount of ascites in the dependent portion of the pelvis.  The appendix is normal.  Abdominal aorta normal in caliber.  Negative for lymphadenopathy.  No acute bony abnormalities identified.  No suspicious osseous lesion.  IMPRESSION:  1. Moderate volume of pneumoperitoneum.  No free intraperitoneal spill of contrast or  focal gastrointestinal tract wall thickening is identified. A definite etiology for the free intraperitoneal air is not identified.  A pull-through type feeding tube is present within a loop of jejunum in the left upper quadrant.  Consider correlation with operative/procedure note.  This volume of pneumoperitoneum would not be expected  2 weeks after feeding tube placement (reportedly the procedure was performed approximately 2 weeks ago). 2. Marked prostamegaly.   Cannot exclude prostate carcinoma. Suggest correlation with PSA values.  The urinary bladder is markedly distended and there is mild bilateral hydroureteronephrosis. Findings raise the possibility of bladder outlet obstruction secondary to prostamegaly. 3.  Single locule of gas in the urinary bladder.  Question if there has been recent Foley catheter placement or other instrumentation to explain this. 4.  Mild urothelial enhancement of the ureters.  Cannot exclude urinary tract infection.  Suggest correlation with urinalysis. 5.  1.5 cm soft tissue nodule in the lower anterior abdomen, directly anterior to the urinary bladder.  This appears new/increased compared to prior studies.  A reactive lymph node or metastatic peritoneal implant cannot be excluded if the patient has an underlying malignancy. 6.  Small volume of pelvic ascites. 7.  Stable left adrenal gland adenoma.   Original Report Authenticated By: Britta Mccreedy, M.D.   Dg Kayleen Memos W/water Sol Cm  05/01/2013   CLINICAL DATA:  Evaluate for gastric leak  EXAM: ESOPHAGUS/BARIUM SWALLOW/TABLET STUDY  TECHNIQUE: Water-soluble contrast was injected into the existing NG tube and imaging was obtained.  COMPARISON:  None.  FLUOROSCOPY TIME:  1 min and 1 2nd.  FINDINGS: Contrast fills the stomach. There is no extravasation of contrast into the peritoneal space. Contrast does fill a fistula to the adjacent jejunum.  IMPRESSION: There is no evidence of intraperitoneal leak of contrast from the stomach. 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   Electronically Signed   By: Maryclare Bean M.D.   On: 05/01/2013 14:51        Scheduled Meds: . antiseptic oral rinse  15 mL Mouth Rinse q12n4p  . cefTRIAXone (ROCEPHIN)  IV  1 g Intravenous Q24H  . chlorhexidine  15 mL Mouth Rinse BID  . lip balm  1 application Topical BID  . pantoprazole (PROTONIX) IV  40 mg Intravenous Q12H  . potassium  chloride  10 mEq Intravenous Q1 Hr x 4  . potassium chloride  40 mEq Per Tube Once   Continuous Infusions: . dextrose 5 % with kcl 100 mL/hr at 05/02/13 1020    Principal Problem:   Pneumoperitoneum Active Problems:   Lower GI bleed   UTI (urinary tract infection)   Supratherapeutic INR   Chronic indwelling Foley catheter   Jejunostomy tube present (?originally G tube?)   Laceration of eyebrow s/p repair 04/26/2013   Hemiparesis affecting right side as late effect of stroke   Dysarthria as late effect of stroke   DVT of right axillary vein, acute   Anemia    Time spent: 40 minutes.    [employer] [facility]  [facility] Pager [phone].   If 8PM-8AM, please contact night-coverage at [email or web address], password [facility] 05/02/2013, 4:40 PM  LOS: 2 days

## 2013-05-02 NOTE — Evaluation (Signed)
Physical Therapy Evaluation Patient Details Name: [name] [name] MRN: [id number] DOB: November 30, 1955 Today's Date: 05/02/2013 Time: 2536-6440 PT Time Calculation (min): 29 min  PT Assessment / Plan / Recommendation History of Present Illness  57 year old male with history of hypertension, history of DVT on Coumadin who was admitted to [facility] recently with an acute stroke involving a large area on the left side with residual right hemiplegia and dysphasia was discharged to skilled nursing facility. He had a PEG tube placed in the hospital due to dysphagia. Patient sent from there as he had a fever with one episode of bright red blood per rectum on the day of admission. History primarily provided by brother at bedside and from ED notes.. Mother also reports the patient was complaining of some abdominal pain for the last 1 week. Patient was noted to have a fever of 103F in the nursing home and with this into the ED.   Clinical Impression  Pt will benefit from PT to maximize independence/address deficits below    PT Assessment  Patient needs continued PT services    Follow Up Recommendations  SNF    Does the patient have the potential to tolerate intense rehabilitation      Barriers to Discharge        Equipment Recommendations  None recommended by PT    Recommendations for Other Services     Frequency Min 3X/week    Precautions / Restrictions Precautions Precaution Comments: right hemiplegia   Pertinent Vitals/Pain States he is hurting and sore "all over"      Mobility  Bed Mobility Bed Mobility: Right Sidelying to Sit;Rolling Right;Sitting - Scoot to Edge of Bed;Sit to Sidelying Right Rolling Right: 3: Mod assist;With rail Right Sidelying to Sit: 1: +2 Total assist Right Sidelying to Sit: Patient Percentage: 30% Supine to Sit: HOB elevated Sitting - Scoot to Edge of Bed: 1: +2 Total assist Sitting - Scoot to Edge of Bed: Patient Percentage: 40% Sit to Sidelying  Right: 1: +2 Total assist Sit to Sidelying Right: Patient Percentage: 30% Details for Bed Mobility Assistance: cues for technique, +2 for lines, safety, trunk control and RUE/LE Transfers Transfers: Not assessed    Exercises     PT Diagnosis: Generalized weakness;Hemiplegia dominant side  PT Problem List: Decreased strength;Decreased activity tolerance;Decreased balance;Decreased mobility;Decreased safety awareness;Impaired tone PT Treatment Interventions: Functional mobility training;Therapeutic activities;Therapeutic exercise;Patient/family education;Neuromuscular re-education;Balance training     PT Goals(Current goals can be found in the care plan section) Acute Rehab PT Goals Patient Stated Goal: to get stronger PT Goal Formulation: With patient Time For Goal Achievement: 05/16/13 Potential to Achieve Goals: Fair  Visit Information  Last PT Received On: 05/02/13 Assistance Needed: +2 History of Present Illness: 57 year old male with history of hypertension, history of DVT on Coumadin who was admitted to [facility] recently with an acute stroke involving a large area on the left side with residual right hemiplegia and dysphasia was discharged to skilled nursing facility. He had a PEG tube placed in the hospital due to dysphagia. Patient sent from there as he had a fever with one episode of bright red blood per rectum on the day of admission. History primarily provided by brother at bedside and from ED notes.. Mother also reports the patient was complaining of some abdominal pain for the last 1 week. Patient was noted to have a fever of 103F in the nursing home and with this into the ED.        Prior Functioning  Home  Living Family/patient expects to be discharged to:: Skilled nursing facility Additional Comments: ?? pt states his brother is his caregiver  Prior Function Level of Independence: Needs assistance Gait / Transfers Assistance Needed: pt reports he was doing some  standing with therapy at SNF Communication Communication: Other (comment) (dysarthric)    Cognition  Cognition Arousal/Alertness: Awake/alert Behavior During Therapy: WFL for tasks assessed/performed Overall Cognitive Status: Within Functional Limits for tasks assessed    Extremity/Trunk Assessment Upper Extremity Assessment Upper Extremity Assessment: RUE deficits/detail RUE Deficits / Details: PROM WFL, decreased tone throughout, pain free Lower Extremity Assessment Lower Extremity Assessment: RLE deficits/detail;LLE deficits/detail RLE Deficits / Details: mild tone right LE, PROM WFL, pain free LLE Deficits / Details: grossly 3 to 3+/5   Balance Static Sitting Balance Static Sitting - Balance Support: Feet supported Static Sitting - Level of Assistance: 3: Mod assist;4: Min assist;2: Max assist;1: +2 Total assist Static Sitting - Comment/# of Minutes: variable levels of assist; sat EOB x 10 min with RUE supported on pillows; able to wipe mouth with cloth in Left hand an min assist for trunk control  End of Session PT - End of Session Activity Tolerance: Patient tolerated treatment well Patient left: in bed;with call bell/phone within reach Nurse Communication: Mobility status  GP     [facility] 05/02/2013, 11:43 AM

## 2013-05-02 NOTE — Progress Notes (Deleted)
OT  Note  Patient Details Name: [name] MRN: [id number] DOB: 1956-04-10   Cancelled Treatment:    Reason Eval/Treat Not Completed: Other (comment) (Noted pt from SNF returning to SNF. Defer OT eval to SNF)  Rafi Kenneth, [place] Kung 05/02/2013, 9:14 AM

## 2013-05-02 NOTE — Progress Notes (Signed)
Pt refusing lab to draw his blood at this time. Despite attempt to explain the importance of the labwork to be drawn, pt still refuses but does agree to allow [place] to draw next set that is ordered in 6 hours. Will continue to monitor pt at this time.

## 2013-05-03 ENCOUNTER — Ambulatory Visit (HOSPITAL_COMMUNITY): Admission: RE | Admit: 2013-05-03 | Payer: BC Managed Care – PPO | Source: Ambulatory Visit

## 2013-05-03 ENCOUNTER — Other Ambulatory Visit (HOSPITAL_COMMUNITY): Payer: Self-pay

## 2013-05-03 DIAGNOSIS — E876 Hypokalemia: Secondary | ICD-10-CM

## 2013-05-03 LAB — BASIC METABOLIC PANEL
CO2: 27 mEq/L (ref 19–32)
Chloride: 105 mEq/L (ref 96–112)
Creatinine, Ser: 0.84 mg/dL (ref 0.50–1.35)
GFR calc Af Amer: >90 mL/min (ref >90)
GFR calc non Af Amer: >90 mL/min (ref >90)
Potassium: 3.2 mEq/L — ABNORMAL LOW (ref 3.5–5.1)

## 2013-05-03 LAB — PROTIME-INR
INR: 3.23 — ABNORMAL HIGH (ref 0.00–1.49)
Prothrombin Time: 31.8 seconds — ABNORMAL HIGH (ref 11.6–15.2)

## 2013-05-03 LAB — GLUCOSE, CAPILLARY
Glucose-Capillary: 126 mg/dL — ABNORMAL HIGH (ref 70–99)
Glucose-Capillary: 125 mg/dL — ABNORMAL HIGH (ref 70–99)

## 2013-05-03 LAB — URINE CULTURE

## 2013-05-03 LAB — CBC
Hemoglobin: 8.2 g/dL — ABNORMAL LOW (ref 13.0–17.0)
MCHC: 32.9 g/dL (ref 30.0–36.0)
MCV: 96.5 fL (ref 78.0–100.0)
Platelets: 253 10*3/uL (ref 150–400)
RDW: 13 % (ref 11.5–15.5)
WBC: 7.3 10*3/uL (ref 4.0–10.5)

## 2013-05-03 LAB — PREPARE FRESH FROZEN PLASMA: Unit division: 0

## 2013-05-03 MED ORDER — OSMOLITE 1.5 CAL PO LIQD
1000.0000 mL | ORAL | Status: DC
  Administered 2013-05-03 – 2013-05-05 (×3): 1000 mL
  Filled 2013-05-03 (×4): qty 1000

## 2013-05-03 MED ORDER — PRO-STAT SUGAR FREE PO LIQD
30.0000 mL | Freq: Every day | ORAL | Status: DC
  Administered 2013-05-03 – 2013-05-04 (×2): 30 mL
  Filled 2013-05-03 (×4): qty 30

## 2013-05-03 MED ORDER — POTASSIUM CHLORIDE 20 MEQ PO PACK
40.0000 meq | PACK | Freq: Once | ORAL | Status: DC
  Filled 2013-05-03: qty 2

## 2013-05-03 MED ORDER — POTASSIUM CHLORIDE 20 MEQ/15ML (10%) PO LIQD
40.0000 meq | Freq: Once | ORAL | Status: AC
  Administered 2013-05-03: 40 meq
  Filled 2013-05-03 (×2): qty 30

## 2013-05-03 NOTE — Progress Notes (Signed)
SLP received order for bedside swallow evaluation.  Pt had been scheduled for outpt MBS today at 1300 originally.  Due to pt h/o dysphagia s/p CVA, recommend pursue MBS next date.  MD agreeable, pt, pt's sister, informed and agreeable.  Potentially scheduled for MBS @ 830 am Wed 9/24.    Thanks for this consult.  Donavan Burnet, MS [facility] SLP [phone]

## 2013-05-03 NOTE — ED Provider Notes (Signed)
Medical screening examination/treatment/procedure(s) were performed by non-physician practitioner and as supervising physician I was immediately available for consultation/collaboration.  Layla Maw Vihana Kydd, DO 05/03/13 [phone]

## 2013-05-03 NOTE — Progress Notes (Signed)
NUTRITION FOLLOW-UP/CONSULT  INTERVENTION: Initiate Osmolite 1.5 at 20 ml/hr via enteral feeding tube, advance by 10 ml q 8 hours, to goal of 50 ml/hr. Add 30 ml Prostat daily via tube. This regimen will provide: 1900 kcal, 91 grams protein, 914 ml free water. Once IVF discontinued, recommend resuming free water flushes of 200 ml free water via PEG 5 times daily. This will provide an additional 1000 ml free water daily. RD to continue to follow nutrition care plan.  *Note: If bolus regimen desired, resume 8 oz (1 can) Osmolite 1.5 five times daily. Add 30 ml Prostat via tube. This regimen will provide: 1875 kcal, 90 grams protein, 905 ml free water.*  NUTRITION DIAGNOSIS: Inadequate oral intake related to inability to eat as evidenced by NPO status, need for PEG feedings for nutrition.   Goal: Resume enteral feedings per team. Met. Intake to meet at least 90% of estimated needs.  Monitor:  weight trends, lab trends, I/O's, enteral nutrition resumption and tolerance  ASSESSMENT: PMHx significant for CVA, PEG placement x 2 weeks ago. Pt is from SNF. Admitted with bloody stools and fever. Work-up reveals pneumoperitoneum.  The feeding tube was evaluated on admission and was found to be located in a loop of jejunum without leakage of contrast. Surgery recommending resumption of PEG for feedings per 9/21 note. RD consulted to resume TF this morning. RD spoke with [name] [name], will start continuous feedings at this time.  Per chart review, PTA, pt was receiving 1 can of Osmolite 1.5, 5 times daily with 100 ml free water flushes before and after each feeding. This regimen provides 1775 kcal, 75 grams protein, 1905 ml free water daily.  Pt is at nutrition risk given recent CVA and possible prior weight loss.   Height: Ht Readings from Last 1 Encounters:  05/01/13 5\' 11"  (1.803 m)    Weight: Wt Readings from Last 1 Encounters:  05/01/13 184 lb 1.4 oz (83.5 kg)   BMI:  Body mass index is  25.69 kg/(m^2). Overweight.  Estimated Nutritional Needs: Kcal: 1750 - 1950 Protein: 83 - 99 g  Fluid: 1.8 - 2 liters daily  Skin: MASD to buttocks  Diet Order: NPO  EDUCATION NEEDS: -No education needs identified at this time   Intake/Output Summary (Last 24 hours) at 05/03/13 1016 Last data filed at 05/03/13 0600  Gross per 24 hour  Intake   2750 ml  Output   1640 ml  Net   1110 ml    Last BM: 9/22  Labs:   Recent Labs Lab 05/01/13 1641 05/02/13 0440 05/02/13 1708 05/03/13 0210  NA 151* 145 141 139  K 3.4* 2.9* 3.5 3.2*  CL 114* 110 106 105  CO2 29 29 27 27   BUN 16 15 13 12   CREATININE 0.82 0.81 0.82 0.84  CALCIUM 9.6 9.2 9.0 9.3  MG  --  2.3  --   --   GLUCOSE 101* 131* 116* 97    CBG (last 3)  No results found for this basename: GLUCAP,  in the last 72 hours  Scheduled Meds: . antiseptic oral rinse  15 mL Mouth Rinse q12n4p  . cefTRIAXone (ROCEPHIN)  IV  1 g Intravenous Q24H  . chlorhexidine  15 mL Mouth Rinse BID  . lip balm  1 application Topical BID  . pantoprazole (PROTONIX) IV  40 mg Intravenous Q12H  . potassium chloride  40 mEq Per Tube Once    Continuous Infusions: . dextrose 5 % with kcl 100 mL/hr  at 05/02/13 2044   [name] Motto MS, RD, LDN Pager: [phone] After-hours pager: [phone]

## 2013-05-03 NOTE — Progress Notes (Signed)
Subjective: His only complaint is some chest pain left upper chest which you can touch and palpation affects it.  NO SOB.  No abdominal complaints.  Objective: Vital signs in last 24 hours: Temp:  [98.8 F (37.1 C)-99.9 F (37.7 C)] 98.8 F (37.1 C) (09/23 0459) Pulse Rate:  [63-77] 69 (09/23 0459) Resp:  [16-20] 20 (09/23 0459) BP: (115-129)/(64-74) 128/67 mmHg (09/23 0459) SpO2:  [97 %-100 %] 100 % (09/23 0459) Last BM Date: 05/02/13 3 stools recorded yesterday, NPO Afebrile, VSS CBC is stable, K+  down some again,  INR still up 3.23 Intake/Output from previous day: 09/22 0701 - 09/23 0700 In: 2750 [I.V.:2300; IV Piggyback:450] Out: 1640 [Urine:1640] Intake/Output this shift:    General appearance: alert, cooperative and no distress Chest wall: no tenderness, left lateral uppper anterior chest discomfort, I can palpate site and he say it affects discomfort. GI: soft, non-tender; bowel sounds normal; no masses,  no organomegaly and PEG site looks fine.  Lab Results:   Recent Labs  05/02/13 1711 05/03/13 0210  WBC 7.7 7.3  HGB 8.3* 8.2*  HCT 25.7* 24.9*  PLT 253 253    BMET  Recent Labs  05/02/13 1708 05/03/13 0210  NA 141 139  K 3.5 3.2*  CL 106 105  CO2 27 27  GLUCOSE 116* 97  BUN 13 12  CREATININE 0.82 0.84  CALCIUM 9.0 9.3   PT/INR  Recent Labs  05/02/13 0440 05/03/13 0210  LABPROT 36.8* 31.8*  INR 3.91* 3.23*     Recent Labs Lab 04/30/13 2005  AST 33  ALT 104*  ALKPHOS 54  BILITOT 0.6  PROT 7.0  ALBUMIN 2.4*     Lipase  No results found for this basename: lipase     Studies/Results: Dg Abd 1 View  05/01/2013   CLINICAL DATA:  Evaluate for gastric leak  EXAM: ABDOMEN - 1 VIEW  COMPARISON:  CT abdomen pelvis dated 05/01/2013  FINDINGS: Contrast was administered via the malpositioned gastrostomy tube (currently located within the jejunum).  Retrograde opacification of the stomach is present via a suspected gastrojejunal  fistulous tract. A corresponding abnormality is present on CT (series 7/images 10-14).  No free extraluminal contrast is noted.  IMPRESSION: Malpositioned gastrostomy tube within the jejunum.  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  No free extraluminal contrast is noted.   Electronically Signed   By: Charline Bills M.D.   On: 05/01/2013 13:37   Dg Kayleen Memos W/water Sol Cm  05/01/2013   CLINICAL DATA:  Evaluate for gastric leak  EXAM: ESOPHAGUS/BARIUM SWALLOW/TABLET STUDY  TECHNIQUE: Water-soluble contrast was injected into the existing NG tube and imaging was obtained.  COMPARISON:  None.  FLUOROSCOPY TIME:  1 min and 1 2nd.  FINDINGS: Contrast fills the stomach. There is no extravasation of contrast into the peritoneal space. Contrast does fill a fistula to the adjacent jejunum.  IMPRESSION: There is no evidence of intraperitoneal leak of contrast from the stomach. 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   Electronically Signed   By: Maryclare Bean M.D.   On: 05/01/2013 14:51    Medications: . antiseptic oral rinse  15 mL Mouth Rinse q12n4p  . cefTRIAXone (ROCEPHIN)  IV  1 g Intravenous Q24H  . chlorhexidine  15 mL Mouth Rinse BID  . lip balm  1 application Topical BID  . pantoprazole (PROTONIX) IV  40 mg Intravenous  Q12H  . potassium chloride  40 mEq Per Tube Once   . dextrose 5 % with kcl 100 mL/hr at 05/02/13 2044     Assessment/Plan Pneumoperitoneum; s/p CVA with PEG placement at [facility] 2 weeks ago.  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 It is all contained. No leak. No obstruction.  BRB per rectum with supratherapeutic INR  On chronic coumadin for DVT Fever with UTI  Hypertension  Aphasia/dysphagia   PLan;  Medicine is dealing with INR, Dr. Loreta Ave agrees with starting TF and this will be done today.   We will follow.   LOS: 3 days    Dnaiel Voller 05/03/2013

## 2013-05-03 NOTE — Progress Notes (Signed)
Physical Therapy Treatment Patient Details Name: [name] [name] MRN: [id number] DOB: 09-29-1955 Today's Date: 05/03/2013 Time: 7829-5621 PT Time Calculation (min): 41 min  PT Assessment / Plan / Recommendation  History of Present Illness 57 year old male with history of hypertension, history of DVT on Coumadin who was admitted to [facility] recently with an acute stroke involving a large area on the left side with residual right hemiplegia and dysphasia was discharged to skilled nursing facility. He had a PEG tube placed in the hospital due to dysphagia. Patient sent from there as he had a fever with one episode of bright red blood per rectum on the day of admission. History primarily provided by brother at bedside and from ED notes.. Mother also reports the patient was complaining of some abdominal pain for the last 1 week. Patient was noted to have a fever of 103F in the nursing home and with this into the ED.    PT Comments   Pt is AxOx3 and very willing to participate.  Pt is difficult to understand due to Dysarthria.  Pt required + 2 assist to tarnsition from supine to EOB.  Sat EOB x 3 min requiring Max Assist for support due to severe forward?right lean.  Assisted pt to [facility] "Bear Hug" stand pivot + 2 assist (front/back).  Assisted to recliner same tech.  Pt demon poor trunk control as well as R hemiparesis.  Hoyer pad placed in recliner for nursing to use lift to return pt back to bed.  Reported to RN.   Follow Up Recommendations  SNF     Does the patient have the potential to tolerate intense rehabilitation     Barriers to Discharge        Equipment Recommendations       Recommendations for Other Services    Frequency Min 3X/week   Progress towards PT Goals Progress towards PT goals: Progressing toward goals (slowly)  Plan      Precautions / Restrictions Precautions Precautions: Fall Precaution Comments: right hemiplegia and recent PEG Restrictions Weight Bearing  Restrictions: No    Pertinent Vitals/Pain "It hurts when I pee"     Mobility  Bed Mobility Bed Mobility: Rolling Right;Right Sidelying to Sit;Sitting - Scoot to [place] of Bed Rolling Right: 1: +2 Total assist Rolling Right: Patient Percentage: 40% Right Sidelying to Sit: 1: +2 Total assist Right Sidelying to Sit: Patient Percentage: 10% Sitting - Scoot to Edge of Bed: 1: +2 Total assist Sitting - Scoot to Edge of Bed: Patient Percentage: 10% Details for Bed Mobility Assistance: 50% VC's on proper tech and use of rail with increased time. Transfers Transfers: Editor, commissioning Transfers: 1: +2 Total assist Stand Pivot Transfers: Patient Percentage: 10% Details for Transfer Assistance: Assisted pt OOB to [facility] using "bear Hug" stand pivot tech towards pt's stronger side 1/4 incomplete turn pt only 10%.   Ambulation/Gait Ambulation/Gait Assistance Details: Unable to attempt 2nd low level during transfer     PT Goals (current goals can now be found in the care plan section)    Visit Information  Last PT Received On: 05/03/13 Assistance Needed: +2 History of Present Illness: 57 year old male with history of hypertension, history of DVT on Coumadin who was admitted to [facility] recently with an acute stroke involving a large area on the left side with residual right hemiplegia and dysphasia was discharged to skilled nursing facility. He had a PEG tube placed in the hospital due to dysphagia. Patient sent from there as  he had a fever with one episode of bright red blood per rectum on the day of admission. History primarily provided by brother at bedside and from ED notes.. Mother also reports the patient was complaining of some abdominal pain for the last 1 week. Patient was noted to have a fever of 103F in the nursing home and with this into the ED.     Subjective Data      Cognition       Balance     End of Session PT - End of Session Equipment Utilized During  Treatment: Gait belt Activity Tolerance: Patient tolerated treatment well Patient left: in chair;with call bell/phone within reach Nurse Communication: Need for lift equipment   Felecia Shelling  PTA [facility]  Acute  Rehab Pager      [phone]

## 2013-05-03 NOTE — Progress Notes (Signed)
[facility] PROGRESS NOTE  [name] [name] AOZ:[id number] DOB: 1955/10/13 DOA: 04/30/2013 PCP: No primary provider on file.  HPI/Brief narrative 57 year old male patient with history of hypertension, DVT on Coumadin, recent admission to [facility] approximately a month ago with a large left CVA and resultant right hemiplegia, neurogenic dysphagia and expressive aphasia, PEG tube placed 04/12/13 and was discharged to SNF. He was transferred to the ED on 05/01/13 due to fever (103F) and one episode of bright red bleeding per act him on day of admission. In the ED, hemoglobin 10.3 (at discharge from [facility] was 11), WBC 15.5, Na 148, INR 5.49, chest x-ray: Extensive pneumoperitoneum, CT abdomen and pelvis with contrast: Extensive pneumoperitoneum, enlarged prostate and mild bilateral hydronephrosis. General surgery and GI were consulted. Her pneumoperitoneum is believed to be secondary to PEG tube placement and has been cleared for feeding by surgery. Coagulopathy was partially reversed with FFP x3. Hemoglobin initially dropped by about 2.5 g but seems to have stabilized and no further BMs since 9/22 AM. Starting tube feeds on 9/23 and requesting swallow eval.   Assessment/Plan:  Rectal bleeding in context of supra therapeutic INR - In the setting of supratherapeutic INR - GI consultation & followup appreciated. GI recommends resuming tube feeds. Discussed with Dr. Jeani Hawking on 9/23 who advised starting tube feeds, no EGD at this time unless recurrent bleeding, and recommends resuming Coumadin as INR dictates. - Continue IV PPI, IV fluids. - Follow serial H&H's and transfuse as indicated. Hb has dropped by ~ 2.5gm since admission but has stabilized. - Coumadin held. Status post 3 units FFP since admission. INR 3.23. Coagulopathy probably made worse secondary to n.p.o. status. - Patient had multiple loose dark stools on 9/21. No BM since 9/22 AM. FOBT pending - Hemodynamically  stable  UTI - Continue IV Rocephin. - Urine cultures adjust contaminated sample.  Anemia- ABLA on chronic anemia - Hemoglobin drop of about 2.5 g since admission - Follow H&H closely and transfuse if Hb drops any further. - Stable hemoglobin over the last 24 hours.  Hypokalemia - IV replete and follow BMP. - Mg OK  Pneumoperitoneum - Currently asymptomatic. No evidence of shock or peritonitis. - Could be secondary to recent tube placement (POD # 20). - Continue conservative management with IV antibiotics, hold tube feeds and monitor. - Surgery and GI consultation appreciated. CCS cleared for tube feed.  Leukocytosis - Secondary to UTI and stress response. - resolved  Prostatomegaly/urinary retention/mild bilateral hydronephrosis - Continue Foley catheter for now and may consider discharging on same until outpatient urology consultation. - Outpatient workup i.e. PSA etc. Including urology consultation  Recent large left CVA/right hemiplegia/dysphagia/expressive aphasia/right axillary vein DVT per GI note - Holding medications secondary to GI bleed. - Consider resuming Coumadin when INR comes down to 2.5 range. - Unclear if on Coumadin for DVT alone (if then? Duration) or for CVA too. This needs to be clarified. - Per family, patient was apparently supposed to have repeat swallow eval sometime this week. Will request ST consultation.  Hypertension - Stable.  DVT/coagulopathy secondary to Coumadin - Management as above.  DVT prophylaxis: SCDs. Currently coagulopathic. Lines/catheters: PIV.  Nutrition: Resume tube feeds 9/23 Activity:  Up with assistance Code Status: Full Family Communication: Left message for brother 9/22 Disposition Plan: Return to SNF when medically stable.   Consultants:  General surgery  Gastroenterology  Procedures:  Foley catheter  Has PEG tube from prior to admission.  Antibiotics:  IV Rocephin 9/21 >  Subjective: Denies  complaints. Per nursing, no BM since yesterday morning.   Objective: Filed Vitals:   05/02/13 2250 05/03/13 0459 05/03/13 0830 05/03/13 0920  BP: 128/68 128/67 133/81 134/79  Pulse: 64 69 60 60  Temp: 99 F (37.2 C) 98.8 F (37.1 C) 98.2 F (36.8 C) 98.7 F (37.1 C)  TempSrc: Oral Oral Oral Oral  Resp: 16 20 18 18   Height:      Weight:      SpO2:  100% 100% 100%    Intake/Output Summary (Last 24 hours) at 05/03/13 1226 Last data filed at 05/03/13 0600  Gross per 24 hour  Intake   2550 ml  Output   1640 ml  Net    910 ml   Filed Weights   05/01/13 0415  Weight: 83.5 kg (184 lb 1.4 oz)     Exam:  General exam: Moderately built and nourished male patient lying comfortably supine in bed. Respiratory system: Clear. No increased work of breathing. Cardiovascular system: S1 & S2 heard, RRR. No JVD, murmurs, gallops, clicks or pedal edema. Telemetry: Sinus rhythm Gastrointestinal system: Abdomen is nondistended, soft and nontender. Normal bowel sounds heard. Tube site unremarkable. Central nervous system: Alert and oriented. Facial asymmetry +. Expressive aphasia. Extremities: 5 x 5 power left extremities. 0 x 5 power right extremities.   Data Reviewed: Basic Metabolic Panel:  Recent Labs Lab 04/30/13 2005 05/01/13 1641 05/02/13 0440 05/02/13 1708 05/03/13 0210  NA 148* 151* 145 141 139  K 3.7 3.4* 2.9* 3.5 3.2*  CL 110 114* 110 106 105  CO2 29 29 29 27 27   GLUCOSE 124* 101* 131* 116* 97  BUN 27* 16 15 13 12   CREATININE 1.29 0.82 0.81 0.82 0.84  CALCIUM 9.3 9.6 9.2 9.0 9.3  MG  --   --  2.3  --   --    Liver Function Tests:  Recent Labs Lab 04/30/13 2005  AST 33  ALT 104*  ALKPHOS 54  BILITOT 0.6  PROT 7.0  ALBUMIN 2.4*   No results found for this basename: LIPASE, AMYLASE,  in the last 168 hours No results found for this basename: AMMONIA,  in the last 168 hours CBC:  Recent Labs Lab 04/30/13 2005  05/01/13 1641 05/02/13 0440 05/02/13 1035  05/02/13 1711 05/03/13 0210  WBC 15.5*  < > 12.4* 8.6 7.5 7.7 7.3  NEUTROABS 12.8*  --   --   --   --   --   --   HGB 10.3*  < > 9.2* 7.8* 7.9* 8.3* 8.2*  HCT 32.0*  < > 28.5* 24.2* 24.4* 25.7* 24.9*  MCV 99.4  < > 99.7 98.4 98.0 97.7 96.5  PLT 214  < > 230 215 226 253 253  < > = values in this interval not displayed. Cardiac Enzymes: No results found for this basename: CKTOTAL, CKMB, CKMBINDEX, TROPONINI,  in the last 168 hours BNP (last 3 results) No results found for this basename: PROBNP,  in the last 8760 hours CBG: No results found for this basename: GLUCAP,  in the last 168 hours  Recent Results (from the past 240 hour(s))  URINE CULTURE     Status: None   Collection Time    04/30/13  7:06 PM      Result Value Range Status   Specimen Description URINE, CATHETERIZED   Final   Special Requests NONE   Final   Culture  Setup Time     Final   Value: 05/01/2013  02:26     Performed at [facility] Count     Final   Value: >=100,000 COLONIES/ML     Performed at [facility]   Culture     Final   Value: Multiple bacterial morphotypes present, none predominant. Suggest appropriate recollection if clinically indicated.     Performed at [facility]   Report Status 05/03/2013 FINAL   Final  MRSA PCR SCREENING     Status: None   Collection Time    05/01/13  6:39 AM      Result Value Range Status   MRSA by PCR NEGATIVE  NEGATIVE Final   Comment:            The GeneXpert MRSA Assay (FDA     approved for NASAL specimens     only), is one component of a     comprehensive MRSA colonization     surveillance program. It is not     intended to diagnose MRSA     infection nor to guide or     monitor treatment for     MRSA infections.  CULTURE, BLOOD (ROUTINE X 2)     Status: None   Collection Time    05/01/13 12:55 PM      Result Value Range Status   Specimen Description BLOOD LEFT ARM   Final   Special Requests BOTTLES DRAWN AEROBIC AND  ANAEROBIC 1.5 CC EACH   Final   Culture  Setup Time     Final   Value: 05/01/2013 21:44     Performed at [facility]   Culture     Final   Value:        BLOOD CULTURE RECEIVED NO GROWTH TO DATE CULTURE WILL BE HELD FOR 5 DAYS BEFORE ISSUING A FINAL NEGATIVE REPORT     Performed at [facility]   Report Status PENDING   Incomplete  CULTURE, BLOOD (ROUTINE X 2)     Status: None   Collection Time    05/01/13  1:05 PM      Result Value Range Status   Specimen Description BLOOD LEFT ARM   Final   Special Requests BOTTLES DRAWN AEROBIC AND ANAEROBIC 6 CC EACH   Final   Culture  Setup Time     Final   Value: 05/01/2013 21:44     Performed at [facility]   Culture     Final   Value:        BLOOD CULTURE RECEIVED NO GROWTH TO DATE CULTURE WILL BE HELD FOR 5 DAYS BEFORE ISSUING A FINAL NEGATIVE REPORT     Performed at [facility]   Report Status PENDING   Incomplete  CLOSTRIDIUM DIFFICILE BY PCR     Status: None   Collection Time    05/01/13  9:20 PM      Result Value Range Status   C difficile by pcr NEGATIVE  NEGATIVE Final   Comment: Performed at [facility]      Additional labs: 1. INR: 6.98> 3.91>3.2      Studies: Dg Abd 1 View  05/01/2013   CLINICAL DATA:  Evaluate for gastric leak  EXAM: ABDOMEN - 1 VIEW  COMPARISON:  CT abdomen pelvis dated 05/01/2013  FINDINGS: Contrast was administered via the malpositioned gastrostomy tube (currently located within the jejunum).  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 A corresponding abnormality is present on  CT (series 7/images 10-14).  No free extraluminal contrast is noted.  IMPRESSION: Malpositioned gastrostomy tube within the jejunum.  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  No free extraluminal contrast is noted.   Electronically Signed   By: Charline Bills M.D.   On: 05/01/2013 13:37    Dg Kayleen Memos W/water Sol Cm  05/01/2013   CLINICAL DATA:  Evaluate for gastric leak  EXAM: ESOPHAGUS/BARIUM SWALLOW/TABLET STUDY  TECHNIQUE: Water-soluble contrast was injected into the existing NG tube and imaging was obtained.  COMPARISON:  None.  FLUOROSCOPY TIME:  1 min and 1 2nd.  FINDINGS: Contrast fills the stomach. There is no extravasation of contrast into the peritoneal space. Contrast does fill a fistula to the adjacent jejunum.  IMPRESSION: There is no evidence of intraperitoneal leak of contrast from the stomach. 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   Electronically Signed   By: Maryclare Bean M.D.   On: 05/01/2013 14:51        Scheduled Meds: . antiseptic oral rinse  15 mL Mouth Rinse q12n4p  . cefTRIAXone (ROCEPHIN)  IV  1 g Intravenous Q24H  . chlorhexidine  15 mL Mouth Rinse BID  . feeding supplement  30 mL Per Tube Q1500  . lip balm  1 application Topical BID  . pantoprazole (PROTONIX) IV  40 mg Intravenous Q12H  . potassium chloride  40 mEq Per Tube Once   Continuous Infusions: . dextrose 5 % with kcl 100 mL/hr at 05/02/13 2044  . feeding supplement (OSMOLITE 1.5 CAL)      Principal Problem:   Pneumoperitoneum Active Problems:   Lower GI bleed   UTI (urinary tract infection)   Supratherapeutic INR   Chronic indwelling Foley catheter   Jejunostomy tube present (?originally G tube?)   Laceration of eyebrow s/p repair 04/26/2013   Hemiparesis affecting right side as late effect of stroke   Dysarthria as late effect of stroke   DVT of right axillary vein, acute   Anemia    Time spent: 40 minutes.    [facility]  [facility] Pager [phone].   If 8PM-8AM, please contact night-coverage at [email or web address], password [facility] 05/03/2013, 12:26 PM  LOS: 3 days

## 2013-05-03 NOTE — Progress Notes (Signed)
Subjective: No acute events.  Feeling well.  No reports of any bleeding.  Objective: Vital signs in last 24 hours: Temp:  [98.1 F (36.7 C)-99.6 F (37.6 C)] 98.1 F (36.7 C) (09/23 1438) Pulse Rate:  [60-91] 91 (09/23 1438) Resp:  [16-20] 20 (09/23 1438) BP: (117-134)/(66-81) 117/74 mmHg (09/23 1438) SpO2:  [97 %-100 %] 98 % (09/23 1438) Last BM Date: 05/02/13  Intake/Output from previous day: 09/22 0701 - 09/23 0700 In: 2750 [I.V.:2300; IV Piggyback:450] Out: 1640 [Urine:1640] Intake/Output this shift: Total I/O In: 0  Out: 700 [Urine:700]  General appearance: alert and no distress GI: soft, non-tender; bowel sounds normal; no masses,  no organomegaly  Lab Results:  Recent Labs  05/02/13 1035 05/02/13 1711 05/03/13 0210  WBC 7.5 7.7 7.3  HGB 7.9* 8.3* 8.2*  HCT 24.4* 25.7* 24.9*  PLT 226 253 253   BMET  Recent Labs  05/02/13 0440 05/02/13 1708 05/03/13 0210  NA 145 141 139  K 2.9* 3.5 3.2*  CL 110 106 105  CO2 29 27 27   GLUCOSE 131* 116* 97  BUN 15 13 12   CREATININE 0.81 0.82 0.84  CALCIUM 9.2 9.0 9.3   LFT  Recent Labs  04/30/13 2005  PROT 7.0  ALBUMIN 2.4*  AST 33  ALT 104*  ALKPHOS 54  BILITOT 0.6   PT/INR  Recent Labs  05/02/13 0440 05/03/13 0210  LABPROT 36.8* 31.8*  INR 3.91* 3.23*   Hepatitis Panel No results found for this basename: HEPBSAG, HCVAB, HEPAIGM, HEPBIGM,  in the last 72 hours C-Diff No results found for this basename: CDIFFTOX,  in the last 72 hours Fecal Lactopherrin No results found for this basename: FECLLACTOFRN,  in the last 72 hours  Studies/Results: No results found.  Medications:  Scheduled: . antiseptic oral rinse  15 mL Mouth Rinse q12n4p  . cefTRIAXone (ROCEPHIN)  IV  1 g Intravenous Q24H  . chlorhexidine  15 mL Mouth Rinse BID  . feeding supplement  30 mL Per Tube Q1500  . lip balm  1 application Topical BID  . pantoprazole (PROTONIX) IV  40 mg Intravenous Q12H  . potassium chloride  40 mEq  Per Tube Once  . potassium chloride  40 mEq Per Tube Once   Continuous: . dextrose 5 % with kcl 20 mL/hr at 05/03/13 1257  . feeding supplement (OSMOLITE 1.5 CAL)      Assessment/Plan: 1) GI bleed - stable. 2) S/p PEG tube with gastrojejunal fistula.   Clinically he appears to be stable.  No reports of any bleeding at this time and his INR is in the 3 range.  He is tolerating his tube feeds without any difficulty.  Plan: 1) Continue with tube feedings. 2) If bleeding recurs, then an EGD will be required. 3) He can resume coumadin to maintain his INR once his INR drops into the therapeutic range.   LOS: 3 days   Jeanetta Alonzo D 05/03/2013, 2:59 PM

## 2013-05-03 NOTE — Progress Notes (Signed)
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 He had a PEG tube placed in the hospital due to dysphagia. Patient sent from there as he had a fever with one episode of bright red blood per rectum on the day of admission. History primarily provided by brother at bedside and from ED notes.. Mother also reports the patient was complaining of some abdominal pain for the last 1 week. Patient was noted to have a fever of 103F in the nursing home and with this into the ED.    OT comments  Treatment focused on positioning LUE in bed, chair and PROM RUE/AROM LUE. PAtient tolerated well.  Follow Up Recommendations  SNF    Frequency Min 2X/week   Progress towards OT Goals Progress towards OT goals: Progressing toward goals  Plan Discharge plan remains appropriate    Precautions / Restrictions Precautions Precautions: Fall Precaution Comments: right hemiplegia and recent PEG Restrictions Weight Bearing Restrictions: No   Pertinent Vitals/Pain     ADL  Transfers/Ambulation Related to ADLs: Bed level treatment only ADL Comments: Reviewed positioning of RUE with pt in bed and in chair. Patient needs reinforcement. PROM to RUE all joints/planes. AROM exercises x 10 reps of 3 exercises LUE.    OT Diagnosis:    OT Problem List:   OT Treatment Interventions:     OT Goals(current goals can now be found in the care plan section)    Visit Information  Last OT Received On: 05/03/13 Assistance Needed: +2 History of Present Illness: 57 year old  male with history of hypertension, history of DVT on Coumadin who was admitted to [facility] recently with an acute stroke involving a large area on the left side with residual right hemiplegia and dysphasia was discharged to skilled nursing facility. He had a PEG tube placed in the hospital due to dysphagia. Patient sent from there as he had a fever with one episode of bright red blood per rectum on the day of admission. History primarily provided by brother at bedside and from ED notes.. Mother also reports the patient was complaining of some abdominal pain for the last 1 week. Patient was noted to have a fever of 103F in the nursing home and with this into the ED.     Subjective Data      Prior Functioning       Cognition  Cognition Arousal/Alertness: Awake/alert Behavior During Therapy: WFL for tasks assessed/performed Overall Cognitive Status: Within Functional Limits for tasks assessed    Mobility  Bed Mobility Bed Mobility: Rolling Right;Right Sidelying to Sit;Sitting - Scoot to [place] of Bed Rolling Right: 1: +2 Total assist Rolling Right: Patient Percentage: 40% Right Sidelying to Sit: 1: +2 Total assist Right Sidelying to Sit: Patient Percentage: 10% Sitting - Scoot to Edge of Bed: 1: +2 Total assist Sitting - Scoot to Edge of Bed: Patient Percentage: 10% Details for Bed Mobility Assistance: 50% VC's on proper tech and use of rail with increased time. Transfers Details for Transfer Assistance: Assisted pt OOB to [facility] using "bear Hug" stand pivot tech towards pt's stronger side 1/4 incomplete turn  pt only 10%.      Exercises      Balance     End of Session OT - End of Session Activity Tolerance: Patient tolerated treatment well Patient left: in bed;with call bell/phone within reach  GO     Archer Moist A 05/03/2013, 3:55 PM

## 2013-05-03 NOTE — Clinical Social Work Psychosocial (Signed)
     Clinical Social Work Department BRIEF PSYCHOSOCIAL ASSESSMENT 05/03/2013  Patient:  [name] [name],[name] [name]     Account Number:  [id number]     Admit date:  04/30/2013  Clinical Social Worker:  Hattie Perch  Date/Time:  05/03/2013 12:00 M  Referred by:  Physician  Date Referred:  05/03/2013 Referred for  SNF Placement   Other Referral:   Interview type:  Family Other interview type:    PSYCHOSOCIAL DATA Living Status:  FACILITY Admitted from facility:  [facility] Level of care:  Skilled Nursing Facility Primary support name:  [name] [name] Primary support relationship to patient:  SIBLING Degree of support available:   good    CURRENT CONCERNS Current Concerns  Post-Acute Placement   Other Concerns:    SOCIAL WORK ASSESSMENT / PLAN CSW met with patient. patient is only partially oriented and unable to fully participate in assessment. CSW spoke with patient's brother, [name] [name], who is his POA. He confirms that patient was at [facility] following his CVA for rehab and he would like to return there upon discharge.   Assessment/plan status:   Other assessment/ plan:   Information/referral to community resources:    PATIENTS/FAMILYS RESPONSE TO PLAN OF CARE: patients brother/poa were happy with his care at [facility] and wouldlike him to return upon discharge once his medical condition clears up.

## 2013-05-04 ENCOUNTER — Encounter (HOSPITAL_COMMUNITY): Payer: Self-pay | Admitting: *Deleted

## 2013-05-04 ENCOUNTER — Inpatient Hospital Stay (HOSPITAL_COMMUNITY): Payer: BC Managed Care – PPO

## 2013-05-04 DIAGNOSIS — I82A19 Acute embolism and thrombosis of unspecified axillary vein: Secondary | ICD-10-CM

## 2013-05-04 LAB — PROTIME-INR
INR: 2.26 — ABNORMAL HIGH (ref 0.00–1.49)
Prothrombin Time: 24.2 seconds — ABNORMAL HIGH (ref 11.6–15.2)

## 2013-05-04 LAB — BASIC METABOLIC PANEL
Chloride: 105 mEq/L (ref 96–112)
Creatinine, Ser: 0.77 mg/dL (ref 0.50–1.35)
GFR calc Af Amer: >90 mL/min (ref >90)
Potassium: 3.3 mEq/L — ABNORMAL LOW (ref 3.5–5.1)
Sodium: 138 mEq/L (ref 135–145)

## 2013-05-04 LAB — MAGNESIUM: Magnesium: 2 mg/dL (ref 1.5–2.5)

## 2013-05-04 LAB — CBC
HCT: 27.7 % — ABNORMAL LOW (ref 39.0–52.0)
MCV: 95.2 fL (ref 78.0–100.0)
Platelets: 317 10*3/uL (ref 150–400)
RDW: 13 % (ref 11.5–15.5)
WBC: 6.3 10*3/uL (ref 4.0–10.5)

## 2013-05-04 LAB — GLUCOSE, CAPILLARY
Glucose-Capillary: 109 mg/dL — ABNORMAL HIGH (ref 70–99)
Glucose-Capillary: 140 mg/dL — ABNORMAL HIGH (ref 70–99)
Glucose-Capillary: 143 mg/dL — ABNORMAL HIGH (ref 70–99)
Glucose-Capillary: 148 mg/dL — ABNORMAL HIGH (ref 70–99)
Glucose-Capillary: 96 mg/dL (ref 70–99)

## 2013-05-04 MED ORDER — POTASSIUM CHLORIDE 20 MEQ/15ML (10%) PO LIQD
40.0000 meq | ORAL | Status: AC
  Administered 2013-05-04 (×2): 40 meq via ORAL
  Filled 2013-05-04 (×3): qty 30

## 2013-05-04 MED ORDER — WARFARIN - PHARMACIST DOSING INPATIENT: Freq: Every day | Status: DC

## 2013-05-04 MED ORDER — WARFARIN SODIUM 2.5 MG PO TABS
2.5000 mg | ORAL_TABLET | Freq: Once | ORAL | Status: AC
  Administered 2013-05-04: 2.5 mg
  Filled 2013-05-04: qty 1

## 2013-05-04 NOTE — Progress Notes (Signed)
ANTICOAGULATION CONSULT NOTE - Initial Consult  Pharmacy Consult for Warfarin Indication: History DVT and recent CVA  No Known Allergies  Patient Measurements: Height: 5\' 11"  (180.3 cm) Weight: 210 lb 5.1 oz (95.4 kg) IBW/kg (Calculated) : 75.3  Vital Signs: Temp: 98.4 F (36.9 C) (09/24 1352) Temp src: Oral (09/24 1352) BP: 94/61 mmHg (09/24 1352) Pulse Rate: 74 (09/24 1352)  Labs:  Recent Labs  05/02/13 0440  05/02/13 1708 05/02/13 1711 05/03/13 0210 05/04/13 0425  HGB 7.8*  < >  --  8.3* 8.2* 9.2*  HCT 24.2*  < >  --  25.7* 24.9* 27.7*  PLT 215  < >  --  253 253 317  LABPROT 36.8*  --   --   --  31.8* 24.2*  INR 3.91*  --   --   --  3.23* 2.26*  CREATININE 0.81  --  0.82  --  0.84 0.77  < > = values in this interval not displayed.  Estimated Creatinine Clearance: 118.6 ml/min (by C-G formula based on Cr of 0.77).   Medical History: Past Medical History  Diagnosis Date  . Dysphagia   . DVT (deep venous thrombosis)   . Hypertension   . Depression   . 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 antiseptic oral rinse  15 mL Mouth Rinse q12n4p  . cefTRIAXone (ROCEPHIN)  IV  1 g Intravenous Q24H  . chlorhexidine  15 mL Mouth Rinse BID  . feeding supplement  30 mL Per Tube Q1500  . lip balm  1 application Topical BID  . pantoprazole (PROTONIX) IV  40 mg Intravenous Q12H  . potassium chloride  40 mEq Per Tube Once   Infusions:  . dextrose 5 % with kcl 20 mL/hr at 05/04/13 0630  . feeding supplement (OSMOLITE 1.5 CAL) 1,000 mL (05/04/13 0957)    Assessment: 57 yo male admitted 9/21 with rectal bleeding and supratherpeutic INR s/p PEG tube placement ~2 weeks ago at [facility]. CT shows extensive pneumoperitoneaum, believed to be a sequale of PEG placement. Given FFP to reverse INR on admit and warfarin  has been on hold. INR has now trended down into therapeutic range, therefore, ok to resume warfarin per GI and [facility]s.  On chronic warfarin for hx DVT and CVA. Dose PTA reported as 5mg  via PEG tube daily - most recent dose 9/19  TF resumed 9/23, barium swallow eval shows continued aspiration  CBC improved, no further bleeding reported  Goal of Therapy:  INR 2-3   Plan:   Resume low dose warfarin 2.5mg  via PEG today  Daily PT/INR  Loralee Pacas, PharmD, BCPS Pager: [phone] 05/04/2013,1:59 PM

## 2013-05-04 NOTE — Progress Notes (Signed)
[facility] PROGRESS NOTE  [name] [name] WUJ:[id number] DOB: 27-Dec-1955 DOA: 04/30/2013 PCP: No primary provider on file.  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 He was transferred to the ED on 05/01/13 due to fever (103F) and one episode of bright red bleeding per act him on day of admission. In the ED, hemoglobin 10.3 (at discharge from [facility] was 11), WBC 15.5, Na 148, INR 5.49, chest x-ray: Extensive pneumoperitoneum, CT abdomen and pelvis with contrast: Extensive pneumoperitoneum, enlarged prostate and mild bilateral hydronephrosis. General surgery and GI were consulted. Her pneumoperitoneum is believed to be secondary to PEG tube placement and has been cleared for feeding by surgery. Coagulopathy was partially reversed with FFP x3. Hemoglobin initially dropped by about 2.5 g but seems to have stabilized and no further BMs since 9/22 AM. Starting tube feeds on 9/23 and requesting swallow eval.   Assessment/Plan:  Rectal bleeding in context of supra therapeutic INR - In the setting of supratherapeutic INR - GI consultation & followup appreciated. GI recommends resuming tube feeds. Discussed with Dr. Jeani Hawking on 9/23 who advised starting tube feeds, no EGD at this time unless recurrent bleeding, and recommends resuming Coumadin as INR dictates. - Continue IV PPI, IV fluids. - Follow serial H&H's and transfuse as indicated. Hb has dropped by ~ 2.5gm since admission but has stabilized. -  Status post 3 units FFP since admission. INR 3.23. Coagulopathy probably made worse secondary to n.p.o. status. - Patient had multiple loose dark stools on 9/21. No BM since 9/22 AM. FOBT pending - Hemodynamically stable -Will  restart coumadin today per pharmacy protocol.  UTI - Cx with mult morphotypes. -Will DC rocephin.  Anemia- ABLA on chronic anemia - Hemoglobin drop of about 2.5 g since admission - Follow H&H closely and transfuse if Hb drops any further. - Stable hemoglobin over the last 24 hours.  Hypokalemia - K is 3.3 today. -Continue to replete via PEG tube.  Pneumoperitoneum - Currently asymptomatic. No evidence of shock or peritonitis. - Could be secondary to recent tube placement (POD # 21). - Continue conservative management. - Surgery and GI consultation appreciated. CCS/GI cleared for tube feed.  Leukocytosis - Secondary to UTI and stress response. - resolved  Prostatomegaly/urinary retention/mild bilateral hydronephrosis - Continue Foley catheter for now and may consider discharging on same until outpatient urology consultation. - Outpatient workup i.e. PSA etc. Including urology consultation  Recent large left CVA/right hemiplegia/dysphagia/expressive aphasia/right axillary vein DVT per GI note - Holding medications secondary to GI bleed. - Consider resuming Coumadin when INR comes down to 2.5 range. - Unclear if on Coumadin for DVT alone (if then? Duration) or for CVA too. This needs to be clarified. - Failed MBS. -Continue NPO with tube feeds for now.  Hypertension - Stable.  DVT/coagulopathy secondary to Coumadin - Management as above.  DVT prophylaxis: SCDs. Currently coagulopathic. Lines/catheters: PIV.  Nutrition: Resume tube feeds 9/23 Activity:  Up with assistance Code Status: Full Family Communication: Patient only Disposition Plan: Return to SNF when medically stable. Likely in 24 hours.   Consultants:  General surgery  Gastroenterology  Procedures:  Foley catheter  Has PEG tube from prior to admission.  Antibiotics:  IV Rocephin 9/21 > 9/24  Subjective: Denies complaints. Per nursing, no BM since yesterday morning.  Wants to  eat.  Objective: Filed Vitals:   05/04/13 0505 05/04/13 0820 05/04/13 0952 05/04/13 1352  BP: 104/74 110/72 99/69 94/61   Pulse: 85 78 88 74  Temp: 98.3 F (36.8 C) 97.5 F (36.4 C) 98.6 F (37 C) 98.4 F (36.9 C)  TempSrc: Oral Oral Oral Oral  Resp: 20  25 24   Height:      Weight: 95.4 kg (210 lb 5.1 oz)     SpO2: 97% 98% 98% 100%    Intake/Output Summary (Last 24 hours) at 05/04/13 1411 Last data filed at 05/04/13 1300  Gross per 24 hour  Intake 2004.33 ml  Output   1300 ml  Net 704.33 ml   Filed Weights   05/01/13 0415 05/04/13 0505  Weight: 83.5 kg (184 lb 1.4 oz) 95.4 kg (210 lb 5.1 oz)     Exam:  General exam: Moderately built and nourished male patient lying comfortably supine in bed. Respiratory system: Clear. No increased work of breathing. Cardiovascular system: S1 & S2 heard, RRR. No JVD, murmurs, gallops, clicks or pedal edema. Telemetry: Sinus rhythm Gastrointestinal system: Abdomen is nondistended, soft and nontender. Normal bowel sounds heard. Tube site unremarkable. Central nervous system: Alert and oriented. Facial asymmetry +. Expressive aphasia. Extremities: 5 x 5 power left extremities. 0 x 5 power right extremities.   Data Reviewed: Basic Metabolic Panel:  Recent Labs Lab 05/01/13 1641 05/02/13 0440 05/02/13 1708 05/03/13 0210 05/04/13 0425  NA 151* 145 141 139 138  K 3.4* 2.9* 3.5 3.2* 3.3*  CL 114* 110 106 105 105  CO2 29 29 27 27 24   GLUCOSE 101* 131* 116* 97 152*  BUN 16 15 13 12 13   CREATININE 0.82 0.81 0.82 0.84 0.77  CALCIUM 9.6 9.2 9.0 9.3 8.9  MG  --  2.3  --   --  2.0   Liver Function Tests:  Recent Labs Lab 04/30/13 2005  AST 33  ALT 104*  ALKPHOS 54  BILITOT 0.6  PROT 7.0  ALBUMIN 2.4*   No results found for this basename: LIPASE, AMYLASE,  in the last 168 hours No results found for this basename: AMMONIA,  in the last 168 hours CBC:  Recent Labs Lab 04/30/13 2005  05/02/13 0440 05/02/13 1035  05/02/13 1711 05/03/13 0210 05/04/13 0425  WBC 15.5*  < > 8.6 7.5 7.7 7.3 6.3  NEUTROABS 12.8*  --   --   --   --   --   --   HGB 10.3*  < > 7.8* 7.9* 8.3* 8.2* 9.2*  HCT 32.0*  < > 24.2* 24.4* 25.7* 24.9* 27.7*  MCV 99.4  < > 98.4 98.0 97.7 96.5 95.2  PLT 214  < > 215 226 253 253 317  < > = values in this interval not displayed. Cardiac Enzymes: No results found for this basename: CKTOTAL, CKMB, CKMBINDEX, TROPONINI,  in the last 168 hours BNP (last 3 results) No results found for this basename: PROBNP,  in the last 8760 hours CBG:  Recent Labs Lab 05/03/13 2014 05/04/13 0008 05/04/13 0427 05/04/13 0808 05/04/13 1243  GLUCAP 125* 148* 157* 140* 96    Recent Results (from the past 240 hour(s))  URINE CULTURE     Status: None   Collection Time    04/30/13  7:06 PM      Result Value Range Status   Specimen Description URINE, CATHETERIZED   Final   Special Requests NONE   Final   Culture  Setup Time  Final   Value: 05/01/2013 02:26     Performed at [facility] Count     Final   Value: >=100,000 COLONIES/ML     Performed at [facility]   Culture     Final   Value: Multiple bacterial morphotypes present, none predominant. Suggest appropriate recollection if clinically indicated.     Performed at [facility]   Report Status 05/03/2013 FINAL   Final  MRSA PCR SCREENING     Status: None   Collection Time    05/01/13  6:39 AM      Result Value Range Status   MRSA by PCR NEGATIVE  NEGATIVE Final   Comment:            The GeneXpert MRSA Assay (FDA     approved for NASAL specimens     only), is one component of a     comprehensive MRSA colonization     surveillance program. It is not     intended to diagnose MRSA     infection nor to guide or     monitor treatment for     MRSA infections.  CULTURE, BLOOD (ROUTINE X 2)     Status: None   Collection Time    05/01/13 12:55 PM      Result Value Range Status   Specimen Description  BLOOD LEFT ARM   Final   Special Requests BOTTLES DRAWN AEROBIC AND ANAEROBIC 1.5 CC EACH   Final   Culture  Setup Time     Final   Value: 05/01/2013 21:44     Performed at [facility]   Culture     Final   Value:        BLOOD CULTURE RECEIVED NO GROWTH TO DATE CULTURE WILL BE HELD FOR 5 DAYS BEFORE ISSUING A FINAL NEGATIVE REPORT     Performed at [facility]   Report Status PENDING   Incomplete  CULTURE, BLOOD (ROUTINE X 2)     Status: None   Collection Time    05/01/13  1:05 PM      Result Value Range Status   Specimen Description BLOOD LEFT ARM   Final   Special Requests BOTTLES DRAWN AEROBIC AND ANAEROBIC 6 CC EACH   Final   Culture  Setup Time     Final   Value: 05/01/2013 21:44     Performed at [facility]   Culture     Final   Value:        BLOOD CULTURE RECEIVED NO GROWTH TO DATE CULTURE WILL BE HELD FOR 5 DAYS BEFORE ISSUING A FINAL NEGATIVE REPORT     Performed at [facility]   Report Status PENDING   Incomplete  CLOSTRIDIUM DIFFICILE BY PCR     Status: None   Collection Time    05/01/13  9:20 PM      Result Value Range Status   C difficile by pcr NEGATIVE  NEGATIVE Final   Comment: Performed at [facility]      Additional labs: 1. INR: 6.98> 3.91>3.2      Studies: Dg Swallowing Func-speech Pathology  05/04/2013   Chales Abrahams, CCC-SLP     05/04/2013 11:18 AM Objective Swallowing Evaluation: Modified Barium Swallowing Study   Patient Details  Name: [name] [name] MRN: [id number] Date of Birth: 18-Nov-1955  Today's Date: 05/04/2013 Time: 0900-0924 SLP Time Calculation (min): 24 min  Past Medical History:  Past Medical  History  Diagnosis Date  . Dysphagia   . DVT (deep venous thrombosis)   . Hypertension   . Depression   . 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   He was lethargic during large portion of hospital stay at [facility]  per chart review and PEG was placed 04/18/13.  Pulmonary issues  resolved and pt has been working with SNF SLP.  Order for swallow  evaluation received and MBS requested d/t h/o CVA, severe  dysphagia.       Assessment / Plan / Recommendation Clinical Impression  Dysphagia Diagnosis: Moderate oral phase dysphagia;Moderate  pharyngeal phase dysphagia Clinical impression: Pt presents with moderate sensorimotor  oropharyngeal dysphagia with decreased oral bolus cohesion  resulting in premature spillage of barium into pharynx. Also oral  residuals to which pt does not have awareness prematurely spilled  into pharynx without sensation - requiring cue to swallow.  Pharyngeal swallow is strong albeit delayed with boluses filling  pyriform sinus. Trace (SILENT) aspiration of nectar and thin  (below cords) was cleared with cued cough. Chin tuck posture did  not prevent penetration.  Due to sensorimotor impairments and weak cough, rec continue NPO  except ice chips and continue PEG tube feeding for nutritional  support.   PO with SLP only of soft/thin - with follow up at [facility]  for continued dysphagia treatment.  During po with SLP, pt to  swallow twice with every sip and intermittently cough/throat  clear.    Pt with oral cavity full of secretions without awareness  indicating sensory deficits, cue to swallow effective to clear.    Brother reports pt will cough at times, suspect gross aspiration  of secretions with  sensation.  Instructed pt and brother to  encourage pt to swallow his secretions.    Pt and brother educated to results of testing and recommendations  - both agreeable to plan.      Treatment Recommendation  Therapy as outlined in treatment plan below    Diet Recommendation NPO;Ice chips PRN after oral care (po with  slp only and follow up at SNF)   Medication Administration: Via alternative means    Other  Recommendations   N/A  Follow Up Recommendations    SNF   Frequency and Duration min 2x/week  2 weeks       SLP Swallow Goals Goal #3: Pt will swallow trials of soft/thin with SLP conducting  compensation strategies without s/s of aspiration with min cues  with goal to aid transition into nutrition by mouth.     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   He was lethargic during large portion of hospital stay at [facility]  per chart review and PEG was placed 04/18/13.  Pulmonary issues  resolved and pt has been working with SNF SLP.  Order for swallow  evaluation received and MBS requested d/t h/o CVA, severe  dysphagia.   Type of Study: Modified Barium Swallowing Study Reason for Referral: Objectively evaluate swallowing function Diet Prior to this Study: NPO (except ice that pt has been  managing) Temperature Spikes Noted: No Respiratory Status: Room air Behavior/Cognition: Alert;Cooperative;Pleasant mood Oral Motor / Sensory Function: Impaired motor;Impaired sensory Oral impairment: Right facial;Right lingual;Right labial  (severely dysarthric) Self-Feeding Abilities: Able to feed self;Needs assist Patient Positioning: Upright in chair Baseline Vocal Quality: Low vocal intensity Volitional Cough: Weak Volitional  Swallow: Able to elicit Anatomy: Within functional limits Pharyngeal Secretions: Standing secretions in (comment) (oral  cavity, cued swallow aided clearance)    Reason for Referral Objectively evaluate swallowing function   Oral Phase Oral Preparation/Oral Phase Oral Phase: Impaired Oral - Honey Oral - Honey Cup: Weak lingual manipulation;Reduced posterior  propulsion Oral - Nectar Oral - Nectar Teaspoon: Weak lingual manipulation;Lingual/palatal  residue;Reduced posterior propulsion Oral - Nectar Cup: Weak lingual manipulation;Lingual/palatal  residue;Reduced posterior propulsion Oral - Nectar Straw: Weak lingual manipulation;Reduced posterior  propulsion;Lingual/palatal residue Oral - Thin Oral - Thin Teaspoon: Weak lingual manipulation;Reduced posterior  propulsion Oral - Thin Cup: Weak lingual manipulation;Lingual/palatal  residue;Reduced posterior propulsion Oral - Thin Straw: Not tested Oral - Solids Oral - Puree: Weak lingual manipulation;Reduced posterior  propulsion Oral - Regular: Weak lingual manipulation;Reduced posterior  propulsion Oral Phase - Comment Oral Phase - Comment: verbal cue to increase speed of swallow not  effective   Pharyngeal Phase Pharyngeal Phase Pharyngeal Phase: Impaired Pharyngeal - Honey Pharyngeal - Honey Cup: Premature spillage to pyriform sinuses Pharyngeal - Nectar Pharyngeal - Nectar Teaspoon: Premature spillage to pyriform  sinuses Pharyngeal - Nectar Cup: Premature spillage to pyriform  sinuses;Penetration/Aspiration during swallow Penetration/Aspiration details (nectar cup): Material enters  airway, remains ABOVE vocal cords and not ejected out Pharyngeal - Nectar Straw: Premature spillage to pyriform  sinuses;Trace aspiration;Penetration/Aspiration during swallow  (chin tuck not helpful, cued cough removed trace aspirates) Penetration/Aspiration details (nectar straw): Material enters  airway, passes BELOW cords without attempt by patient to eject  out (silent aspiration)  Pharyngeal - Thin Pharyngeal - Thin Teaspoon: Premature spillage to pyriform  sinuses Pharyngeal - Thin Cup: Premature spillage to pyriform  sinuses;Trace aspiration;Penetration/Aspiration during swallow Penetration/Aspiration details (thin cup): Material enters  airway, passes BELOW cords without attempt by patient to eject  out (silent aspiration) Pharyngeal - Solids Pharyngeal - Puree: Premature spillage to pyriform sinuses Pharyngeal - Regular: Premature spillage to pyriform sinuses Pharyngeal Phase - Comment Pharyngeal Comment: pharyngeal swallow was strong albeit delayed,  oral residue of nectar prematurely spilled into pharynx without  pt sensation - he required verbal cue to swallow   Cervical Esophageal Phase    GO    Cervical Esophageal Phase Cervical Esophageal Phase: Burlene Arnt, MS [facility] SLP [phone]         Scheduled Meds: . antiseptic oral rinse  15 mL Mouth Rinse q12n4p  . chlorhexidine  15 mL Mouth Rinse BID  . feeding supplement  30 mL Per Tube Q1500  . lip balm  1 application Topical BID  . pantoprazole (PROTONIX) IV  40 mg Intravenous Q12H  .  potassium chloride  40 mEq Per Tube Once  . potassium chloride  40 mEq Oral Q4H  . warfarin  2.5 mg Per Tube ONCE-1800  . Warfarin - Pharmacist Dosing Inpatient   Does not apply q1800   Continuous Infusions: . dextrose 5 % with kcl 20 mL/hr at 05/04/13 0630  . feeding supplement (OSMOLITE 1.5 CAL) 1,000 mL (05/04/13 0957)    Principal Problem:   Pneumoperitoneum Active Problems:   Lower GI bleed   UTI (urinary tract infection)   Supratherapeutic INR   Chronic indwelling Foley catheter   Jejunostomy tube present (?originally G tube?)   Laceration of eyebrow s/p repair 04/26/2013   Hemiparesis affecting right side as late effect of stroke   Dysarthria as late effect of stroke   DVT of right axillary vein, acute   Anemia    Time spent: 40 minutes.    [facility]  [facility] Pager  [phone].   If 8PM-8AM, please contact night-coverage at [email or web address], password [facility] 05/04/2013, 2:11 PM  LOS: 4 days

## 2013-05-04 NOTE — Progress Notes (Signed)
MBS completed, Full report to follow.  Pt with oral cavity full of secretions without awareness indicating sensory deficits, cue to swallow effective to clear.    Pt presents with moderate sensorimotor oropharyngeal dysphagia with decreased oral bolus cohesion resulting in premature spillage of barium into pharynx.  Also oral residuals to which pt does not have awareness prematurely spilled into pharynx without sensation - requiring cue to swallow.  Pharyngeal swallow is strong albeit delayed with boluses filling pyriform sinus.  Trace (SILENT) aspiration of nectar and thin (below cords) was cleared with cued cough.  Chin tuck posture did not prevent penetration.   Rec continue NPO except ice chips and continue PEG tube feeding for nutritional support PO with SLP only of soft/thin - with follow up at [facility] for continued dysphagia treatment.   Donavan Burnet, MS [facility] SLP [phone]

## 2013-05-04 NOTE — Procedures (Signed)
Objective Swallowing Evaluation: Modified Barium Swallowing Study  Patient Details  Name: [name] [name] MRN: [id number] Date of Birth: 1955-10-31  Today's Date: 05/04/2013 Time: 0900-0924 SLP Time Calculation (min): 24 min  Past Medical History:  Past Medical History  Diagnosis Date  . Dysphagia   . DVT (deep venous thrombosis)   . Hypertension   . Depression   . 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  He was lethargic during large portion of hospital stay at [facility] per chart review and PEG was placed 04/18/13.  Pulmonary issues resolved and pt has been working with SNF SLP.  Order for swallow evaluation received and MBS requested d/t h/o CVA, severe dysphagia.       Assessment / Plan / Recommendation Clinical Impression  Dysphagia Diagnosis: Moderate oral phase dysphagia;Moderate pharyngeal phase dysphagia Clinical impression: Pt presents with moderate sensorimotor oropharyngeal dysphagia with decreased oral bolus cohesion resulting in premature spillage of barium into pharynx. Also oral residuals to which pt does not have awareness prematurely spilled into pharynx without sensation - requiring cue to swallow. Pharyngeal swallow is strong albeit delayed with boluses filling pyriform sinus. Trace (SILENT) aspiration of nectar and thin (below cords) was cleared with cued cough. Chin tuck  posture did not prevent penetration.  Due to sensorimotor impairments and weak cough, rec continue NPO except ice chips and continue PEG tube feeding for nutritional support.   PO with SLP only of soft/thin - with follow up at [facility] for continued dysphagia treatment.  During po with SLP, pt to swallow twice with every sip and intermittently cough/throat clear.    Pt with oral cavity full of secretions without awareness indicating sensory deficits, cue to swallow effective to clear.   Brother reports pt will cough at times, suspect gross aspiration of secretions with sensation.  Instructed pt and brother to encourage pt to swallow his secretions.    Pt and brother educated to results of testing and recommendations - both agreeable to plan.      Treatment Recommendation  Therapy as outlined in treatment plan below    Diet Recommendation NPO;Ice chips PRN after oral care (po with slp only and follow up at SNF)   Medication Administration: Via alternative means    Other  Recommendations   N/A  Follow Up Recommendations    SNF   Frequency and Duration min 2x/week  2 weeks       SLP Swallow Goals Goal #3: Pt will swallow trials of soft/thin with SLP conducting compensation strategies without s/s of aspiration with min cues with goal to aid transition into nutrition by mouth.     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  He was lethargic during large portion of hospital stay at [facility] per chart review and PEG was placed 04/18/13.  Pulmonary issues resolved and pt has been working with SNF SLP.  Order for swallow evaluation received and MBS requested d/t h/o CVA, severe dysphagia.   Type of Study:  Modified Barium Swallowing Study Reason for Referral: Objectively evaluate swallowing function Diet Prior to this Study: NPO (except ice that pt has been managing) Temperature Spikes Noted: No Respiratory Status: Room air Behavior/Cognition: Alert;Cooperative;Pleasant mood Oral Motor / Sensory Function: Impaired motor;Impaired sensory Oral impairment: Right facial;Right lingual;Right labial (severely dysarthric) Self-Feeding Abilities: Able to feed self;Needs assist Patient Positioning: Upright in chair Baseline Vocal Quality: Low vocal intensity Volitional Cough: Weak Volitional Swallow: Able to elicit Anatomy: Within functional limits Pharyngeal Secretions: Standing secretions in (comment) (oral cavity, cued swallow aided clearance)    Reason for Referral Objectively evaluate swallowing function   Oral Phase Oral Preparation/Oral Phase Oral Phase: Impaired Oral - Honey Oral - Honey Cup: Weak lingual manipulation;Reduced posterior propulsion Oral - Nectar Oral - Nectar Teaspoon: Weak lingual manipulation;Lingual/palatal residue;Reduced posterior propulsion Oral - Nectar Cup: Weak lingual manipulation;Lingual/palatal residue;Reduced posterior propulsion Oral - Nectar Straw: Weak lingual manipulation;Reduced posterior propulsion;Lingual/palatal residue Oral - Thin Oral - Thin Teaspoon: Weak lingual manipulation;Reduced posterior propulsion Oral - Thin Cup: Weak lingual manipulation;Lingual/palatal residue;Reduced posterior propulsion Oral - Thin Straw: Not tested Oral - Solids Oral - Puree: Weak lingual manipulation;Reduced posterior propulsion Oral - Regular: Weak lingual manipulation;Reduced posterior propulsion Oral Phase - Comment Oral Phase - Comment: verbal cue to increase speed of swallow not effective   Pharyngeal Phase Pharyngeal Phase Pharyngeal Phase: Impaired Pharyngeal - Honey Pharyngeal - Honey Cup: Premature spillage to pyriform sinuses Pharyngeal -  Nectar Pharyngeal - Nectar Teaspoon: Premature spillage to pyriform sinuses Pharyngeal - Nectar Cup: Premature spillage to pyriform sinuses;Penetration/Aspiration during swallow Penetration/Aspiration details (nectar cup): Material enters airway, remains ABOVE vocal cords and not ejected out Pharyngeal - Nectar Straw: Premature spillage to pyriform sinuses;Trace aspiration;Penetration/Aspiration during swallow (chin tuck not helpful, cued cough removed trace aspirates) Penetration/Aspiration details (nectar straw): Material enters airway, passes BELOW cords without attempt by patient to eject out (silent aspiration) Pharyngeal - Thin Pharyngeal - Thin Teaspoon: Premature spillage to pyriform sinuses Pharyngeal - Thin Cup: Premature spillage to pyriform sinuses;Trace aspiration;Penetration/Aspiration during swallow Penetration/Aspiration details (thin cup): Material enters airway, passes BELOW cords without attempt by patient to eject out (silent aspiration) Pharyngeal - Solids Pharyngeal - Puree: Premature spillage to pyriform sinuses Pharyngeal - Regular: Premature spillage to pyriform sinuses Pharyngeal Phase - Comment Pharyngeal Comment: pharyngeal swallow was strong albeit delayed, oral residue of nectar prematurely spilled into pharynx without pt sensation - he required verbal cue to swallow   Cervical Esophageal Phase    GO    Cervical Esophageal Phase Cervical Esophageal Phase: [facility]         Donavan Burnet, MS [facility] SLP [phone]

## 2013-05-04 NOTE — Care Management Note (Addendum)
    Page 1 of 1   05/05/2013     11:05:19 AM   CARE MANAGEMENT NOTE 05/05/2013  Patient:  [name],[name]   Account Number:  [id number]  Date Initiated:  05/01/2013  Documentation initiated by:  [facility]  Subjective/Objective Assessment:   57 year old male with hx recent CVA admitted for SNF wit fever.     Action/Plan:   From SNF-[facility].   Anticipated DC Date:  05/05/2013   Anticipated DC Plan:  SKILLED NURSING FACILITY  In-house referral  Clinical Social Worker      DC Planning Services  CM consult      Choice offered to / List presented to:             Status of service:  Completed, signed off Medicare Important Message given?  NA - LOS <3 / Initial given by admissions (If response is "NO", the following Medicare IM given date fields will be blank) Date Medicare IM given:   Date Additional Medicare IM given:    Discharge Disposition:  SKILLED NURSING FACILITY  Per UR Regulation:  Reviewed for med. necessity/level of care/duration of stay  If discussed at Long Length of Stay Meetings, dates discussed:    Comments:  05/05/13 [name] Dondero RN,BSN NCM [phone] D/C SNF.  05/04/13 [name] Slabach RN,BSN NCM [phone] GIB,FISTULA,UTI,DYSPHAGIA,[email or web address] ,IV ABX,IV PROTONIX,TF.PT-SNF.

## 2013-05-04 NOTE — Progress Notes (Signed)
Physical Therapy Treatment Patient Details Name: Landyn Buckalew MRN: [id number] DOB: 09/21/1955 Today's Date: 05/04/2013 Time: 6213-0865 PT Time Calculation (min): 32 min  PT Assessment / Plan / Recommendation  History of Present Illness     PT Comments   Pt cooperative and motivated  To work with PT today; He is with c/o of double vision and states this is new or maybe since injury to  Right eye orbit(fall), he did not have it after recent CVA  Follow Up Recommendations  SNF     Does the patient have the potential to tolerate intense rehabilitation     Barriers to Discharge        Equipment Recommendations  None recommended by PT    Recommendations for Other Services    Frequency Min 4X/week   Progress towards PT Goals Progress towards PT goals: Progressing toward goals  Plan Current plan remains appropriate    Precautions / Restrictions Precautions Precautions: Fall Precaution Comments: dense right hemiplegia Restrictions Weight Bearing Restrictions: No   Pertinent Vitals/Pain Denies pain    Mobility  Bed Mobility Bed Mobility: Rolling Right;Right Sidelying to Sit;Sitting - Scoot to [place] of Bed Rolling Right: 3: Mod assist;With rail Right Sidelying to Sit: 1: +2 Total assist Right Sidelying to Sit: Patient Percentage: 40% Supine to Sit: With rails;HOB flat Sitting - Scoot to Edge of Bed: 1: +2 Total assist Sitting - Scoot to Edge of Bed: Patient Percentage: 60% Details for Bed Mobility Assistance: +2 for UB, RLE, lines and safety Transfers Transfers: Sit to Stand;Stand to Sit;Stand Pivot Transfers Sit to Stand: 1: +2 Total assist;From bed;From elevated surface Sit to Stand: Patient Percentage: 30% Stand to Sit: To bed;To elevated surface;1: +2 Total assist Stand to Sit: Patient Percentage: 30% Stand Pivot Transfers: 1: +2 Total assist Stand Pivot Transfers: Patient Percentage: 20% Details for Transfer Assistance: sit to stand x 2 from EOB witih multi-modlal cues  for wt shift, hand placement, safety; stand pivot to pt left side (strong side) with +2 for wt shift, lines, safety Ambulation/Gait Ambulation/Gait Assistance: Not tested (comment)    Exercises     PT Diagnosis:    PT Problem List:   PT Treatment Interventions:     PT Goals (current goals can now be found in the care plan section) Acute Rehab PT Goals PT Goal Formulation: With patient Time For Goal Achievement: 05/16/13 Potential to Achieve Goals: Fair  Visit Information  Last PT Received On: 05/04/13 Assistance Needed: +2    Subjective Data  Subjective: Ok, I will try   Cognition  Cognition Arousal/Alertness: Awake/alert Behavior During Therapy: WFL for tasks assessed/performed Overall Cognitive Status: Within Functional Limits for tasks assessed    Balance  Static Sitting Balance Static Sitting - Balance Support: Feet supported;No upper extremity supported;Left upper extremity supported Static Sitting - Level of Assistance: 4: Min assist;3: Mod assist Static Sitting - Comment/# of Minutes: variable levels of assist but improved midline and trunk control from last session Static Standing Balance Static Standing - Balance Support: Bilateral upper extremity supported Static Standing - Level of Assistance: 1: +2 Total assist Static Standing - Comment/# of Minutes: stood at [employer] walker to allow WBing RUE and facilitation R LE WBing/knee control; pt stood times 3.5 min with multi-modal cues for  wt distaribution, trunk extension  End of Session PT - End of Session Equipment Utilized During Treatment: Gait belt Activity Tolerance: Patient tolerated treatment well Patient left: in chair;with call bell/phone within reach;with nursing/sitter in room;with family/visitor present  Nurse Communication: Need for lift equipment   GP     [facility] 05/04/2013, 2:08 PM

## 2013-05-04 NOTE — Progress Notes (Signed)
  Subjective: TF restarted held for BA swallow which he showed continued aspiration problems.  Objective: Vital signs in last 24 hours: Temp:  [97.5 F (36.4 C)-98.9 F (37.2 C)] 97.5 F (36.4 C) (09/24 0820) Pulse Rate:  [60-91] 78 (09/24 0820) Resp:  [18-20] 20 (09/24 0505) BP: (104-134)/(72-80) 110/72 mmHg (09/24 0820) SpO2:  [97 %-100 %] 97 % (09/24 0505) Weight:  [95.4 kg (210 lb 5.1 oz)] 95.4 kg (210 lb 5.1 oz) (09/24 0505) Last BM Date: 05/03/13 900 per feeding tube + BM Afebrile, VSS K+ 3.3 H/H is stable  INR 2.26 Intake/Output from previous day: 09/23 0701 - 09/24 0700 In: 2004.3 [I.V.:1046; NG/GT:908.3; IV Piggyback:50] Out: 1625 [Urine:1625] Intake/Output this shift:    General appearance: alert, cooperative and no distress GI: soft, non-tender; bowel sounds normal; no masses,  no organomegaly  Lab Results:   Recent Labs  05/03/13 0210 05/04/13 0425  WBC 7.3 6.3  HGB 8.2* 9.2*  HCT 24.9* 27.7*  PLT 253 317    BMET  Recent Labs  05/03/13 0210 05/04/13 0425  NA 139 138  K 3.2* 3.3*  CL 105 105  CO2 27 24  GLUCOSE 97 152*  BUN 12 13  CREATININE 0.84 0.77  CALCIUM 9.3 8.9   PT/INR  Recent Labs  05/03/13 0210 05/04/13 0425  LABPROT 31.8* 24.2*  INR 3.23* 2.26*     Recent Labs Lab 04/30/13 2005  AST 33  ALT 104*  ALKPHOS 54  BILITOT 0.6  PROT 7.0  ALBUMIN 2.4*     Lipase  No results found for this basename: lipase     Studies/Results: No results found.  Medications: . antiseptic oral rinse  15 mL Mouth Rinse q12n4p  . cefTRIAXone (ROCEPHIN)  IV  1 g Intravenous Q24H  . chlorhexidine  15 mL Mouth Rinse BID  . feeding supplement  30 mL Per Tube Q1500  . lip balm  1 application Topical BID  . pantoprazole (PROTONIX) IV  40 mg Intravenous Q12H  . potassium chloride  40 mEq Per Tube Once    Assessment/Plan Pneumoperitoneum; s/p CVA with PEG placement at [facility] 2 weeks ago.  UGI shows: The tip of the feeding tube  rests within the jejunum with a gastrojejunal fistula. It is all contained. No leak. No obstruction.  s/p PEG tube with gastrojejunal fistula.  GI bleed with BRB per rectum  On chronic coumadin for DVT supratherapeutic INR  Fever with UTI  Hypertension  Aphasia/dysphagia   PLan:   Tube feedings and see how he does.  He clearly needs tube feeds.  LOS: 4 days    [name] [name] 05/04/2013

## 2013-05-05 LAB — CBC
HCT: 25.7 % — ABNORMAL LOW (ref 39.0–52.0)
MCH: 31.3 pg (ref 26.0–34.0)
MCHC: 32.7 g/dL (ref 30.0–36.0)
RBC: 2.68 MIL/uL — ABNORMAL LOW (ref 4.22–5.81)
RDW: 13.2 % (ref 11.5–15.5)

## 2013-05-05 LAB — GLUCOSE, CAPILLARY
Glucose-Capillary: 121 mg/dL — ABNORMAL HIGH (ref 70–99)
Glucose-Capillary: 125 mg/dL — ABNORMAL HIGH (ref 70–99)
Glucose-Capillary: 131 mg/dL — ABNORMAL HIGH (ref 70–99)

## 2013-05-05 LAB — BASIC METABOLIC PANEL
BUN: 11 mg/dL (ref 6–23)
Creatinine, Ser: 0.72 mg/dL (ref 0.50–1.35)
GFR calc Af Amer: >90 mL/min (ref >90)
GFR calc non Af Amer: >90 mL/min (ref >90)
Potassium: 3.6 mEq/L (ref 3.5–5.1)

## 2013-05-05 LAB — PROTIME-INR
INR: 1.7 — ABNORMAL HIGH (ref 0.00–1.49)
Prothrombin Time: 19.5 seconds — ABNORMAL HIGH (ref 11.6–15.2)

## 2013-05-05 MED ORDER — WARFARIN SODIUM 5 MG PO TABS
5.0000 mg | ORAL_TABLET | Freq: Once | ORAL | Status: AC
  Administered 2013-05-05: 5 mg
  Filled 2013-05-05: qty 1

## 2013-05-05 MED ORDER — OSMOLITE 1.5 CAL PO LIQD: 1000.0000 mL | ORAL | Status: DC

## 2013-05-05 NOTE — Progress Notes (Signed)
Speech Language Pathology Dysphagia Treatment Patient Details Name: [name] [name] MRN: [id number] DOB: 08-14-1955 Today's Date: 05/05/2013 Time: 9528-4132 SLP Time Calculation (min): 50 min  Assessment / Plan / Recommendation Clinical Impression  Pt. took small bites of applesauce and ice chips, requiring moderate verbal and tactile cues to follow compensatory strategies (swallow twice after each bite/sip, and cough intermittently).  Swallow initiation appears delayed and laryngeal elevation was slow/sluggish, with decreased elevation palpated.  Pt. is unable to obtain an adequate cough on command (extemely weak and likely ineffective).  After SLP left the room, pt. did have a loud spontaneous cough, likely due to aspiration from residue of food previously given.  It does not appear that pt. is tolerating trials well.  Recommend primary SLP at SNF proceed with care and possibly reconsider is risks are worth potential benefits at this time.  Will d/c po trials with SLP at this time and defer f/u swallowing treatment to SNF SLP.  Educated pt., who states "That's fine.  I don't need pneumonia."    Diet Recommendation  Continue with Current Diet: NPO Initiate / Change Diet: NPO (d/c po trials with SLP)    SLP Plan     Pertinent Vitals/Pain n/a   Swallowing Goals  SLP Swallowing Goals Swallow Study Goal #3 - Progress: Not met  General Temperature Spikes Noted: No Respiratory Status: Room air Behavior/Cognition: Alert;Cooperative;Pleasant mood Oral Cavity - Dentition: Adequate natural dentition Patient Positioning: Upright in bed  Oral Cavity - Oral Hygiene Does patient have any of the following "at risk" factors?: Other - dysphagia Brush patient's teeth BID with toothbrush (using toothpaste with fluoride): Yes Patient is HIGH RISK - Oral Care Protocol followed (see row info): Yes Patient is AT RISK - Oral Care Protocol followed (see row info): Yes Patient is mechanically ventilated,  follow VAP prevention protocol for oral care: Oral care provided every 4 hours   Dysphagia Treatment Treatment focused on: Upgraded PO texture trials;Facilitation of pharyngeal phase Treatment Methods/Modalities: Skilled observation (Compensatory strategies) Patient observed directly with PO's: Yes Type of PO's observed: Ice chips;Dysphagia 1 (puree) Feeding: Total assist Liquids provided via: Teaspoon Oral Phase Signs & Symptoms: Prolonged oral phase Pharyngeal Phase Signs & Symptoms: Suspected delayed swallow initiation Type of cueing: Verbal;Tactile Amount of cueing: Moderate   Will d/c ST for now. Continue PEG TF's and f/u ST at SNF.  Recommend NPO (d/c po trials with SLP).       Maryjo Rochester T 05/05/2013, 11:47 AM

## 2013-05-05 NOTE — Progress Notes (Signed)
ANTICOAGULATION CONSULT NOTE - Initial Consult  Pharmacy Consult for Warfarin Indication: Recent DVT (04/12/13) and CVA (03/31/13)  No Known Allergies  Patient Measurements: Height: 5\' 11"  (180.3 cm) Weight: 210 lb 5.1 oz (95.4 kg) IBW/kg (Calculated) : 75.3  Vital Signs: Temp: 99.1 F (37.3 C) (09/25 0450) Temp src: Oral (09/25 0450) BP: 99/62 mmHg (09/25 0450) Pulse Rate: 77 (09/25 0450)  Labs:  Recent Labs  05/03/13 0210 05/04/13 0425 05/05/13 0428  HGB 8.2* 9.2* 8.4*  HCT 24.9* 27.7* 25.7*  PLT 253 317 297  LABPROT 31.8* 24.2* 19.5*  INR 3.23* 2.26* 1.70*  CREATININE 0.84 0.77 0.72    Estimated Creatinine Clearance: 118.6 ml/min (by C-G formula based on Cr of 0.72).   Medical History: Past Medical History  Diagnosis Date  . Dysphagia   . DVT (deep venous thrombosis)   . Hypertension   . Depression   . 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 antiseptic oral rinse  15 mL Mouth Rinse q12n4p  . chlorhexidine  15 mL Mouth Rinse BID  . feeding supplement  30 mL Per Tube Q1500  . lip balm  1 application Topical BID  . pantoprazole (PROTONIX) IV  40 mg Intravenous Q12H  . potassium chloride  40 mEq Per Tube Once  . Warfarin - Pharmacist Dosing Inpatient   Does not apply q1800   Infusions:  . dextrose 5 % with kcl 10 mL/hr at 05/04/13 1711  . feeding supplement (OSMOLITE 1.5 CAL) 1,000 mL (05/04/13 0957)    Assessment: 57 yo male admitted to [facility] 9/21 with rectal bleeding and supratherpeutic INR s/p recent coumadin initiation following CVA 03/31/13 and DVT 04/12/13. CT abd/pelvix shows extensive pneumoperitoneaum, believed to be a sequale of PEG placement. Given FFP to reverse INR on admit and warfarin held 9/20 - 9/23, resumed 9/24.  Nursing home MAR reports coumadin PTA dose: 5mg  via PEG tube  daily.  TF resumed 9/23, currently running at 42ml/hr; barium swallow eval shows continued aspiration  CBC: Hgb down 9.2--> 8.4, no bleeding per RN  Will cautiously order larger dose of coumadin tonight to help boost INR but will likely need lower maintenance doses   Per Care Everywhere notes from [facility]:  Pt admitted 8/21 for acute CVA with unclear etiology. TTE neg, no cardiac arrhythmia found, TEE ordered but canceled as pt too unstable. MD planned for outpt TEE when able to better tolerate. Hypercoagulable study negative. Started on ASA, lipitor.   DVT noted 9/2, started on coumadin with plans for min 3 months therapy, needs w/u for underlying thrombophilia.   Goal of Therapy:  INR 2-3   Plan:   Coumadin 5mg  via PEG today  Daily PT/INR  Haynes Hoehn, PharmD 05/05/2013, 8:14 AM  Pager: [phone]

## 2013-05-05 NOTE — Discharge Summary (Addendum)
Physician Discharge Summary  Oluwatimileyin Vivier GMW:[id number] DOB: 12/24/1955 DOA: 04/30/2013  PCP: No primary provider on file.  Admit date: 04/30/2013 Discharge date: 05/05/2013  Time spent: 45 minutes  Recommendations for Outpatient Follow-up:  -Will be discharged to SNF today. -Please follow INR and adjust coumadin dosing as required. -Needs follow up with Urology in about 10 days for voiding trial and decision as to when to remove foley catheter.   Discharge Diagnoses:  Principal Problem:   Pneumoperitoneum Active Problems:   Lower GI bleed   UTI (urinary tract infection)   Supratherapeutic INR   Chronic indwelling Foley catheter   Jejunostomy tube present (?originally G tube?)   Laceration of eyebrow s/p repair 04/26/2013   Hemiparesis affecting right side as late effect of stroke   Dysarthria as late effect of stroke   DVT of right axillary vein, acute   Anemia   Discharge Condition: Stable and improved  Filed Weights   05/01/13 0415 05/04/13 0505  Weight: 83.5 kg (184 lb 1.4 oz) 95.4 kg (210 lb 5.1 oz)    History of present illness:  Patient is a 57 year old male with history of hypertension, history of DVT on Coumadin who was admitted to [facility] recently with an acute stroke involving a large area on the left side with residual right hemiplegia and dysphasia was discharged to skilled nursing facility. He had a PEG tube placed in the hospital due to dysphagia. Patient sent from there as he had a fever with one episode of bright red blood per rectum on the day of admission. History primarily provided by brother at bedside and from ED notes. Patient was noted to have a blighted blood mixed with stool by the nursing home staff. Mother also reports the patient was complaining of some abdominal pain for the last 1 week. Patient was noted to have a fever of 103F in the nursing home and with this into the ED. Patient did not have any chills, nausea or vomiting. Did not have  any chest pain, dizziness, palpitation, shortness of breath, new weakness, muscle or joint pains. Denies any diarrhea dysuria or hematuria. We were asked to admit him for further evaluation and management.   Hospital Course:   Rectal bleeding in context of supra therapeutic INR  - In the setting of supratherapeutic INR  - GI consultation & followup appreciated. Discussed with Dr. Jeani Hawking on 9/23 who advised starting tube feeds, no EGD at this time unless recurrent bleeding, and recommends resuming Coumadin as INR dictates.  - Coumadin has been started and INR should be followed at [facility] for coumadin dosing adjustments.  UTI  - Cx with mult morphotypes.  -Will DC rocephin.   Anemia- ABLA on chronic anemia  - Hemoglobin drop of about 2.5 g since admission  - Follow H&H closely and transfuse if Hb drops any further.  - Stable hemoglobin over the last 24 hours.   Hypokalemia  - Repleted. -K is 3.6 on DC.  Pneumoperitoneum  - Currently asymptomatic. No evidence of shock or peritonitis.  - Could be secondary to recent tube placement (POD # 22).  - Continue conservative management.  - Surgery and GI consultation appreciated. CCS/GI cleared for tube feed.  -Ok for DC from surgical standpoint.  Leukocytosis  - Secondary to UTI and stress response.  - resolved   Prostatomegaly/urinary retention/mild bilateral hydronephrosis  - Continue Foley catheter for now and may consider discharging on same until outpatient urology consultation.  - Outpatient workup i.e. PSA etc.  Including urology consultation   Recent large left CVA/right hemiplegia/dysphagia/expressive aphasia/right axillary vein DVT per GI note  - Unclear if on Coumadin for DVT alone (if then? Duration) or for CVA too. This needs to be clarified.  - Failed MBS.  -Continue NPO with tube feeds for now.   Hypertension  - Stable.   DVT/coagulopathy secondary to Coumadin  - Management as above.   Procedures:  None    Consultations:  GI  Surgery  Discharge Instructions  Discharge Orders   Future Orders Complete By Expires   Continue foley catheter  As directed    Discontinue IV  As directed    Increase activity slowly  As directed        Medication List    STOP taking these medications       aspirin 81 MG chewable tablet     enoxaparin 100 MG/ML injection  Commonly known as:  LOVENOX     senna-docusate 8.6-50 MG per tablet  Commonly known as:  Senokot-S      TAKE these medications       acetaminophen 160 MG/5ML liquid  Commonly known as:  TYLENOL  Take 640 mg by mouth every 4 (four) hours as needed for fever.     atorvastatin 40 MG tablet  Commonly known as:  LIPITOR  40 mg by PEG Tube route daily.     feeding supplement (OSMOLITE 1.5 CAL) Liqd  Place 1,000 mLs into feeding tube continuous.     QUEtiapine 25 MG tablet  Commonly known as:  SEROQUEL  25 mg by PEG Tube route at bedtime.     sertraline 25 MG tablet  Commonly known as:  ZOLOFT  25 mg by PEG Tube route daily.     warfarin 5 MG tablet  Commonly known as:  COUMADIN  5 mg by PEG Tube route daily.       No Known Allergies    The results of significant diagnostics from this hospitalization (including imaging, microbiology, ancillary and laboratory) are listed below for reference.    Significant Diagnostic Studies: Dg Chest 2 View  04/30/2013   CLINICAL DATA:  Pain and fever  EXAM: CHEST  2 VIEW  COMPARISON:  None.  FINDINGS: There is extensive pneumoperitoneum. There is no edema or consolidation. Heart is upper normal in size with normal pulmonary vascularity. No adenopathy. No bone lesions.  IMPRESSION: Pneumoperitoneum.  No edema or consolidation.  CriticalValue/emergent results were called by telephone at the time of interpretation on 04/30/2013 at 8:18 PMto the emergency department physician covering [facility], who verbally acknowledged these results.   Electronically Signed   By: Bretta Bang   On: 04/30/2013 20:20   Dg Abd 1 View  05/01/2013   CLINICAL DATA:  Evaluate for gastric leak  EXAM: ABDOMEN - 1 VIEW  COMPARISON:  CT abdomen pelvis dated 05/01/2013  FINDINGS: Contrast was administered via the malpositioned gastrostomy tube (currently located within the jejunum).  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 A corresponding abnormality is present on CT (series 7/images 10-14).  No free extraluminal contrast is noted.  IMPRESSION: Malpositioned gastrostomy tube within the jejunum.  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  No free extraluminal contrast is noted.   Electronically Signed   By: Charline Bills M.D.   On: 05/01/2013 13:37   Dg Abd 1 View  04/30/2013   CLINICAL DATA:  Check PEG tube placement  EXAM:  ABDOMEN - 1 VIEW  COMPARISON:  None.  FINDINGS: Contrast administered via left-sided feeding tube opacifies nondilated small bowel. There are some other high-density areas in the abdomen which are likely within bowel - present in all 4 quadrants. Pneumoperitoneum better seen previously. Nonobstructive bowel gas pattern. No abnormal intra-abdominal mass effect. Clear lung bases. Focally advanced degenerative disc changes at L4-5.  IMPRESSION: 1. Jejunostomy tube injection opacifies small bowel. Small collections of contrast elsewhere in the abdomen are favored intraluminal, which could be confirmed with CT if there is ongoing suspicion for tube leakage.  2.  Pneumoperitoneum.   Electronically Signed   By: Tiburcio Pea   On: 04/30/2013 23:15   Ct Head Wo Contrast - If Head Trauma Is Known/suspected And/or Pt Is Taking Anticoagulant  04/26/2013   CLINICAL DATA:  57 year old male status post fall with blunt trauma, hematoma, laceration.  EXAM: CT HEAD WITHOUT CONTRAST  CT MAXILLOFACIAL WITHOUT CONTRAST  CT CERVICAL SPINE WITHOUT CONTRAST  TECHNIQUE: Multidetector CT imaging  of the head, cervical spine, and maxillofacial structures were performed using the standard protocol without intravenous contrast. Multiplanar CT image reconstructions of the cervical spine and maxillofacial structures were also generated.  COMPARISON:  None.  FINDINGS: CT HEAD FINDINGS  Right forehead scalp hematoma measuring up to 9 mm in thickness. Right frontal bone intact. Superior scalp soft tissues within normal limits. No calvarium fracture. Mastoids are clear.  Confluent and profound hypodensity in the left brainstem (series 2, image 8). Elsewhere posterior fossa gray-white matter differentiation within normal limits. No midline shift, mass effect, or evidence of intracranial mass lesion. No ventriculomegaly. No acute intracranial hemorrhage identified. No evidence of cortically based acute infarction identified. No suspicious intracranial vascular hyperdensity.  CT MAXILLOFACIAL FINDINGS  Right periorbital soft tissue injury contiguous with that at the right forehead. Right globe intact. Bilateral intraorbital soft tissues are within normal limits.  Visible deep soft tissue spaces of the face and neck remarkable for bilateral retropharyngeal carotid arteries. Small volume retained secretions in the pharynx.  Minor paranasal sinus mucosal thickening. Chronic appearing nasal process of the maxilla fractures. Mandible intact. No orbital wall fracture. No acute facial fracture.  CT CERVICAL SPINE FINDINGS  Straightening of cervical lordosis. Visualized skull base is intact. No atlanto-occipital dissociation. Cervicothoracic junction alignment is within normal limits. Bilateral posterior element alignment is within normal limits. Chronic disc and endplate degeneration most pronounced at C5-C6. No acute cervical fracture identified.  Negative lung apices. Visible upper thoracic levels appear intact. Visualized paraspinal soft tissues are within normal limits. Retropharyngeal course of both carotid arteries at  the upper cervical spine level.  IMPRESSION: CT HEAD IMPRESSION  1. No acute traumatic injury to the brain. Chronic left brainstem infarct.  2.  Forehead scalp hematoma without underlying fracture.  CT MAXILLOFACIAL IMPRESSION  Right periorbital soft tissue injury. No acute facial fracture identified.  CT CERVICAL SPINE IMPRESSION  No acute fracture or listhesis identified in the cervical spine. Ligamentous injury is not excluded.   Electronically Signed   By: Augusto Gamble M.D.   On: 04/26/2013 07:59   Ct Cervical Spine Wo Contrast  04/26/2013   CLINICAL DATA:  57 year old male status post fall with blunt trauma, hematoma, laceration.  EXAM: CT HEAD WITHOUT CONTRAST  CT MAXILLOFACIAL WITHOUT CONTRAST  CT CERVICAL SPINE WITHOUT CONTRAST  TECHNIQUE: Multidetector CT imaging of the head, cervical spine, and maxillofacial structures were performed using the standard protocol without intravenous contrast. Multiplanar CT image reconstructions of the cervical spine and  maxillofacial structures were also generated.  COMPARISON:  None.  FINDINGS: CT HEAD FINDINGS  Right forehead scalp hematoma measuring up to 9 mm in thickness. Right frontal bone intact. Superior scalp soft tissues within normal limits. No calvarium fracture. Mastoids are clear.  Confluent and profound hypodensity in the left brainstem (series 2, image 8). Elsewhere posterior fossa gray-white matter differentiation within normal limits. No midline shift, mass effect, or evidence of intracranial mass lesion. No ventriculomegaly. No acute intracranial hemorrhage identified. No evidence of cortically based acute infarction identified. No suspicious intracranial vascular hyperdensity.  CT MAXILLOFACIAL FINDINGS  Right periorbital soft tissue injury contiguous with that at the right forehead. Right globe intact. Bilateral intraorbital soft tissues are within normal limits.  Visible deep soft tissue spaces of the face and neck remarkable for bilateral  retropharyngeal carotid arteries. Small volume retained secretions in the pharynx.  Minor paranasal sinus mucosal thickening. Chronic appearing nasal process of the maxilla fractures. Mandible intact. No orbital wall fracture. No acute facial fracture.  CT CERVICAL SPINE FINDINGS  Straightening of cervical lordosis. Visualized skull base is intact. No atlanto-occipital dissociation. Cervicothoracic junction alignment is within normal limits. Bilateral posterior element alignment is within normal limits. Chronic disc and endplate degeneration most pronounced at C5-C6. No acute cervical fracture identified.  Negative lung apices. Visible upper thoracic levels appear intact. Visualized paraspinal soft tissues are within normal limits. Retropharyngeal course of both carotid arteries at the upper cervical spine level.  IMPRESSION: CT HEAD IMPRESSION  1. No acute traumatic injury to the brain. Chronic left brainstem infarct.  2.  Forehead scalp hematoma without underlying fracture.  CT MAXILLOFACIAL IMPRESSION  Right periorbital soft tissue injury. No acute facial fracture identified.  CT CERVICAL SPINE IMPRESSION  No acute fracture or listhesis identified in the cervical spine. Ligamentous injury is not excluded.   Electronically Signed   By: Augusto Gamble M.D.   On: 04/26/2013 07:59   Ct Abdomen Pelvis W Contrast  05/01/2013   *RADIOLOGY REPORT*  Clinical Data: GI bleeding, fever, blood and air  CT ABDOMEN AND PELVIS WITH CONTRAST  Technique:  Multidetector CT imaging of the abdomen and pelvis was performed following the standard protocol during bolus administration of intravenous contrast.  Contrast: OMNIPAQUE IOHEXOL 300 MG/ML  SOLN  Comparison: CT abdomen pelvis with and without contrast 11/03/2011 and MR abdomen 06/30/2003.  Findings: Lung bases:  There is mild bibasilar atelectasis. Calcified granuloma in the left lower lobe.  Negative for pleural effusion.  Negative for pneumothorax at the lung bases.  There  is a moderate amount of pneumoperitoneum, predominately seen in the nondependent portion of the abdomen, with multiple tiny locules scattered throughout the upper abdomen bilaterally.  A pull-through type feeding tube traverses the left anterior abdominal wall and is positioned within a jejunal loop in the left upper quadrant.  Oral contrast was administered via the feeding tube, and by the time the exam was performed, the contrast has progressed throughout the small bowel loops distal to the feeding tube, and into the colon, to the level of the mid sigmoid colon. No free intraperitoneal spill of oral contrast is seen.  The stomach is decompressed.  The duodenum is unremarkable.  No free air is seen posterior to the stomach, or adjacent to the duodenum.  2.3 x 2.7 cm left adrenal gland nodule is stable.  This is previously been evaluated with MRI in 2004, and shown to be most consistent with a benign adenoma.  The patient has a markedly  enlarged prostate gland (5.8 centers AP diameter by 6.4 cm transverse diameter by 6.1 cm craniocaudal span. The patient's urinary bladder is distended, and there is mild bilateral hydroureteronephrosis.  These findings raise the possibility of possible bladder outlet obstruction related to the prostamegaly. There is a single tiny locule of gas in the nondependent portion of the urinary bladder.  Urinary bladder wall is normal in thickness.  There is a soft tissue nodule in the lower anterior abdomen, directly anterior to the anterior wall of the distended urinary bladder there is not deftly present on prior CT. This measures 1.5 x1.8 x 0.7 cm.  There is suggestion of mild urothelial enhancement of both ureters. Suggest correlation with urinalysis to exclude urinary tract infection.  Bilateral low density renal lesions appear without significant change in most consistent with benign cysts.  The liver, gallbladder, spleen, right adrenal gland, and pancreas are within normal limits.   Sigmoid colon diverticulosis is noted, without acute inflammatory change. There are a few locules of gas just inferior to the cecum, and there is some fluid in this region of the right lower quadrant. There is also a small amount of ascites in the dependent portion of the pelvis.  The appendix is normal.  Abdominal aorta normal in caliber.  Negative for lymphadenopathy.  No acute bony abnormalities identified.  No suspicious osseous lesion.  IMPRESSION:  1. Moderate volume of pneumoperitoneum.  No free intraperitoneal spill of contrast or focal gastrointestinal tract wall thickening is identified. A definite etiology for the free intraperitoneal air is not identified.  A pull-through type feeding tube is present within a loop of jejunum in the left upper quadrant.  Consider correlation with operative/procedure note.  This volume of pneumoperitoneum would not be expected  2 weeks after feeding tube placement (reportedly the procedure was performed approximately 2 weeks ago). 2. Marked prostamegaly.  Cannot exclude prostate carcinoma. Suggest correlation with PSA values.  The urinary bladder is markedly distended and there is mild bilateral hydroureteronephrosis. Findings raise the possibility of bladder outlet obstruction secondary to prostamegaly. 3.  Single locule of gas in the urinary bladder.  Question if there has been recent Foley catheter placement or other instrumentation to explain this. 4.  Mild urothelial enhancement of the ureters.  Cannot exclude urinary tract infection.  Suggest correlation with urinalysis. 5.  1.5 cm soft tissue nodule in the lower anterior abdomen, directly anterior to the urinary bladder.  This appears new/increased compared to prior studies.  A reactive lymph node or metastatic peritoneal implant cannot be excluded if the patient has an underlying malignancy. 6.  Small volume of pelvic ascites. 7.  Stable left adrenal gland adenoma.   Original Report Authenticated By: Britta Mccreedy,  M.D.   Dg Kayleen Memos W/water Sol Cm  05/01/2013   CLINICAL DATA:  Evaluate for gastric leak  EXAM: ESOPHAGUS/BARIUM SWALLOW/TABLET STUDY  TECHNIQUE: Water-soluble contrast was injected into the existing NG tube and imaging was obtained.  COMPARISON:  None.  FLUOROSCOPY TIME:  1 min and 1 2nd.  FINDINGS: Contrast fills the stomach. There is no extravasation of contrast into the peritoneal space. Contrast does fill a fistula to the adjacent jejunum.  IMPRESSION: There is no evidence of intraperitoneal leak of contrast from the stomach. 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   Electronically Signed   By: Maryclare Bean M.D.   On: 05/01/2013 14:51   Dg Swallowing Func-speech Pathology  05/04/2013   Chales Abrahams, CCC-SLP     05/04/2013 11:18 AM Objective Swallowing Evaluation: Modified Barium Swallowing Study   Patient Details  Name: Dominion Kathan MRN: [id number] Date of Birth: 01-Mar-1956  Today's Date: 05/04/2013 Time: 0900-0924 SLP Time Calculation (min): 24 min  Past Medical History:  Past Medical History  Diagnosis Date  . Dysphagia   . DVT (deep venous thrombosis)   . Hypertension   . Depression   . 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   He was lethargic during large portion of hospital stay at [facility]  per chart review and PEG  was placed 04/18/13.  Pulmonary issues  resolved and pt has been working with SNF SLP.  Order for swallow  evaluation received and MBS requested d/t h/o CVA, severe  dysphagia.       Assessment / Plan / Recommendation Clinical Impression  Dysphagia Diagnosis: Moderate oral phase dysphagia;Moderate  pharyngeal phase dysphagia Clinical impression: Pt presents with moderate sensorimotor  oropharyngeal dysphagia with decreased oral bolus cohesion  resulting in premature spillage of barium into pharynx. Also oral  residuals to which pt does not have awareness prematurely spilled  into pharynx without sensation - requiring cue to swallow.  Pharyngeal swallow is strong albeit delayed with boluses filling  pyriform sinus. Trace (SILENT) aspiration of nectar and thin  (below cords) was cleared with cued cough. Chin tuck posture did  not prevent penetration.  Due to sensorimotor impairments and weak cough, rec continue NPO  except ice chips and continue PEG tube feeding for nutritional  support.   PO with SLP only of soft/thin - with follow up at [facility]  for continued dysphagia treatment.  During po with SLP, pt to  swallow twice with every sip and intermittently cough/throat  clear.    Pt with oral cavity full of secretions without awareness  indicating sensory deficits, cue to swallow effective to clear.    Brother reports pt will cough at times, suspect gross aspiration  of secretions with sensation.  Instructed pt and brother to  encourage pt to swallow his secretions.    Pt and brother educated to results of testing and recommendations  - both agreeable to plan.      Treatment Recommendation  Therapy as outlined in treatment plan below    Diet Recommendation NPO;Ice chips PRN after oral care (po with  slp only and follow up at SNF)   Medication Administration: Via alternative means    Other  Recommendations   N/A  Follow Up Recommendations    SNF   Frequency and Duration min 2x/week  2 weeks       SLP Swallow Goals Goal #3:  Pt will swallow trials of soft/thin with SLP conducting  compensation strategies without s/s of aspiration with min cues  with goal to aid transition into nutrition by  mouth.     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   He was lethargic during large portion of hospital stay at [facility]  per chart review and PEG was placed 04/18/13.  Pulmonary issues  resolved and pt has been working with SNF SLP.  Order for swallow  evaluation received and MBS requested d/t h/o CVA, severe  dysphagia.   Type of Study: Modified Barium Swallowing Study Reason for Referral: Objectively evaluate swallowing function Diet Prior to this Study: NPO (except ice that pt has been  managing) Temperature Spikes Noted: No Respiratory Status: Room air Behavior/Cognition: Alert;Cooperative;Pleasant mood Oral Motor / Sensory Function: Impaired motor;Impaired sensory Oral impairment: Right facial;Right lingual;Right labial  (severely dysarthric) Self-Feeding Abilities: Able to feed self;Needs assist Patient Positioning: Upright in chair Baseline Vocal Quality: Low vocal intensity Volitional Cough: Weak Volitional Swallow: Able to elicit Anatomy: Within functional limits Pharyngeal Secretions: Standing secretions in (comment) (oral  cavity, cued swallow aided clearance)    Reason for Referral Objectively evaluate swallowing function   Oral Phase Oral Preparation/Oral Phase Oral Phase: Impaired Oral - Honey Oral - Honey Cup: Weak lingual manipulation;Reduced posterior  propulsion Oral - Nectar Oral - Nectar Teaspoon: Weak lingual manipulation;Lingual/palatal  residue;Reduced posterior propulsion Oral - Nectar Cup: Weak lingual manipulation;Lingual/palatal   residue;Reduced posterior propulsion Oral - Nectar Straw: Weak lingual manipulation;Reduced posterior  propulsion;Lingual/palatal residue Oral - Thin Oral - Thin Teaspoon: Weak lingual manipulation;Reduced posterior  propulsion Oral - Thin Cup: Weak lingual manipulation;Lingual/palatal  residue;Reduced posterior propulsion Oral - Thin Straw: Not tested Oral - Solids Oral - Puree: Weak lingual manipulation;Reduced posterior  propulsion Oral - Regular: Weak lingual manipulation;Reduced posterior  propulsion Oral Phase - Comment Oral Phase - Comment: verbal cue to increase speed of swallow not  effective   Pharyngeal Phase Pharyngeal Phase Pharyngeal Phase: Impaired Pharyngeal - Honey Pharyngeal - Honey Cup: Premature spillage to pyriform sinuses Pharyngeal - Nectar Pharyngeal - Nectar Teaspoon: Premature spillage to pyriform  sinuses Pharyngeal - Nectar Cup: Premature spillage to pyriform  sinuses;Penetration/Aspiration during swallow Penetration/Aspiration details (nectar cup): Material enters  airway, remains ABOVE vocal cords and not ejected out Pharyngeal - Nectar Straw: Premature spillage to pyriform  sinuses;Trace aspiration;Penetration/Aspiration during swallow  (chin tuck not helpful, cued cough removed trace aspirates) Penetration/Aspiration details (nectar straw): Material enters  airway, passes BELOW cords without attempt by patient to eject  out (silent aspiration) Pharyngeal - Thin Pharyngeal - Thin Teaspoon: Premature spillage to pyriform  sinuses Pharyngeal - Thin Cup: Premature spillage to pyriform  sinuses;Trace aspiration;Penetration/Aspiration during swallow Penetration/Aspiration details (thin cup): Material enters  airway, passes BELOW cords without attempt by patient to eject  out (silent aspiration) Pharyngeal - Solids Pharyngeal - Puree: Premature spillage to pyriform sinuses Pharyngeal - Regular: Premature spillage to pyriform sinuses Pharyngeal Phase - Comment Pharyngeal Comment: pharyngeal  swallow was strong albeit delayed,  oral residue of nectar prematurely spilled into pharynx without  pt sensation - he required verbal cue to swallow   Cervical Esophageal Phase    GO    Cervical Esophageal Phase Cervical Esophageal Phase: [facility]         Donavan Burnet, MS [facility] SLP [phone]    Ct  Maxillofacial Wo Cm  04/26/2013   CLINICAL DATA:  57 year old male status post fall with blunt trauma, hematoma, laceration.  EXAM: CT HEAD WITHOUT CONTRAST  CT MAXILLOFACIAL WITHOUT CONTRAST  CT CERVICAL SPINE WITHOUT CONTRAST  TECHNIQUE: Multidetector CT imaging of the head, cervical spine, and maxillofacial structures were performed using the standard protocol without intravenous contrast. Multiplanar CT image reconstructions of the cervical spine and maxillofacial structures were also generated.  COMPARISON:  None.  FINDINGS: CT HEAD FINDINGS  Right forehead scalp hematoma measuring up to 9 mm in thickness. Right frontal bone intact. Superior scalp soft tissues within normal limits. No calvarium fracture. Mastoids are clear.  Confluent and profound hypodensity in the left brainstem (series 2, image 8). Elsewhere posterior fossa gray-white matter differentiation within normal limits. No midline shift, mass effect, or evidence of intracranial mass lesion. No ventriculomegaly. No acute intracranial hemorrhage identified. No evidence of cortically based acute infarction identified. No suspicious intracranial vascular hyperdensity.  CT MAXILLOFACIAL FINDINGS  Right periorbital soft tissue injury contiguous with that at the right forehead. Right globe intact. Bilateral intraorbital soft tissues are within normal limits.  Visible deep soft tissue spaces of the face and neck remarkable for bilateral retropharyngeal carotid arteries. Small volume retained secretions in the pharynx.  Minor paranasal sinus mucosal thickening. Chronic appearing nasal process of the maxilla fractures. Mandible intact. No orbital wall fracture. No  acute facial fracture.  CT CERVICAL SPINE FINDINGS  Straightening of cervical lordosis. Visualized skull base is intact. No atlanto-occipital dissociation. Cervicothoracic junction alignment is within normal limits. Bilateral posterior element alignment is within normal limits. Chronic disc and endplate degeneration most pronounced at C5-C6. No acute cervical fracture identified.  Negative lung apices. Visible upper thoracic levels appear intact. Visualized paraspinal soft tissues are within normal limits. Retropharyngeal course of both carotid arteries at the upper cervical spine level.  IMPRESSION: CT HEAD IMPRESSION  1. No acute traumatic injury to the brain. Chronic left brainstem infarct.  2.  Forehead scalp hematoma without underlying fracture.  CT MAXILLOFACIAL IMPRESSION  Right periorbital soft tissue injury. No acute facial fracture identified.  CT CERVICAL SPINE IMPRESSION  No acute fracture or listhesis identified in the cervical spine. Ligamentous injury is not excluded.   Electronically Signed   By: Augusto Gamble M.D.   On: 04/26/2013 07:59    Microbiology: Recent Results (from the past 240 hour(s))  URINE CULTURE     Status: None   Collection Time    04/30/13  7:06 PM      Result Value Range Status   Specimen Description URINE, CATHETERIZED   Final   Special Requests NONE   Final   Culture  Setup Time     Final   Value: 05/01/2013 02:26     Performed at [facility] Count     Final   Value: >=100,000 COLONIES/ML     Performed at [facility]   Culture     Final   Value: Multiple bacterial morphotypes present, none predominant. Suggest appropriate recollection if clinically indicated.     Performed at [facility]   Report Status 05/03/2013 FINAL   Final  MRSA PCR SCREENING     Status: None   Collection Time    05/01/13  6:39 AM      Result Value Range Status   MRSA by PCR NEGATIVE  NEGATIVE Final   Comment:            The GeneXpert MRSA Assay (  FDA      approved for NASAL specimens     only), is one component of a     comprehensive MRSA colonization     surveillance program. It is not     intended to diagnose MRSA     infection nor to guide or     monitor treatment for     MRSA infections.  CULTURE, BLOOD (ROUTINE X 2)     Status: None   Collection Time    05/01/13 12:55 PM      Result Value Range Status   Specimen Description BLOOD LEFT ARM   Final   Special Requests BOTTLES DRAWN AEROBIC AND ANAEROBIC 1.5 CC EACH   Final   Culture  Setup Time     Final   Value: 05/01/2013 21:44     Performed at [facility]   Culture     Final   Value:        BLOOD CULTURE RECEIVED NO GROWTH TO DATE CULTURE WILL BE HELD FOR 5 DAYS BEFORE ISSUING A FINAL NEGATIVE REPORT     Performed at [facility]   Report Status PENDING   Incomplete  CULTURE, BLOOD (ROUTINE X 2)     Status: None   Collection Time    05/01/13  1:05 PM      Result Value Range Status   Specimen Description BLOOD LEFT ARM   Final   Special Requests BOTTLES DRAWN AEROBIC AND ANAEROBIC 6 CC EACH   Final   Culture  Setup Time     Final   Value: 05/01/2013 21:44     Performed at [facility]   Culture     Final   Value:        BLOOD CULTURE RECEIVED NO GROWTH TO DATE CULTURE WILL BE HELD FOR 5 DAYS BEFORE ISSUING A FINAL NEGATIVE REPORT     Performed at [facility]   Report Status PENDING   Incomplete  CLOSTRIDIUM DIFFICILE BY PCR     Status: None   Collection Time    05/01/13  9:20 PM      Result Value Range Status   C difficile by pcr NEGATIVE  NEGATIVE Final   Comment: Performed at [facility]     Labs: Basic Metabolic Panel:  Recent Labs Lab 05/01/13 1641 05/02/13 0440 05/02/13 1708 05/03/13 0210 05/04/13 0425 05/05/13 0428  NA 151* 145 141 139 138 137  K 3.4* 2.9* 3.5 3.2* 3.3* 3.6  CL 114* 110 106 105 105 106  CO2 29 29 27 27 24 26   GLUCOSE 101* 131* 116* 97 152* 133*  BUN 16 15 13 12 13 11   CREATININE 0.82  0.81 0.82 0.84 0.77 0.72  CALCIUM 9.6 9.2 9.0 9.3 8.9 8.6  MG  --  2.3  --   --  2.0  --    Liver Function Tests:  Recent Labs Lab 04/30/13 2005  AST 33  ALT 104*  ALKPHOS 54  BILITOT 0.6  PROT 7.0  ALBUMIN 2.4*   No results found for this basename: LIPASE, AMYLASE,  in the last 168 hours No results found for this basename: AMMONIA,  in the last 168 hours CBC:  Recent Labs Lab 04/30/13 2005  05/02/13 1035 05/02/13 1711 05/03/13 0210 05/04/13 0425 05/05/13 0428  WBC 15.5*  < > 7.5 7.7 7.3 6.3 5.3  NEUTROABS 12.8*  --   --   --   --   --   --  HGB 10.3*  < > 7.9* 8.3* 8.2* 9.2* 8.4*  HCT 32.0*  < > 24.4* 25.7* 24.9* 27.7* 25.7*  MCV 99.4  < > 98.0 97.7 96.5 95.2 95.9  PLT 214  < > 226 253 253 317 297  < > = values in this interval not displayed. Cardiac Enzymes: No results found for this basename: CKTOTAL, CKMB, CKMBINDEX, TROPONINI,  in the last 168 hours BNP: BNP (last 3 results) No results found for this basename: PROBNP,  in the last 8760 hours CBG:  Recent Labs Lab 05/04/13 1653 05/04/13 2019 05/05/13 0007 05/05/13 0412 05/05/13 0743  GLUCAP 143* 109* 125* 137* 131*       Signed:  Chaya Jan  [facility] Pager: [phone] 05/05/2013, 9:55 AM

## 2013-05-05 NOTE — Progress Notes (Addendum)
Patient cleared for discharge. Auth obtained by [facility]. CSW spoke with patient's brother and informed of discharge. He is agreeable to same. CSW met with patient. He is also happy that he is ready to go. Packet copied and placed in [place]. ptar called for transportation.  [name] [name] C. Kynzley Dowson MSW, LCSW [phone]

## 2013-05-05 NOTE — Progress Notes (Signed)
Physical Therapy Treatment Patient Details Name: [name] [name] MRN: [id number] DOB: August 02, 1956 Today's Date: 05/05/2013 Time: 4098-1191 PT Time Calculation (min): 24 min  PT Assessment / Plan / Recommendation  History of Present Illness     PT Comments   Pt progressing  Follow Up Recommendations  SNF     Does the patient have the potential to tolerate intense rehabilitation     Barriers to Discharge        Equipment Recommendations  None recommended by PT    Recommendations for Other Services    Frequency Min 4X/week   Progress towards PT Goals Progress towards PT goals: Progressing toward goals  Plan Current plan remains appropriate    Precautions / Restrictions Precautions Precautions: Fall Precaution Comments: dense right hemiplegia   Pertinent Vitals/Pain Denies pain    Mobility  Bed Mobility Bed Mobility: Rolling Right;Right Sidelying to Sit;Sitting - Scoot to [place] of Bed Rolling Right: 3: Mod assist;With rail Right Sidelying to Sit: 3: Mod assist Sitting - Scoot to Edge of Bed: 3: Mod assist Details for Bed Mobility Assistance: cues for technique adn safety; pt continues to be mildly impulsive Transfers Transfers: Stand Pivot Transfers Sit to Stand: 1: +2 Total assist;From bed;From elevated surface Stand to Sit: To bed;To elevated surface;1: +2 Total assist Stand Pivot Transfers: 1: +2 Total assist Stand Pivot Transfers: Patient Percentage: 30% Details for Transfer Assistance: facilitation right knee extension to allow WBing through RLE in  standing and for transfer; requires +2 for RUE protection, lines, safety and knee support Ambulation/Gait Ambulation/Gait Assistance: Not tested (comment)    Exercises     PT Diagnosis:    PT Problem List:   PT Treatment Interventions:     PT Goals (current goals can now be found in the care plan section) Acute Rehab PT Goals Patient Stated Goal: to get stronger Time For Goal Achievement: 05/16/13 Potential to  Achieve Goals: Fair  Visit Information  Last PT Received On: 05/05/13 Assistance Needed: +2    Subjective Data  Subjective: what do you want me to do? Patient Stated Goal: to get stronger   Cognition  Cognition Arousal/Alertness: Awake/alert Behavior During Therapy: WFL for tasks assessed/performed Overall Cognitive Status: Within Functional Limits for tasks assessed    Balance  Static Sitting Balance Static Sitting - Balance Support: Feet supported;No upper extremity supported;Left upper extremity supported Static Sitting - Level of Assistance: 4: Min assist;3: Mod assist Static Sitting - Comment/# of Minutes: worked on lateral wt shift and correction to midline  End of Session PT - End of Session Equipment Utilized During Treatment: Gait belt Activity Tolerance: Patient tolerated treatment well Patient left: in chair;with call bell/phone within reach Nurse Communication: Need for lift equipment   GP     [facility] 05/05/2013, 1:15 PM

## 2013-05-05 NOTE — Progress Notes (Signed)
Subjective: Tolerating FT well  Objective: Vital signs in last 24 hours: Temp:  [98.2 F (36.8 C)-99.1 F (37.3 C)] 99.1 F (37.3 C) (09/25 0450) Pulse Rate:  [74-88] 77 (09/25 0450) Resp:  [20-25] 20 (09/25 0450) BP: (94-103)/(61-69) 99/62 mmHg (09/25 0450) SpO2:  [96 %-100 %] 96 % (09/25 0450) Last BM Date: 05/04/13 1075 ml via PEG. + BM  TM 99.1 INR 1.7Labs OK   Intake/Output from previous day: 09/24 0701 - 09/25 0700 In: 1846.8 [I.V.:321.8; NG/GT:1075] Out: 1175 [Urine:1175] Intake/Output this shift:    General appearance: alert, cooperative and no distress GI: soft, non-tender; bowel sounds normal; no masses,  no organomegaly  Lab Results:   Recent Labs  05/04/13 0425 05/05/13 0428  WBC 6.3 5.3  HGB 9.2* 8.4*  HCT 27.7* 25.7*  PLT 317 297    BMET  Recent Labs  05/04/13 0425 05/05/13 0428  NA 138 137  K 3.3* 3.6  CL 105 106  CO2 24 26  GLUCOSE 152* 133*  BUN 13 11  CREATININE 0.77 0.72  CALCIUM 8.9 8.6   PT/INR  Recent Labs  05/04/13 0425 05/05/13 0428  LABPROT 24.2* 19.5*  INR 2.26* 1.70*     Recent Labs Lab 04/30/13 2005  AST 33  ALT 104*  ALKPHOS 54  BILITOT 0.6  PROT 7.0  ALBUMIN 2.4*     Lipase  No results found for this basename: lipase     Studies/Results: Dg Swallowing Func-speech Pathology  05/04/2013   Chales Abrahams, CCC-SLP     05/04/2013 11:18 AM Objective Swallowing Evaluation: Modified Barium Swallowing Study   Patient Details  Name: [name] [name] MRN: [id number] Date of Birth: 06/21/1956  Today's Date: 05/04/2013 Time: 0900-0924 SLP Time Calculation (min): 24 min  Past Medical History:  Past Medical History  Diagnosis Date  . Dysphagia   . DVT (deep venous thrombosis)   . Hypertension   . Depression   . 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   He was lethargic during large portion of hospital stay at [facility]  per chart review and PEG was placed 04/18/13.  Pulmonary issues  resolved and pt has been working with SNF SLP.  Order for swallow  evaluation received and MBS requested d/t h/o CVA, severe  dysphagia.       Assessment / Plan / Recommendation Clinical Impression  Dysphagia Diagnosis: Moderate oral phase dysphagia;Moderate  pharyngeal phase dysphagia Clinical impression: Pt presents with moderate sensorimotor  oropharyngeal dysphagia with decreased oral bolus cohesion  resulting in premature spillage of barium into pharynx. Also oral  residuals to which pt does not have awareness prematurely spilled  into pharynx without sensation - requiring cue to swallow.  Pharyngeal swallow is strong albeit delayed with boluses filling  pyriform sinus. Trace (SILENT) aspiration of nectar and thin  (below cords) was cleared  with cued cough. Chin tuck posture did  not prevent penetration.  Due to sensorimotor impairments and weak cough, rec continue NPO  except ice chips and continue PEG tube feeding for nutritional  support.   PO with SLP only of soft/thin - with follow up at [facility]  for continued dysphagia treatment.  During po with SLP, pt to  swallow twice with every sip and intermittently cough/throat  clear.    Pt with oral cavity full of secretions without awareness  indicating sensory deficits, cue to swallow effective to clear.    Brother reports pt will cough at times, suspect gross aspiration  of secretions with sensation.   Instructed pt and brother to  encourage pt to swallow his secretions.    Pt and brother educated to results of testing and recommendations  - both agreeable to plan.      Treatment Recommendation  Therapy as outlined in treatment plan below    Diet Recommendation NPO;Ice chips PRN after oral care (po with  slp only and follow up at SNF)   Medication Administration: Via alternative means    Other  Recommendations   N/A  Follow Up Recommendations    SNF   Frequency and Duration min 2x/week  2 weeks       SLP Swallow Goals Goal #3: Pt will swallow trials of soft/thin with SLP conducting  compensation strategies without s/s of aspiration with min cues  with goal to aid transition into nutrition by mouth.     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   He was lethargic during large portion of hospital stay at [facility]  per chart review and PEG was placed 04/18/13.  Pulmonary issues  resolved and pt has been working with SNF SLP.  Order for swallow  evaluation received and MBS requested d/t h/o CVA, severe  dysphagia.   Type of Study: Modified Barium Swallowing Study Reason for Referral: Objectively evaluate swallowing function Diet Prior to this Study: NPO (except ice that pt has been  managing) Temperature Spikes Noted: No Respiratory Status: Room air Behavior/Cognition: Alert;Cooperative;Pleasant mood Oral Motor / Sensory Function: Impaired motor;Impaired sensory Oral impairment: Right facial;Right lingual;Right labial  (severely dysarthric) Self-Feeding Abilities: Able to feed self;Needs assist Patient Positioning: Upright in chair Baseline Vocal Quality: Low vocal intensity Volitional Cough: Weak Volitional Swallow: Able to  elicit Anatomy: Within functional limits Pharyngeal Secretions: Standing secretions in (comment) (oral  cavity, cued swallow aided clearance)    Reason for Referral Objectively evaluate swallowing function   Oral Phase Oral Preparation/Oral Phase Oral Phase: Impaired Oral - Honey Oral - Honey Cup: Weak lingual manipulation;Reduced posterior  propulsion Oral - Nectar Oral - Nectar Teaspoon: Weak lingual manipulation;Lingual/palatal  residue;Reduced posterior propulsion Oral - Nectar Cup: Weak lingual manipulation;Lingual/palatal  residue;Reduced posterior propulsion Oral - Nectar Straw: Weak lingual manipulation;Reduced posterior  propulsion;Lingual/palatal residue Oral - Thin Oral - Thin Teaspoon: Weak lingual manipulation;Reduced posterior  propulsion Oral - Thin Cup: Weak lingual manipulation;Lingual/palatal  residue;Reduced posterior propulsion Oral - Thin Straw: Not tested Oral - Solids Oral - Puree: Weak lingual manipulation;Reduced posterior  propulsion Oral - Regular: Weak lingual manipulation;Reduced posterior  propulsion Oral Phase - Comment Oral Phase -  Comment: verbal cue to increase speed of swallow not  effective   Pharyngeal Phase Pharyngeal Phase Pharyngeal Phase: Impaired Pharyngeal - Honey Pharyngeal - Honey Cup: Premature spillage to pyriform sinuses Pharyngeal - Nectar Pharyngeal - Nectar Teaspoon: Premature spillage to pyriform  sinuses Pharyngeal - Nectar Cup: Premature spillage to pyriform  sinuses;Penetration/Aspiration during swallow Penetration/Aspiration details (nectar cup): Material enters  airway, remains ABOVE vocal cords and not ejected out Pharyngeal - Nectar Straw: Premature spillage to pyriform  sinuses;Trace aspiration;Penetration/Aspiration during swallow  (chin tuck not helpful, cued cough removed trace aspirates) Penetration/Aspiration details (nectar straw): Material enters  airway, passes BELOW cords without attempt by patient to eject  out (silent aspiration) Pharyngeal -  Thin Pharyngeal - Thin Teaspoon: Premature spillage to pyriform  sinuses Pharyngeal - Thin Cup: Premature spillage to pyriform  sinuses;Trace aspiration;Penetration/Aspiration during swallow Penetration/Aspiration details (thin cup): Material enters  airway, passes BELOW cords without attempt by patient to eject  out (silent aspiration) Pharyngeal - Solids Pharyngeal - Puree: Premature spillage to pyriform sinuses Pharyngeal - Regular: Premature spillage to pyriform sinuses Pharyngeal Phase - Comment Pharyngeal Comment: pharyngeal swallow was strong albeit delayed,  oral residue of nectar prematurely spilled into pharynx without  pt sensation - he required verbal cue to swallow   Cervical Esophageal Phase    GO    Cervical Esophageal Phase Cervical Esophageal Phase: Burlene Arnt, MS [facility] SLP [phone]     Medications: . antiseptic oral rinse  15 mL Mouth Rinse q12n4p  . chlorhexidine  15 mL Mouth Rinse BID  . feeding supplement  30 mL Per Tube Q1500  . lip balm  1 application Topical BID  . pantoprazole (PROTONIX) IV  40 mg Intravenous Q12H  . potassium chloride  40 mEq Per Tube Once  . warfarin  5 mg Per Tube ONCE-1800  . Warfarin - Pharmacist Dosing Inpatient   Does not apply q1800    Assessment/Plan Pneumoperitoneum; s/p CVA with PEG placement at [facility] 2 weeks ago.  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 It is all contained. No leak. No obstruction.  s/p PEG tube with gastrojejunal fistula.  GI bleed with BRB per rectum  On chronic coumadin for DVT supratherapeutic INR  Fever with UTI  Hypertension  Aphasia/dysphagia    Plan:  Stable at this point no surgery recommendations currently.       LOS: 5 days    Starletta Houchin 05/05/2013

## 2013-05-05 NOTE — Progress Notes (Signed)
NUTRITION FOLLOW-UP  INTERVENTION: Continue Osmolite 1.5 at 50 ml/hr and 30 ml Prostat daily via tube. This regimen provides: 1900 kcal, 91 grams protein, 914 ml free water. Once IVF discontinued, recommend resuming free water flushes of 200 ml free water via PEG 5 times daily. This will provide an additional 1000 ml free water daily. RD to continue to follow nutrition care plan.  *note that pt was previously on bolus feedings, pt is not appropriate to resume bolus feedings given location of end of tube tip in jejunum*  NUTRITION DIAGNOSIS: Inadequate oral intake related to inability to eat as evidenced by NPO status, need for PEG feedings for nutrition. Ongoing.  Goal: Intake to meet at least 90% of estimated needs. Met.  Monitor:  weight trends, lab trends, I/O's, enteral nutrition resumption and tolerance  ASSESSMENT: PMHx significant for CVA, PEG placement x 2 weeks ago. Pt is from SNF. Admitted with bloody stools and fever. Work-up reveals pneumoperitoneum.  The feeding tube was evaluated on admission and was found to be located in a loop of jejunum without leakage of contrast. Surgery recommending resumption of PEG for feedings per 9/21 note. Pt has been tolerating enteral feedings at this time; confirmed with RN.  Underwent MBSS on 9/24 with recommendations for NPO status and ice chips.  Per chart review, PTA, pt was receiving 1 can of Osmolite 1.5, 5 times daily with 100 ml free water flushes before and after each feeding. This regimen provides 1775 kcal, 75 grams protein, 1905 ml free water daily. 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 Given this location, pt is not appropriate to resume bolus feedings, will continue continuous feedings.  Pt is at nutrition risk given recent CVA and possible prior weight loss.   Labs reviewed, potassium is now WNL. Sodium is 137.  Height: Ht Readings from Last  1 Encounters:  05/01/13 5\' 11"  (1.803 m)    Weight: Wt Readings from Last 1 Encounters:  05/04/13 210 lb 5.1 oz (95.4 kg)  Admit wt 184 lb - ? Accuracy of weight  BMI:  Body mass index is 29.35 kg/(m^2). Overweight.  Estimated Nutritional Needs: Kcal: 1750 - 1950 Protein: 83 - 99 g  Fluid: 1.8 - 2 liters daily  Skin: MASD to buttocks  Diet Order: NPO  EDUCATION NEEDS: -No education needs identified at this time   Intake/Output Summary (Last 24 hours) at 05/05/13 0912 Last data filed at 05/05/13 0552  Gross per 24 hour  Intake 1846.83 ml  Output   1175 ml  Net 671.83 ml    Last BM: 9/24  Labs:   Recent Labs Lab 05/01/13 1641 05/02/13 0440  05/03/13 0210 05/04/13 0425 05/05/13 0428  NA 151* 145  < > 139 138 137  K 3.4* 2.9*  < > 3.2* 3.3* 3.6  CL 114* 110  < > 105 105 106  CO2 29 29  < > 27 24 26   BUN 16 15  < > 12 13 11   CREATININE 0.82 0.81  < > 0.84 0.77 0.72  CALCIUM 9.6 9.2  < > 9.3 8.9 8.6  MG  --  2.3  --   --  2.0  --   GLUCOSE 101* 131*  < > 97 152* 133*  < > = values in this interval not displayed.  CBG (last 3)   Recent Labs  05/05/13 0007 05/05/13 0412 05/05/13 0743  GLUCAP 125* 137* 131*  Scheduled Meds: . antiseptic oral rinse  15 mL Mouth Rinse q12n4p  . chlorhexidine  15 mL Mouth Rinse BID  . feeding supplement  30 mL Per Tube Q1500  . lip balm  1 application Topical BID  . pantoprazole (PROTONIX) IV  40 mg Intravenous Q12H  . potassium chloride  40 mEq Per Tube Once  . warfarin  5 mg Per Tube ONCE-1800  . Warfarin - Pharmacist Dosing Inpatient   Does not apply q1800    Continuous Infusions: . dextrose 5 % with kcl 10 mL/hr at 05/04/13 1711  . feeding supplement (OSMOLITE 1.5 CAL) 1,000 mL (05/04/13 0957)   Jarold Motto MS, RD, LDN Pager: [phone] After-hours pager: [phone]

## 2013-05-05 NOTE — Progress Notes (Signed)
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 Personal belongings given to [facility] staff. I personally called his brother to let him know that the pt was en route to the facility.

## 2013-05-07 LAB — CULTURE, BLOOD (ROUTINE X 2)
Culture: NO GROWTH
Culture: NO GROWTH

## 2013-05-13 ENCOUNTER — Emergency Department (HOSPITAL_COMMUNITY)
Admission: EM | Admit: 2013-05-13 | Discharge: 2013-05-13 | Disposition: A | Discharge status: Skilled Nursing Facility | Payer: BC Managed Care – PPO | Attending: Emergency Medicine | Admitting: Emergency Medicine

## 2013-05-13 ENCOUNTER — Encounter (HOSPITAL_COMMUNITY): Payer: Self-pay | Admitting: Emergency Medicine

## 2013-05-13 DIAGNOSIS — Z86718 Personal history of other venous thrombosis and embolism: Secondary | ICD-10-CM | POA: Insufficient documentation

## 2013-05-13 DIAGNOSIS — F329 Major depressive disorder, single episode, unspecified: Secondary | ICD-10-CM | POA: Insufficient documentation

## 2013-05-13 DIAGNOSIS — R319 Hematuria, unspecified: Secondary | ICD-10-CM

## 2013-05-13 DIAGNOSIS — Z7901 Long term (current) use of anticoagulants: Secondary | ICD-10-CM | POA: Insufficient documentation

## 2013-05-13 DIAGNOSIS — Z79899 Other long term (current) drug therapy: Secondary | ICD-10-CM | POA: Insufficient documentation

## 2013-05-13 DIAGNOSIS — I10 Essential (primary) hypertension: Secondary | ICD-10-CM | POA: Insufficient documentation

## 2013-05-13 DIAGNOSIS — Z8673 Personal history of transient ischemic attack (TIA), and cerebral infarction without residual deficits: Secondary | ICD-10-CM | POA: Insufficient documentation

## 2013-05-13 DIAGNOSIS — F3289 Other specified depressive episodes: Secondary | ICD-10-CM | POA: Insufficient documentation

## 2013-05-13 DIAGNOSIS — D689 Coagulation defect, unspecified: Secondary | ICD-10-CM | POA: Insufficient documentation

## 2013-05-13 HISTORY — DX: Benign prostatic hyperplasia without lower urinary tract symptoms: N40.0

## 2013-05-13 LAB — PROTIME-INR
INR: 2.33 — ABNORMAL HIGH (ref 0.00–1.49)
Prothrombin Time: 24.8 seconds — ABNORMAL HIGH (ref 11.6–15.2)

## 2013-05-13 LAB — URINE MICROSCOPIC-ADD ON

## 2013-05-13 LAB — URINALYSIS, ROUTINE W REFLEX MICROSCOPIC
Bilirubin Urine: NEGATIVE
Nitrite: NEGATIVE
Protein, ur: 100 mg/dL — AB
Specific Gravity, Urine: 1.028 (ref 1.005–1.030)
Urobilinogen, UA: 1 mg/dL (ref 0.0–1.0)
pH: 7 (ref 5.0–8.0)

## 2013-05-13 LAB — CBC WITH DIFFERENTIAL/PLATELET
Eosinophils Absolute: 0.1 10*3/uL (ref 0.0–0.7)
Lymphocytes Relative: 43 % (ref 12–46)
Lymphs Abs: 1.8 10*3/uL (ref 0.7–4.0)
MCH: 31.2 pg (ref 26.0–34.0)
Neutrophils Relative %: 46 % (ref 43–77)
Platelets: 342 10*3/uL (ref 150–400)
RBC: 3.4 MIL/uL — ABNORMAL LOW (ref 4.22–5.81)
WBC: 4.2 10*3/uL (ref 4.0–10.5)

## 2013-05-13 LAB — BASIC METABOLIC PANEL
CO2: 29 mEq/L (ref 19–32)
GFR calc non Af Amer: >90 mL/min (ref >90)
Glucose, Bld: 85 mg/dL (ref 70–99)
Potassium: 3.9 mEq/L (ref 3.5–5.1)
Sodium: 141 mEq/L (ref 135–145)

## 2013-05-13 NOTE — ED Notes (Signed)
Bed: UJ81 Expected date:  Expected time:  Means of arrival:  Comments: EMS-hematuria

## 2013-05-13 NOTE — ED Notes (Signed)
Pt from [facility]. Pt complains of hematuria for past 24 hours. Pt has foley, last replaced a week and a half ago. Hx of enlarged prostate and on blood thinners for past CVA. Pt has blood in drainage bag. Pt denies urgency.

## 2013-05-13 NOTE — ED Notes (Signed)
Not enough urine in new drainage bag, will recheck in a few minutes

## 2013-05-13 NOTE — ED Provider Notes (Signed)
CSN: [id number]     Arrival date & time 05/13/13  1535 History   First MD Initiated Contact with Patient 05/13/13 1555     Chief Complaint  Patient presents with  . Hematuria   (Consider location/radiation/quality/duration/timing/severity/associated sxs/prior Treatment) HPI This 57 year old male has a history of recent stroke with right-sided hemiparesis, PEG tube, DVT, dysphagia, on Coumadin, also recent GI bleed and pneumoperitoneum, now presents with 2 days of gross hematuria chronic indwelling Foley catheter still draining normally, no sensation of incomplete emptying, no dysuria, no fever, no chest pain shortness breath or abdominal pain, he is minimal a slight tenderness to his PEG tube insertion site without surrounding erythema or purulent drainage, no new weakness or numbness change in mental status or other concerns. No treatment prior to arrival. Past Medical History  Diagnosis Date  . Dysphagia   . DVT (deep venous thrombosis)   . Hypertension   . Depression   . 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 Enlarged prostate    Past Surgical History  Procedure Laterality Date  . Esophagoscopy w/ percutaneous gastrostomy tube placement  04/12/2013    Dr Murrell Converse, [facility]   History reviewed. No pertinent family history. History  Substance Use Topics  . Smoking status: Never Smoker   . Smokeless tobacco: Not on file  . Alcohol Use: No    Review of Systems 10 Systems reviewed and are negative for acute change except as noted in the HPI. Allergies  Review of patient's allergies indicates no known allergies.  Home Medications   Current Outpatient Rx  Name  Route  Sig  Dispense  Refill  . acetaminophen (TYLENOL) 160 MG/5ML liquid   Oral   Take 640 mg by mouth every 4 (four) hours as needed for fever.          [name] atorvastatin (LIPITOR) 40 MG  tablet   PEG Tube   40 mg by PEG Tube route daily.         . Nutritional Supplements (FEEDING SUPPLEMENT, OSMOLITE 1.5 CAL,) LIQD   Per Tube   Place 1,000 mLs into feeding tube continuous.      0   . QUEtiapine (SEROQUEL) 25 MG tablet   PEG Tube   25 mg by PEG Tube route at bedtime.         . sertraline (ZOLOFT) 25 MG tablet   PEG Tube   25 mg by PEG Tube route daily.         [name] warfarin (COUMADIN) 5 MG tablet   PEG Tube   5 mg by PEG Tube route daily.          BP 105/64  Pulse 84  Temp(Src) 99.7 F (37.6 C) (Oral)  Resp 16  SpO2 95% Physical Exam  Nursing note and vitals reviewed. Constitutional:  Awake, alert, nontoxic appearance.  HENT:  Head: Atraumatic.  Eyes: Right eye exhibits no discharge. Left eye exhibits no discharge.  Neck: Neck supple.  Cardiovascular: Normal rate and regular rhythm.   No murmur heard. Pulmonary/Chest: Effort normal and breath sounds normal. No respiratory distress. He has no wheezes. He has no rales. He exhibits no tenderness.  Abdominal: Soft. Bowel sounds are normal. He exhibits no distension and no mass. There is no tenderness. There is no rebound and no guarding.  PEG tube in place without surrounding erythema or purulent drainage;  Foley catheter in place draining gross hematuria; bedside bladder scan performed by nursing less than 40ml seen  Musculoskeletal: He exhibits no tenderness.  Baseline ROM, no obvious new focal weakness. Baseline right hemiparesis  Neurological: He is alert.  Mental status and motor strength appears baseline for patient and situation. Baseline right hemiparesis   Skin: No rash noted.  Psychiatric: He has a normal mood and affect.    ED Course  Procedures (including critical care time) Pt stable in ED with no significant deterioration in condition.Patient / Family / Caregiver informed of clinical course, understand medical decision-making process, and agree with plan.Since Hgb improved and Foley  draining, feel OutPt f/u reasonable. Labs Review Labs Reviewed  URINALYSIS, ROUTINE W REFLEX MICROSCOPIC - Abnormal; Notable for the following:    Color, Urine AMBER (*)    Hgb urine dipstick LARGE (*)    Protein, ur 100 (*)    Leukocytes, UA MODERATE (*)    All other components within normal limits  CBC WITH DIFFERENTIAL - Abnormal; Notable for the following:    RBC 3.40 (*)    Hemoglobin 10.6 (*)    HCT 33.0 (*)    All other components within normal limits  PROTIME-INR - Abnormal; Notable for the following:    Prothrombin Time 24.8 (*)    INR 2.33 (*)    All other components within normal limits  BASIC METABOLIC PANEL  URINE MICROSCOPIC-ADD ON   Imaging Review No results found.  MDM   1. Hematuria   2. Warfarin-induced coagulopathy, sequela    I doubt any other EMC precluding discharge at this time including, but not necessarily limited to the following:sepsis, hemodynamic instability.    Hurman Horn, MD 05/14/13 [phone]

## 2013-06-13 ENCOUNTER — Other Ambulatory Visit (HOSPITAL_COMMUNITY): Payer: Self-pay | Admitting: Internal Medicine

## 2013-06-13 DIAGNOSIS — R1310 Dysphagia, unspecified: Secondary | ICD-10-CM

## 2013-06-17 ENCOUNTER — Ambulatory Visit (HOSPITAL_COMMUNITY)
Admission: RE | Admit: 2013-06-17 | Discharge: 2013-06-17 | Disposition: A | Discharge status: Home / Self Care | Payer: BC Managed Care – PPO | Source: Ambulatory Visit | Attending: Internal Medicine | Admitting: Internal Medicine

## 2013-06-17 DIAGNOSIS — R1310 Dysphagia, unspecified: Secondary | ICD-10-CM

## 2013-06-17 NOTE — Procedures (Signed)
Objective Swallowing Evaluation: Modified Barium Swallowing Study  Patient Details  Name: [name] [name] MRN: [id number] Date of Birth: Jun 30, 1956  Today's Date: 06/17/2013 Time: 4401-0272 SLP Time Calculation (min): 41 min  Past Medical History:  Past Medical History  Diagnosis Date  . Dysphagia   . DVT (deep venous thrombosis)   . Hypertension   . Depression   . 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 Enlarged prostate    Past Surgical History:  Past Surgical History  Procedure Laterality Date  . Esophagoscopy w/ percutaneous gastrostomy tube placement  04/12/2013    Dr Murrell Converse, [facility]   HPI:  57 yo male referred for outpt MBS to assess readiness for po intake.  Pt is s/p left medullary CVA, left more than right pons extending at leve of cerebral peduncles resulting in aphasia and severe dysphagia, respiratory compromise- 04/01/2013.  Pt required PEG for nutrition 04/18/2013 when he was at [facility].  Pt recently admitted to hospital with pneumoperitoneum with + fistula and received a swallow study during that hospital stay on 04/30/13.  Recommendations at that time were for pt to have therapuetic feeds of soft/thin with SLP conducting dry swallows and intermittent throat clearing.  Pt has been consuming ice chips with SLP with clear voice in 9/10 trials consistently.       Assessment / Plan / Recommendation Clinical Impression  Dysphagia Diagnosis: Mild oral phase dysphagia;Mild pharyngeal phase dysphagia Clinical impression: Pt presents with mild oropharyngeal sensorimotor based dysphagia with only trace penetration/aspiration of thin liquid- even when challenged to consume liquids sequentially.  Trace penetrates cleared with cued throat clearing.  Oral transiting was more timely compared to previous testing and pt conducting double swallows with each boluse  approx 90% of opportunities which aids his airway protection.   Pt pools bolues in pharynx - at pyriform sinus prior to reflexive swallow triggering, however swallow is strong without pharyngeal stasis.    Rec consider initiating diet with very strict precautions of soft/thin if SNF SLP agrees.  Using teach back and video monitor, SLP reinforced effective compensation strategies.  Pt is impulsive and loquacious requiring cues to cease talking during testing which may impact his airway protection with po.  Also recommend to continue exercises addressing oral tongue base and palatoglossal musculature and improving timeliness of swallow.  Further encouraged pt to conduct dry swallows throughout the day to keep oral cavity clear of secretions.      Treatment Recommendation    Follow up SLP at SNF   Diet Recommendation Dysphagia 3 (Mechanical Soft);Thin liquid with strict precautions  Liquid Administration via: Cup Medication Administration: Whole meds with puree (consider whole with pudding) Supervision: Patient able to self feed Compensations: Slow rate;Small sips/bites;Clear throat after each swallow;Hard cough after swallow;Multiple dry swallows after each bite/sip Postural Changes and/or Swallow Maneuvers: Seated upright 90 degrees;Upright 30-60 min after meal    Other  Recommendations Oral Care Recommendations: Oral care BID   Follow Up Recommendations  Skilled Nursing facility           SLP Swallow Goals     General Date of Onset: 06/17/13 HPI: 57 yo male referred for outpt MBS to assess readiness for po intake.  Pt is s/p left medullary CVA, left more than right pons extending at leve of cerebral peduncles resulting in aphasia and severe dysphagia, respiratory compromise- 04/01/2013.  Pt required  PEG for nutrition 04/18/2013 when he was at [facility].  Pt recently admitted to hospital with pneumoperitoneum with + fistula and received a swallow study during that hospital stay on 04/30/13.   Recommendations at that time were for pt to have therapuetic feeds of soft/thin with SLP conducting dry swallows and intermittent throat clearing.  Pt has been consuming ice chips with SLP with clear voice in 9/10 trials consistently.   Type of Study: Modified Barium Swallowing Study Reason for Referral: Objectively evaluate swallowing function Diet Prior to this Study: NPO;PEG tube (ice chips with SLP) Temperature Spikes Noted: No Respiratory Status: Room air History of Recent Intubation: No Behavior/Cognition: Alert;Cooperative;Pleasant mood (pt is loquacious) Oral Cavity - Dentition: Adequate natural dentition Oral Motor / Sensory Function:  (dysarthric, but improved from last MBS) Self-Feeding Abilities: Able to feed self Patient Positioning: Upright in chair Baseline Vocal Quality: Clear Volitional Cough: Weak Volitional Swallow: Able to elicit Anatomy: Within functional limits Pharyngeal Secretions: Standing secretions in (comment) (oral cavity, cleared with cued swallow)    Reason for Referral Objectively evaluate swallowing function   Oral Phase Oral Preparation/Oral Phase Oral Phase: Impaired Oral - Nectar Oral - Nectar Teaspoon: Weak lingual manipulation;Piecemeal swallowing Oral - Nectar Cup: Weak lingual manipulation;Piecemeal swallowing Oral - Thin Oral - Thin Teaspoon: Weak lingual manipulation;Piecemeal swallowing Oral - Thin Cup: Weak lingual manipulation;Piecemeal swallowing Oral - Thin Straw: Weak lingual manipulation;Piecemeal swallowing Oral - Solids Oral - Puree: Weak lingual manipulation;Piecemeal swallowing Oral - Regular: Weak lingual manipulation;Piecemeal swallowing Oral Phase - Comment Oral Phase - Comment: SLP cued and pt controlled dry swallow aided oral clearance   Pharyngeal Phase Pharyngeal Phase Pharyngeal Phase: Impaired Pharyngeal - Nectar Pharyngeal - Nectar Teaspoon: Premature spillage to pyriform sinuses Pharyngeal - Nectar Cup: Premature  spillage to pyriform sinuses Pharyngeal - Thin Pharyngeal - Thin Teaspoon: Premature spillage to pyriform sinuses Pharyngeal - Thin Cup: Premature spillage to pyriform sinuses;Penetration/Aspiration during swallow Penetration/Aspiration details (thin cup): Material enters airway, passes BELOW cords without attempt by patient to eject out (silent aspiration) Pharyngeal - Thin Straw: Premature spillage to pyriform sinuses Pharyngeal - Solids Pharyngeal - Puree: Premature spillage to valleculae;Premature spillage to pyriform sinuses Pharyngeal - Regular: Premature spillage to valleculae Pharyngeal Phase - Comment Pharyngeal Comment: TRACE aspiration only: most notably when pt was talking, chin tuck posture did not prevent penetration, small single sips result in trace intermittent laryngeal penetration and cued throat clear or continued swallows effectively removed penetrates  Cervical Esophageal Phase    GO    Cervical Esophageal Phase Cervical Esophageal Phase: WFL (appeared clear upon esophageal sweep, no radiologist present to confirm)    Functional Assessment Tool Used: mbs, clinical judgement Functional Limitations: Swallowing Swallow Current Status (G6440): At least 20 percent but less than 40 percent impaired, limited or restricted Swallow Goal Status [phone]): At least 20 percent but less than 40 percent impaired, limited or restricted Swallow Discharge Status [phone]): At least 20 percent but less than 40 percent impaired, limited or restricted    Donavan Burnet, MS [facility] SLP [phone]

## 2014-10-01 IMAGING — CT CT MAXILLOFACIAL W/O CM
5 of 9 series · 16 of 47 positions shown, 18 images · non-contrast
Comparison: None.

CLINICAL DATA: 57-year-old male status post fall with blunt trauma,
hematoma, laceration.

EXAM:
CT HEAD WITHOUT CONTRAST
CT MAXILLOFACIAL WITHOUT CONTRAST
CT CERVICAL SPINE WITHOUT CONTRAST
TECHNIQUE: Multidetector CT imaging of the head, cervical spine, and
maxillofacial structures were performed using the standard protocol
without intravenous contrast. Multiplanar CT image reconstructions
of the cervical spine and maxillofacial structures were also
generated.

[Series 3: head w/o bone · axial · non-contrast · 0.46mm/px · z∈[+284,+322]mm · 2 of 61 slices shown]
[im 16/61  bone]
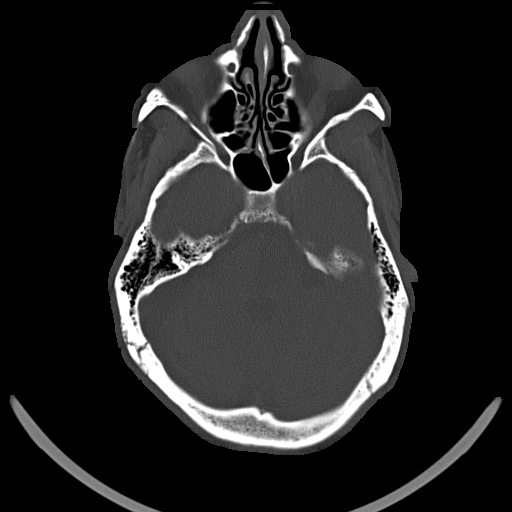
[im 31/61  bone]
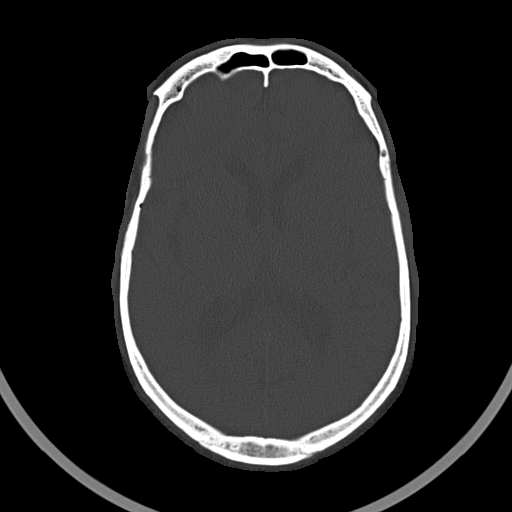

[Series 4: facial bones · axial · 0.39mm/px · z∈[+207,+309]mm · 4 of 87 slices shown]
[im 18/87  bone]
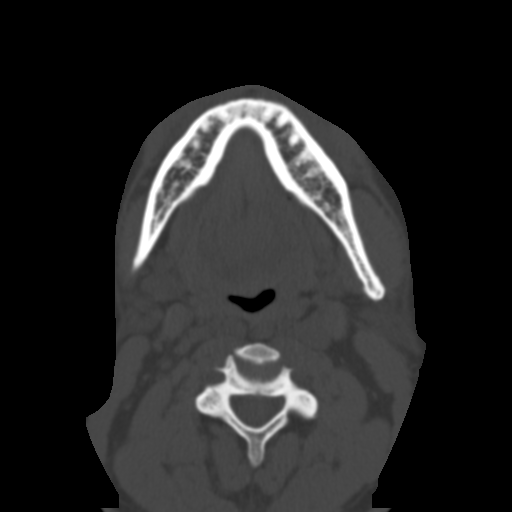
[im 35/87  bone]
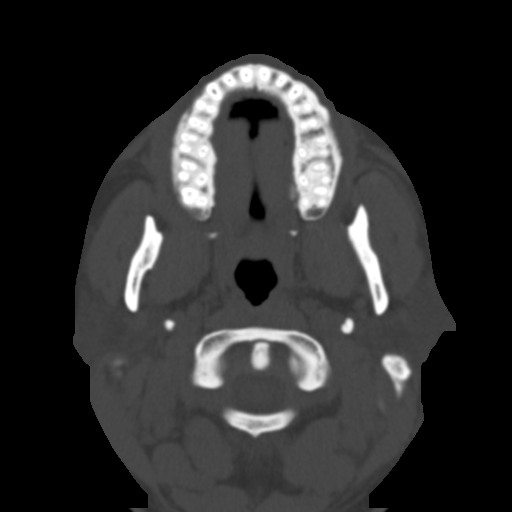
[im 52/87  bone]
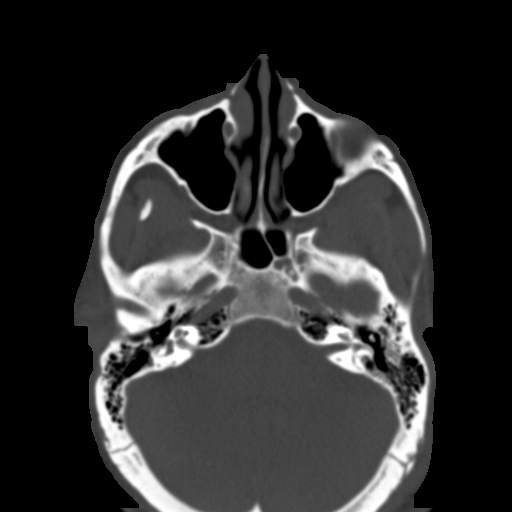
[im 69/87  bone]
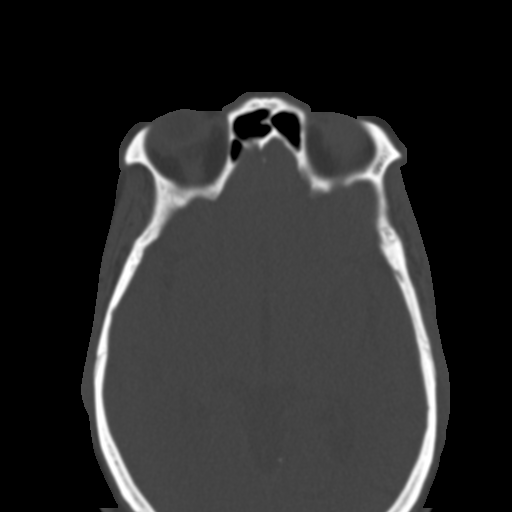

[orthogs · axial · 0.34mm/px · z∈[+93,+209]mm · 7 of 116 slices shown, 9 images]
[im 15/116  brain]
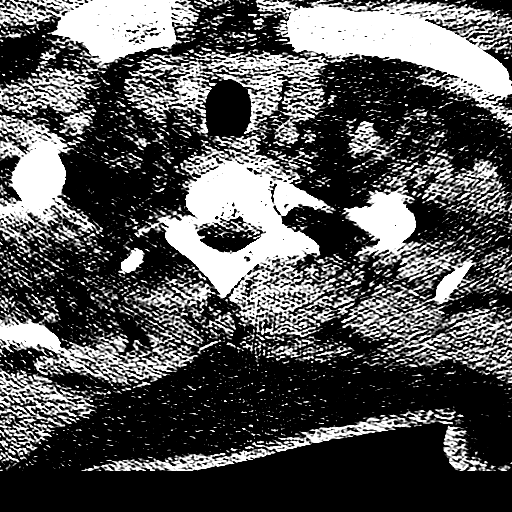
[im 15/116  bone]
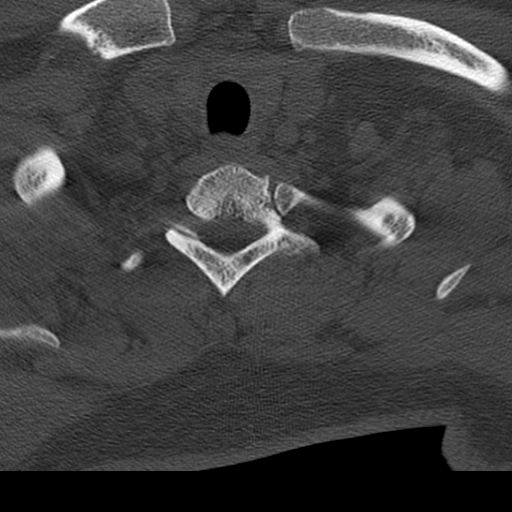
[im 29/116  bone]
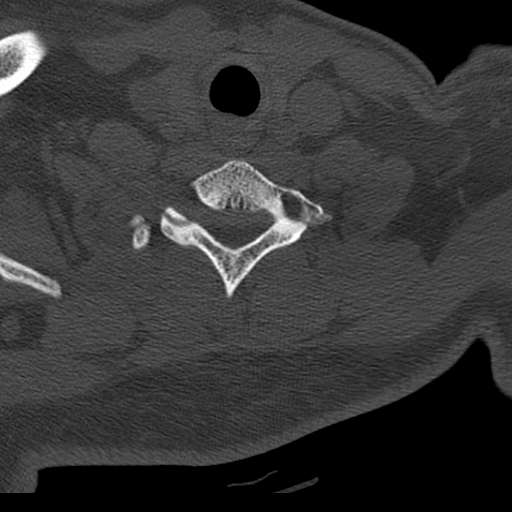
[im 44/116  bone]
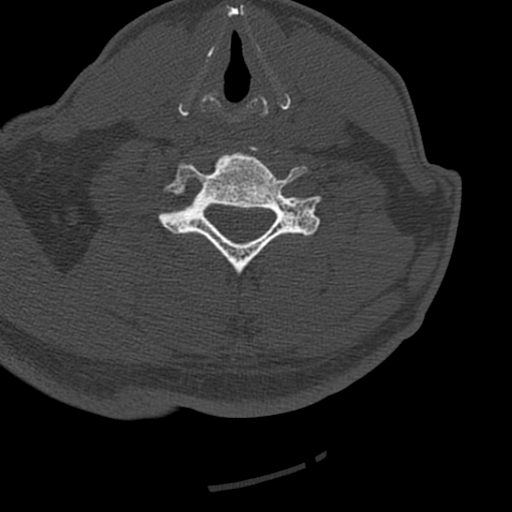
[im 58/116  bone]
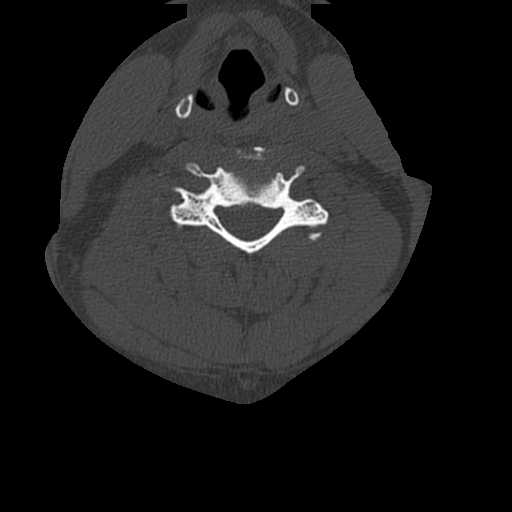
[im 72/116  brain]
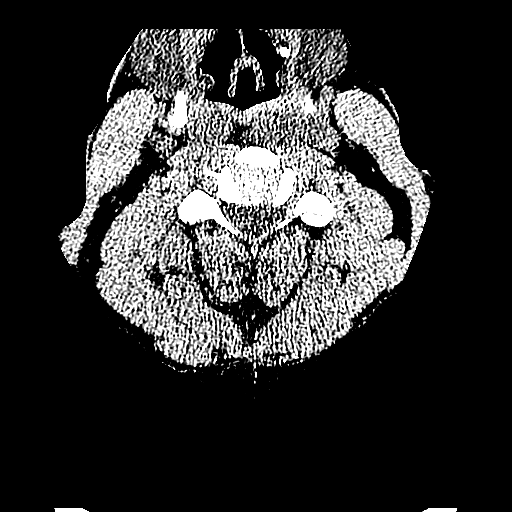
[im 72/116  bone]
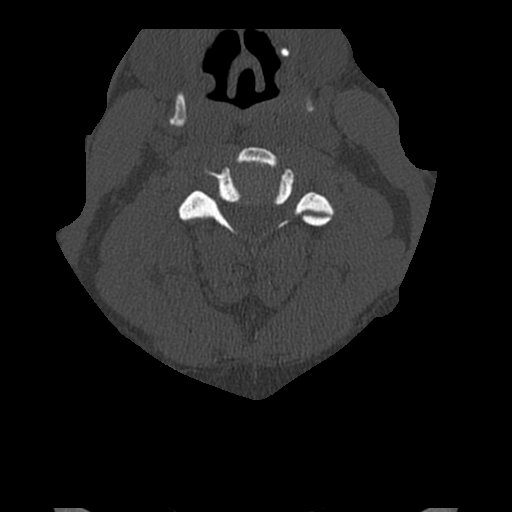
[im 87/116  bone]
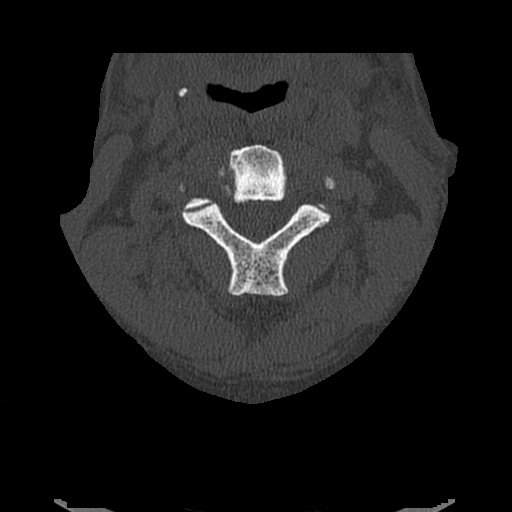
[im 101/116  bone]
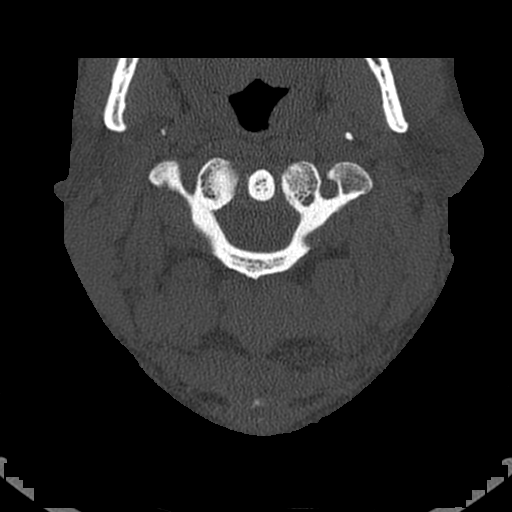

[mpr, sagittal, sagittal · sagittal · 0.42mm/px · 1 of 66 slices shown]
[im 33/66  bone]
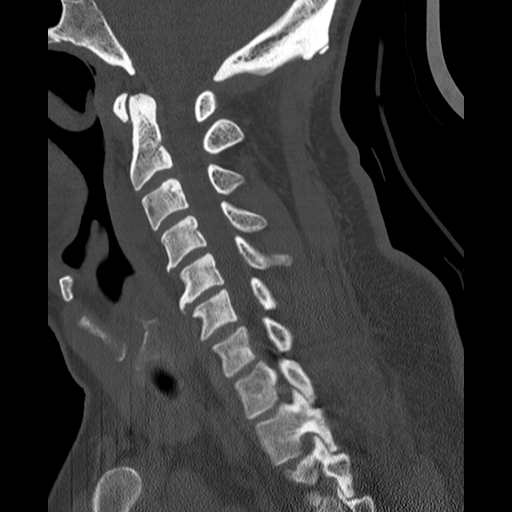

[coronal c spine · coronal · 0.42mm/px · 2 of 41 slices shown]
[im 14/41  bone]
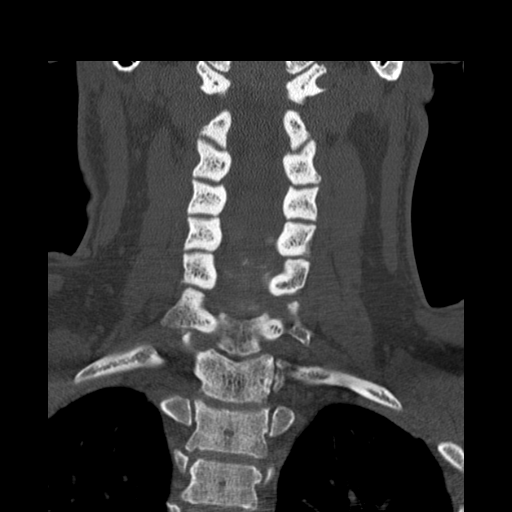
[im 27/41  bone]
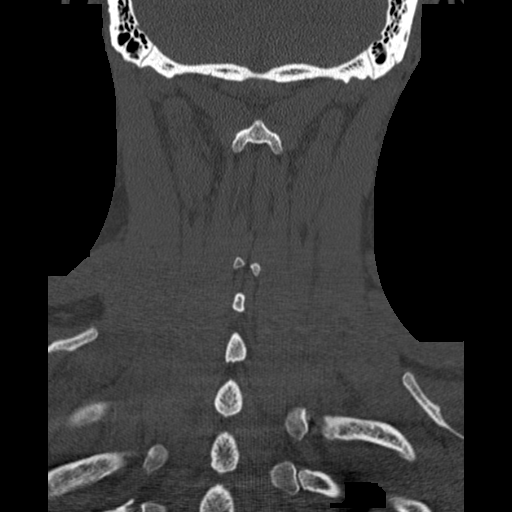

[16 of 47 positions shown; findings below may reference images not displayed]

FINDINGS: CT HEAD FINDINGS

Right forehead scalp hematoma measuring up to 9 mm in thickness.
Right frontal bone intact. Superior scalp soft tissues within normal
limits. No calvarium fracture. Mastoids are clear.

Confluent and profound hypodensity in the left brainstem (series 2,
image 8). Elsewhere posterior fossa gray-white matter
differentiation within normal limits. No midline shift, mass effect,
or evidence of intracranial mass lesion. No ventriculomegaly. No
acute intracranial hemorrhage identified. No evidence of cortically
based acute infarction identified. No suspicious intracranial
vascular hyperdensity.

CT MAXILLOFACIAL FINDINGS

Right periorbital soft tissue injury contiguous with that at the
right forehead. Right globe intact. Bilateral intraorbital soft
tissues are within normal limits.

Visible deep soft tissue spaces of the face and neck remarkable for
bilateral retropharyngeal carotid arteries. Small volume retained
secretions in the pharynx.

Minor paranasal sinus mucosal thickening. Chronic appearing nasal
process of the maxilla fractures. Mandible intact. No orbital wall
fracture. No acute facial fracture.

CT CERVICAL SPINE FINDINGS

Straightening of cervical lordosis. Visualized skull base is intact.
No atlanto-occipital dissociation. Cervicothoracic junction
alignment is within normal limits. Bilateral posterior element
alignment is within normal limits. Chronic disc and endplate
degeneration most pronounced at C5-C6. No acute cervical fracture
identified.

Negative lung apices. Visible upper thoracic levels appear intact.
Visualized paraspinal soft tissues are within normal limits.
Retropharyngeal course of both carotid arteries at the upper
cervical spine level.
IMPRESSION: CT HEAD IMPRESSION

1. No acute traumatic injury to the brain. Chronic left brainstem
infarct.

2.  Forehead scalp hematoma without underlying fracture.

CT MAXILLOFACIAL IMPRESSION

Right periorbital soft tissue injury. No acute facial fracture
identified.

CT CERVICAL SPINE IMPRESSION

No acute fracture or listhesis identified in the cervical spine.
Ligamentous injury is not excluded.

## 2014-10-11 ENCOUNTER — Ambulatory Visit (INDEPENDENT_AMBULATORY_CARE_PROVIDER_SITE_OTHER): Payer: BLUE CROSS/BLUE SHIELD | Admitting: Neurology

## 2014-10-11 ENCOUNTER — Encounter: Payer: Self-pay | Admitting: Neurology

## 2014-10-11 ENCOUNTER — Telehealth: Payer: Self-pay | Admitting: *Deleted

## 2014-10-11 VITALS — BP 137/84 | HR 79 | Ht 71.0 in | Wt 212.0 lb

## 2014-10-11 DIAGNOSIS — G4733 Obstructive sleep apnea (adult) (pediatric): Secondary | ICD-10-CM

## 2014-10-11 DIAGNOSIS — R471 Dysarthria and anarthria: Secondary | ICD-10-CM

## 2014-10-11 DIAGNOSIS — I639 Cerebral infarction, unspecified: Secondary | ICD-10-CM

## 2014-10-11 DIAGNOSIS — G8110 Spastic hemiplegia affecting unspecified side: Secondary | ICD-10-CM

## 2014-10-11 DIAGNOSIS — G8190 Hemiplegia, unspecified affecting unspecified side: Secondary | ICD-10-CM

## 2014-10-11 DIAGNOSIS — G4719 Other hypersomnia: Secondary | ICD-10-CM

## 2014-10-11 NOTE — Patient Instructions (Signed)
Overall you are doing fairly well but I do want to suggest a few things today:   Remember to drink plenty of fluid, eat healthy meals and do not skip any meals. Try to eat protein with a every meal and eat a healthy snack such as fruit or nuts in between meals. Try to keep a regular sleep-wake schedule and try to exercise daily, particularly in the form of walking, 20-30 minutes a day, if you can.   As far as your medications are concerned, I would like to suggest: continue current medications. Recommend Botox for spasticity.  As far as diagnostic testing: none at this time, [facility].   I would like to see you back in 6 months, sooner if we need to. Please call us with any interim questions, concerns, problems, updates or refill requests.   Please also call us for any test results so we can go over those with you on the phone.  My clinical assistant and will answer any of your questions and relay your messages to me and also relay most of my messages to you.   Our phone number is [phone]. We also have an after hours call service for urgent matters and there is a physician on-call for urgent questions. For any emergencies you know to call 911 or go to the nearest emergency room

## 2014-10-11 NOTE — Progress Notes (Addendum)
[facility]    Provider:  Dr Lucia Gaskins Referring Provider: Renaye Rakers, MD Primary Care Physician:  Geraldo Pitter, MD  CC:  Stroke  HPI:  [name] [name] is a 59 y.o. male here as a referral from Dr. Parke Simmers for stroke. PMHx memory loss, stroke, hypertension 59 year old male with history of hypertension, history of DVT on Coumadin who was admitted to [facility] recently with an acute stroke involving a large area on the left side with residual right hemiplegia and dysphasia was discharged to skilled nursing facility. His brother is here and provides most information, patient is a poor historian. Brother dsays patient had a clot that traveled through his lungs to the posterior circulation and casued the stroke. He had a PEG tube placed in the hospital due to dysphagia. He has no records with him. He was going to [facility] for yearly updates. He still has weakness and some dysarthria. He can barely walk without the cane. He has drooling which is worse. He is in physical therapy. Has been a year since the last speech therapy. He has some vision problems reading close up. 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 He is having frequent awakenings at night for unknown reasons, He lives alone, unclear if snores and is excessively tired during the day. They found him in his truck, they think he was there in the truck for 2-3 days after having the stroke, patient couldn't move and couldn't get out of the truck. He has gained a lot of weight recently.   Reviewed notes, labs and imaging from outside physicians, which showed:  LDL 56 HgbA1c 5.6   CT of the head 04/2013: CT HEAD FINDINGS  Right forehead scalp hematoma measuring up to 9 mm in thickness. Right frontal bone intact. Superior scalp soft tissues within normal limits. No calvarium fracture. Mastoids are clear.  Confluent and profound  hypodensity in the left brainstem (series 2, image 8). Elsewhere posterior fossa gray-white matter differentiation within normal limits. No midline shift, mass effect, or evidence of intracranial mass lesion. No ventriculomegaly. No acute intracranial hemorrhage identified. No evidence of cortically based acute infarction identified. No suspicious intracranial vascular hyperdensity.  Personally reviewed images and agree with findings.   Review of Systems: Patient complains of symptoms per HPI as well as the following symptoms: Insomnia. Pertinent negatives per HPI. All others negative.   History   Social History  . Marital Status: Married    Spouse Name: N/A  . Number of Children: 0  . Years of Education: 12   Occupational History  . umemployed    Social History Main Topics  . Smoking status: Never Smoker   . Smokeless tobacco: Not on file  . Alcohol Use: No  . Drug Use: No  . Sexual Activity: Yes   Other Topics Concern  . Not on file   Social History Narrative   Lives at home by self.    Single.   Caffeine use: daily    Family History  Problem Relation Age of Onset  . High blood pressure    . Diabetes    . Heart Problems      Past Medical History  Diagnosis Date  . Dysphagia   . DVT (deep venous thrombosis)   . Hypertension   . Depression   . 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 Enlarged prostate     Past Surgical History  Procedure Laterality Date  . Esophagoscopy w/ percutaneous gastrostomy tube placement  04/12/2013    Dr Murrell Converse, [facility]    Current Outpatient Prescriptions  Medication Sig Dispense Refill  . atorvastatin (LIPITOR) 40 MG tablet 40 mg by PEG Tube route daily.    [name] doxazosin (CARDURA) 1 MG tablet Take 1 tablet by mouth daily.  0  . finasteride (PROSCAR) 5 MG tablet Take 1 tablet by mouth daily.  0   . Nutritional Supplements (FEEDING SUPPLEMENT, OSMOLITE 1.5 CAL,) LIQD Place 1,000 mLs into feeding tube continuous.  0  . sertraline (ZOLOFT) 25 MG tablet 25 mg by PEG Tube route daily.    [name] warfarin (COUMADIN) 5 MG tablet 5 mg by PEG Tube route daily.    [name] acetaminophen (TYLENOL) 160 MG/5ML liquid Take 640 mg by mouth every 4 (four) hours as needed for fever.     [name] QUEtiapine (SEROQUEL) 25 MG tablet 25 mg by PEG Tube route at bedtime.     No current facility-administered medications for this visit.    Allergies as of 10/11/2014  . (No Known Allergies)    Vitals: BP 137/84 mmHg  Pulse 79  Ht  (1.803 m)  Wt 212 lb (96.163 kg)  BMI 29.58 kg/m2 Last Weight:  Wt Readings from Last 1 Encounters:  10/11/14 212 lb (96.163 kg)   Last Height:   Ht Readings from Last 1 Encounters:  10/11/14  (1.803 m)    Physical exam: Exam: Gen: NAD, well groomed                     CV: RRR, no MRG. No Carotid Bruits. No peripheral edema, warm, nontender Eyes: Conjunctivae clear without exudates or hemorrhage  Neuro: Detailed Neurologic Exam  Speech:    Speech is dysarthric;  Cognition:    The patient is oriented to person    recent and remote memory impaired;     Language not fluent;     Impaired attention, concentration,     fund of knowledge appears impaired Cranial Nerves:    The pupils are equal, round, and reactive to light. Cannot visualize fundi due to patient movements.  Visual fields are full to finger confrontation. Extraocular movements are intact. Trigeminal sensation is intact and the muscles of mastication are normal. The face is slightly asymmetric. The palate elevates in the midline. Hearing appears intact. Voice is normal. Shoulder shrug is normal. The tongue has normal motion without fasciculations.   Coordination:    Cannot perform on the right due to spasticity and weakness  Gait:   Right leg circumduction with flexion at the knee  Motor Observation:     no involuntary movements noted. Tone:    Normal muscle tone.    Posture:    Posture is normal. normal erect    Strength:    Right spastic hemiparesis, right deltoid and hip flexion 2+/5  0 No increase in tone  1 Slight increase in muscle tone, manifested by a catch and release or minimal resistance at the end of the ROM when the affected part(s) is moved in flexion or extension  1+ Slight increase in muscle tone, manifested by a catch, followed by minimal resistance throughout the remainder (less than half) of the ROM  2 More marked increase in muscle tone through most of the  ROM, but affected part(s) easily moved  3 Considerable increase in muscle tone, passive movement difficult  4 Affected part(s) rigid in flexion or extension     Biceps 1+ Flexor Digitorum profundus 1 Flexor Digitorum Sublimis 1 Adductor Pollicis/Flexor Pollicis Longus 1 PT 3 Biceps Femoris 3  Sensation: intact to LT     Reflex Exam:  DTR's:    Brisk on the right Toes:    The toes are up on the right, down on the left    Clonus:    Clonus is absent.      Assessment/Plan:  59 year old male with left-sided stroke, right hemiplegia, HTN, memory difficulties, DVT on coumadin. Imaging showed hypodensity in the left brainstem.    - [facility] - Needs close follow up from pcp for tight control of vascular risk factors such as BP, LDL (goal <70), HgbA1c (goal < 7) - Continue stroke ppx, lipitor - Sleep study, has OSA but has not been using a cpap for many years. - Speech therapy - botox due to spasticity  Addendum: Received records [facility]. 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 He has residual right-sided hemiparesis. He presented initially to an outside facility in August 2014 after being found down in his truck. CT of the head showed subacute left pontine infarct. NIH stroke scale of 30. 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 Patient was taken for a basilar thrombectomy. This did not reestablish flow. 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 He was started on anticoagulation due to the intra-arterial clot as well as a right upper extremity DVT. He has been continued on warfarin since then. INR goal 2-3.  Naomie DeanAntonia Ahern, MD  [facility] [address] [place], [place] [id number]  Phone [phone] Fax [phone]

## 2014-10-11 NOTE — Telephone Encounter (Signed)
[facility] fax[facility] on 10/11/14 requesting records.

## 2014-10-16 ENCOUNTER — Telehealth: Payer: Self-pay | Admitting: Neurology

## 2014-10-16 DIAGNOSIS — I10 Essential (primary) hypertension: Secondary | ICD-10-CM

## 2014-10-16 DIAGNOSIS — G4733 Obstructive sleep apnea (adult) (pediatric): Secondary | ICD-10-CM

## 2014-10-16 DIAGNOSIS — I63111 Cerebral infarction due to embolism of right vertebral artery: Secondary | ICD-10-CM

## 2014-10-16 NOTE — Telephone Encounter (Signed)
Naomie DeanAntonia Ahern, MD refers patient for attended sleep study.  Height: 5'11"  Weight: 212 lb  BMI: 29.58  Past Medical History:  Dysphagia    . DVT (deep venous thrombosis)   . Hypertension   . Depression   . 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 Enlarged prostate            Sleep Symptoms: Excessive daytime sleepiness, obstructive sleep apnea, was on CPAP many years ago no longer has one, frequent awakenings, stroke   Epworth Score: Unable to reach the paient   Medication:  Acetaminophen (Liquid) TYLENOL 160 MG/5ML Take 640 mg by mouth every 4 (four) hours as needed for fever.        Atorvastatin Calcium (Tab) LIPITOR 40 MG 40 mg by PEG Tube route daily.      Doxazosin Mesylate (Tab) CARDURA 1 MG Take 1 tablet by mouth daily.      Finasteride (Tab) PROSCAR 5 MG Take 1 tablet by mouth daily.      Nutritional Supplements (Liquid) feeding supplement (OSMOLITE 1.5 CAL)  Place 1,000 mLs into feeding tube continuous.      QUEtiapine Fumarate (Tab) SEROQUEL 25 MG 25 mg by PEG Tube route at bedtime.      Sertraline HCl (Tab) ZOLOFT 25 MG 25 mg by PEG Tube route daily.      Warfarin Sodium (Tab) COUMADIN 5 MG 5 mg by PEG Tube route daily.         Ins: [employer]   Assessment & Plan: 59 year old male with left-sided stroke, right hemiplegia, HTN, memory difficulties, DVT on coumadin. Imaging showed hypodensity in the left brainstem.    - will [facility] - Needs close follow up from pcp for tight control of vascular risk factors such as BP, LDL (goal <70), HgbA1c (goal < 7) - Continue stroke ppx, lipitor - Sleep study, has OSA but has not been using a cpap for many years. - Speech therapy - botox due to spasticity  Naomie DeanAntonia Ahern, MD  Please review patient information  and submit instructions for scheduling and orders for sleep technologist. Thank you.

## 2014-11-14 ENCOUNTER — Ambulatory Visit: Payer: Self-pay

## 2014-11-17 ENCOUNTER — Ambulatory Visit: Payer: BLUE CROSS/BLUE SHIELD | Attending: Neurology

## 2014-11-17 DIAGNOSIS — I69920 Aphasia following unspecified cerebrovascular disease: Secondary | ICD-10-CM | POA: Diagnosis not present

## 2014-11-17 DIAGNOSIS — R488 Other symbolic dysfunctions: Secondary | ICD-10-CM | POA: Insufficient documentation

## 2014-11-17 DIAGNOSIS — R482 Apraxia: Secondary | ICD-10-CM

## 2014-11-17 DIAGNOSIS — I69320 Aphasia following cerebral infarction: Secondary | ICD-10-CM

## 2014-11-17 NOTE — Therapy (Signed)
[facility] [address] [place], [place], [address] Phone: [phone]   Fax:  [phone]  Speech Language Pathology Evaluation  Patient Details  Name: [name] [name] MRN: [id number] Date of Birth: 08-13-1955 Referring Provider:  Anson Fret, MD  Encounter Date: 11/17/2014      End of Session - 11/17/14 1631    Visit Number 1   Number of Visits 16  possibly 8-12 depending on progress   Date for SLP Re-Evaluation 01/17/15   SLP Start Time 1319   SLP Stop Time  1402   SLP Time Calculation (min) 43 min   Activity Tolerance Patient tolerated treatment well      Past Medical History  Diagnosis Date  . Dysphagia   . DVT (deep venous thrombosis)   . Hypertension   . Depression   . 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 Enlarged prostate     Past Surgical History  Procedure Laterality Date  . Esophagoscopy w/ percutaneous gastrostomy tube placement  04/12/2013    Dr [name] [name], [facility]    There were no vitals filed for this visit.  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 Was in [facility] (SNF) - d/c'd back to home approx one year ago.   Patient is accompained by: Family member  brother            SLP Evaluation [facility] - 11/17/14 1333    SLP Visit Information   SLP Received On 11/17/14   Onset Date August 2014   Medical Diagnosis S/P CVA   Subjective   Patient/Family Stated Goal Improve speech fluidity, reduce drooling frequency   General Information   HPI Pt originally found down in his truck. Mainly lt pontine CVA with rt pontine involvement. Was on PEG tube, now on regular diet. PEG removed approx 8 months ago.   Prior Functional Status    Cognitive/Linguistic Baseline Within functional limits   Vocation On disability   Cognition   Overall Cognitive Status Impaired/Different from baseline   Area of Impairment Memory   Memory Impaired  Pt turned around to ask brother if he was on disability   Memory Impairment --   Auditory Comprehension   Overall Auditory Comprehension Appears within functional limits for tasks assessed   Expression   Primary Mode of Expression Verbal   Verbal Expression   Overall Verbal Expression Impaired   Interfering Components Speech intelligibility   Other Verbal Expression Comments Pt with occasional anomia during conversation   Oral Motor/Sensory Function   Overall Oral Motor/Sensory Function Impaired   Labial ROM Within Functional Limits   Labial Strength Within Functional Limits   Labial Coordination Reduced   Lingual ROM Within Functional Limits   Lingual Symmetry Within Functional Limits   Lingual Strength Reduced Left  lt more than rt   Lingual Coordination Reduced   Facial Strength Reduced  limited change with request for eyes open wide/shut tight   Velum Within Functional Limits   Motor Speech   Overall Motor Speech Impaired   Respiration Impaired   Level of Impairment Conversation   Articulation Impaired   Level of Impairment Sentence   Motor Planning Impaired   Level of Impairment Sentence   Motor  Speech Errors Groping for words;Consistent              SLP Education - 11/17/14 1633    Education provided Yes   Education Details HEP, prognosis (limited progress >12-18 mohths post CVA), need to complete homework routinely, compensation of reduced rate   Person(s) Educated Patient;Caregiver(s)   Methods Explanation   Comprehension Verbalized understanding;Need further instruction          SLP Short Term Goals - 11/17/14 1637    SLP SHORT TERM GOAL #1   Title pt will repeat 5--6 word sentences 80% success with occasional min A   Time 4   Period Weeks    Status New   SLP SHORT TERM GOAL #2   Title pt will complete HEP as directed by SLP   Time 4   Period Weeks   Status New   SLP SHORT TERM GOAL #3   Title pt will exhibit compensations in 7/10 sentence responses with occasional min A   Time 4   Period Weeks   Status New   SLP SHORT TERM GOAL #4   Title pt will complete simple conversation with compensations 70% of the time occasional min A   Time 4   Period Weeks   Status New          SLP Long Term Goals - 11/17/14 1640    SLP LONG TERM GOAL #1   Title pt will complete simple conversation with 70% success and rare min A for compensations   Baseline (possibly no more than 6 weeks for all LTGs depending on progress)   Time 8   Period Weeks   Status New   SLP LONG TERM GOAL #2   Title pt will repeat 6-8 word sentences using compensations 8/10, and rare min A   Time 8   Period Weeks   Status New   SLP LONG TERM GOAL #3   Title pt will demo compensations for anomia with occasional min A in simple conversation   Time 8   Period Weeks   Status New          Plan - 11/17/14 1632    Clinical Impression Statement Pt presents with mod motor speech deficits today c/b dysfluent speech at sentence and conversation levels, as well as some anomia in sentences and conversation. Pt is >18 months post CVA with skilled ST at SNF until approx one year ago. Skilled ST required to maximize pt speech intelligibility   Speech Therapy Frequency 2x / week   Duration --  8 weeks (4-6 weeks likely if limited progress seen)   Treatment/Interventions SLP instruction and feedback;Compensatory strategies;Internal/external aids;Compensatory techniques;Patient/family education;Language facilitation;Functional tasks;Cueing hierarchy   Potential to Achieve Goals Fair   Potential Considerations Severity of impairments;Other (comment)  time post CVA   Consulted and Agree with Plan of Care Patient;Family member/caregiver   Family Member Consulted brother,  [name] [name]        Problem List Patient Active Problem List   Diagnosis Date Noted  . OSA (obstructive sleep apnea) 10/11/2014  . Excessive daytime sleepiness 10/11/2014  . Hemiparesis 10/11/2014  . Dysarthria 10/11/2014  . Pneumoperitoneum 05/01/2013  . Lower GI bleed 05/01/2013  . UTI (urinary tract infection) 05/01/2013  . Supratherapeutic INR 05/01/2013  . Chronic indwelling Foley catheter 05/01/2013  . Jejunostomy tube present (?originally G tube?) 05/01/2013  . Laceration of eyebrow s/p repair 04/26/2013 05/01/2013  . 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 Hemiparesis affecting right side as  late effect of stroke 05/01/2013  . 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 DVT of right axillary vein, acute 05/01/2013  . CVA (cerebral vascular accident) 05/01/2013  . Anemia 05/01/2013  . Dysphagia     Verdie MosherSCHINKE,Deborha Moseley, SLP 11/17/2014, 4:47 PM  [facility] [address] [place], [place], [address] Phone: [phone]   Fax:  [phone]

## 2014-11-17 NOTE — Patient Instructions (Signed)
Repeat one column of words provided via paper handouts x2, once a day.

## 2014-11-21 ENCOUNTER — Encounter: Payer: BLUE CROSS/BLUE SHIELD | Admitting: Speech Pathology

## 2014-11-24 ENCOUNTER — Ambulatory Visit: Payer: BLUE CROSS/BLUE SHIELD

## 2014-11-24 DIAGNOSIS — R488 Other symbolic dysfunctions: Secondary | ICD-10-CM | POA: Diagnosis not present

## 2014-11-24 DIAGNOSIS — R482 Apraxia: Secondary | ICD-10-CM

## 2014-11-24 DIAGNOSIS — I69320 Aphasia following cerebral infarction: Secondary | ICD-10-CM

## 2014-11-24 DIAGNOSIS — R41841 Cognitive communication deficit: Secondary | ICD-10-CM

## 2014-11-24 NOTE — Patient Instructions (Signed)
  Please complete the assigned speech therapy homework with a family member and return it to your next session.

## 2014-11-24 NOTE — Therapy (Signed)
[facility] [address] [place], [place], [address] Phone: [phone]   Fax:  [phone]  Speech Language Pathology Treatment  Patient Details  Name: [name] [name] MRN: [id number] Date of Birth: [birth year] Referring Provider:  Renaye Rakers, MD  Encounter Date: 11/24/2014      End of Session - 11/24/14 1705    Visit Number 2   Number of Visits 16   Date for SLP Re-Evaluation 01/17/15   SLP Start Time 1102   SLP Stop Time  1147   SLP Time Calculation (min) 45 min   Activity Tolerance Patient tolerated treatment well      Past Medical History  Diagnosis Date  . Dysphagia   . DVT (deep venous thrombosis)   . Hypertension   . Depression   . 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 Enlarged prostate     Past Surgical History  Procedure Laterality Date  . Esophagoscopy w/ percutaneous gastrostomy tube placement  04/12/2013    Dr Murrell Converse, [facility]    There were no vitals filed for this visit.  Visit Diagnosis: Aphasia as late effect of cerebrovascular accident - Plan: SLP plan of care cert/re-cert  Apraxia of speech - Plan: SLP plan of care cert/re-cert  Cognitive communication deficit - Plan: SLP plan of care cert/re-cert      Subjective Assessment - 11/24/14 1701    Subjective Pt frequently with off-topic conversation today. Impulsivity as well as evidence of decr'd memory skills and reasoning noted. Family visits pt daily.               ADULT SLP TREATMENT - 11/24/14 1127    General Information   Behavior/Cognition Cooperative;Pleasant mood;Confused   Treatment Provided   Treatment provided Cognitive-Linquistic   Cognitive-Linquistic Treatment   Treatment focused on Dysarthria   Skilled Treatment SLP educated pt re: deficits and how they play a role in decr'd  speech intelligibility. Pt's respiratory muscle trainer is too easy for him - SLP turned resistance to highest and pt reported 3/10 effort/difficulty with both settings. SLP told pt to discontinue use. SLP then guided pt through discussion of compensations for dysarthria, with pt asking off-topic questions throughout. SLP then facilitated practice with slowed rate and pausing between words at the phrase level with pt success at approx 60%. SLP ensured pt's brother attneding with pt today understood target behavior. SLP sent homework home with pt but first ensured brother would be available to assist pt.          SLP Education - 11/24/14 1704    Education provided Yes   Education Details compensations for speech clarity (specifically pausing between words and rate reduction), respiratory muscle training device   Person(s) Educated Patient;Caregiver(s)   Methods Explanation;Demonstration;Verbal cues   Comprehension Verbalized understanding;Returned demonstration;Verbal cues required;Need further instruction          SLP Short Term Goals - 11/24/14 1707    SLP SHORT TERM GOAL #1   Title pt will repeat 5--6 word sentences 80% success with occasional min A   Time 4   Period Weeks   Status On-going   SLP SHORT TERM GOAL #2   Title pt will complete HEP as directed by SLP   Time 4   Period Weeks   Status On-going   SLP SHORT TERM GOAL #3   Title pt will  exhibit compensations in 7/10 sentence responses with occasional min A   Time 4   Period Weeks   Status On-going   SLP SHORT TERM GOAL #4   Title pt will complete simple conversation with compensations 70% of the time occasional min A   Time 4   Period Weeks   Status On-going          SLP Long Term Goals - 11/24/14 1707    SLP LONG TERM GOAL #1   Title pt will complete simple conversation with 70% success and rare min A for compensations   Baseline (possibly no more than 6 weeks for all LTGs depending on progress)   Time 8    Period Weeks   Status On-going   SLP LONG TERM GOAL #2   Title pt will repeat 6-8 word sentences using compensations 8/10, and rare min A   Time 8   Period Weeks   Status On-going   SLP LONG TERM GOAL #3   Title pt will demo compensations for anomia with occasional min A in simple conversation   Time 8   Period Weeks   Status On-going          Plan - 11/24/14 1705    Clinical Impression Statement Pt with much better fluent speech with compensation of pausing between words. Pt also presents with cognitive deficits, however is most concerned about speech deficits thus SLP will cont to work with these.   Speech Therapy Frequency 2x / week   Duration --  8 weeks   Treatment/Interventions SLP instruction and feedback;Compensatory strategies;Internal/external aids;Compensatory techniques;Patient/family education;Language facilitation;Functional tasks;Cueing hierarchy   Potential to Achieve Goals Fair   Potential Considerations Severity of impairments;Other (comment)        Problem List Patient Active Problem List   Diagnosis Date Noted  . OSA (obstructive sleep apnea) 10/11/2014  . Excessive daytime sleepiness 10/11/2014  . Hemiparesis 10/11/2014  . Dysarthria 10/11/2014  . Pneumoperitoneum 05/01/2013  . Lower GI bleed 05/01/2013  . UTI (urinary tract infection) 05/01/2013  . Supratherapeutic INR 05/01/2013  . Chronic indwelling Foley catheter 05/01/2013  . Jejunostomy tube present (?originally G tube?) 05/01/2013  . Laceration of eyebrow s/p repair 04/26/2013 05/01/2013  . 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 Hemiparesis affecting right side as late effect of stroke 05/01/2013  . 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 DVT of right axillary vein, acute 05/01/2013  . CVA (cerebral vascular accident) 05/01/2013  . Anemia 05/01/2013  . Dysphagia     Verdie MosherSCHINKE,Amit Leece, SLP 11/24/2014, 5:10 PM  [facility] [address] [place], [place], [address] Phone: [phone]   Fax:  [phone]

## 2014-11-28 ENCOUNTER — Ambulatory Visit: Payer: BLUE CROSS/BLUE SHIELD

## 2014-11-28 DIAGNOSIS — I69320 Aphasia following cerebral infarction: Secondary | ICD-10-CM

## 2014-11-28 DIAGNOSIS — R41841 Cognitive communication deficit: Secondary | ICD-10-CM

## 2014-11-28 DIAGNOSIS — R488 Other symbolic dysfunctions: Secondary | ICD-10-CM | POA: Diagnosis not present

## 2014-11-28 DIAGNOSIS — R482 Apraxia: Secondary | ICD-10-CM

## 2014-11-28 NOTE — Therapy (Signed)
[facility] [address] [place], [place], [address] Phone: [phone]   Fax:  [phone]  Speech Language Pathology Treatment  Patient Details  Name: [name] [name] MRN: [id number] Date of Birth: 1956-01-14 Referring Provider:  Renaye Rakers, MD  Encounter Date: 11/28/2014      End of Session - 11/28/14 1530    Visit Number 3   Number of Visits 16   Date for SLP Re-Evaluation 01/17/15   SLP Start Time 1448   SLP Stop Time  1530   SLP Time Calculation (min) 42 min   Activity Tolerance Patient tolerated treatment well      Past Medical History  Diagnosis Date  . Dysphagia   . DVT (deep venous thrombosis)   . Hypertension   . Depression   . 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 Enlarged prostate     Past Surgical History  Procedure Laterality Date  . Esophagoscopy w/ percutaneous gastrostomy tube placement  04/12/2013    Dr Murrell Converse, [facility]    There were no vitals filed for this visit.  Visit Diagnosis: Apraxia of speech  Aphasia as late effect of cerebrovascular accident  Cognitive communication deficit      Subjective Assessment - 11/28/14 1450    Subjective Pt walked in with cane today - four point cane. Pt using like single point cane. Pt inquired re: use of cane. SLP encouraged asking PCP re: PT referral.   Patient is accompained by: Family member  brother               ADULT SLP TREATMENT - 11/28/14 1455    General Information   Behavior/Cognition Cooperative;Pleasant mood;Confused   Treatment Provided   Treatment provided Cognitive-Linquistic   Pain Assessment   Pain Assessment No/denies pain   Cognitive-Linquistic Treatment   Treatment focused on Dysarthria;Apraxia;Aphasia   Skilled Treatment Pt with off-topic conversation throughout session.  SLP req'd to redirect pt back to therapy tasks. Pt admitted to SLP that he had not done any homework. SLP reminded pt that he needed to complete homework in order to afford himself the best chance for change. Pt unscrambled sentences with 90% fluent speech. Pt told SLP 3 step  sequences with 40% success with compensations. Pt asking SLP random questions re: CVAs between responses. Similarities and differences: pt with 65% success in reducing rate and achieving fluid/fluent speech.     Assessment / Recommendations / Plan   Plan Continue with current plan of care   Progression Toward Goals   Progression toward goals Progressing toward goals          SLP Education - 11/28/14 1529    Education provided Yes   Education Details compensations, need to complete exercises at home   Person(s) Educated Patient;Caregiver(s)   Methods Explanation;Demonstration   Comprehension Verbalized understanding;Verbal cues required;Need further instruction          SLP Short Term Goals - 11/28/14 1532    SLP SHORT TERM GOAL #1   Title pt will repeat 5--6 word sentences 80% success with occasional min A   Time 3   Period Weeks   Status On-going   SLP SHORT TERM GOAL #2   Title pt will complete HEP as directed by SLP   Time 3   Period Weeks   Status On-going   SLP SHORT TERM GOAL #  3   Title pt will exhibit compensations in 7/10 sentence responses with occasional min A   Time 3   Period Weeks   Status On-going   SLP SHORT TERM GOAL #4   Title pt will complete simple conversation with compensations 70% of the time occasional min A   Time 3   Period Weeks   Status On-going          SLP Long Term Goals - 11/28/14 1532    SLP LONG TERM GOAL #1   Title pt will complete simple conversation with 70% success and rare min A for compensations   Baseline (possibly no more than 6 weeks for all LTGs depending on progress)   Time 7   Period Weeks   Status On-going   SLP LONG TERM GOAL #2   Title pt  will repeat 6-8 word sentences using compensations 8/10, and rare min A   Time 7   Period Weeks   Status On-going   SLP LONG TERM GOAL #3   Title pt will demo compensations for anomia with occasional min A in simple conversation   Time 7   Period Weeks   Status On-going          Plan - 11/28/14 1530    Clinical Impression Statement Pt requires redirection during sessions for remaining on task. Skilled ST necessary to maximize pt's ability with compensations for aphasia/apraxia and mild dysarthria.   Speech Therapy Frequency 2x / week   Duration --  7 weeks   Treatment/Interventions SLP instruction and feedback;Compensatory strategies;Internal/external aids;Compensatory techniques;Patient/family education;Language facilitation;Functional tasks;Cueing hierarchy   Potential to Achieve Goals Fair   Potential Considerations Severity of impairments;Ability to learn/carryover information        Problem List Patient Active Problem List   Diagnosis Date Noted  . OSA (obstructive sleep apnea) 10/11/2014  . Excessive daytime sleepiness 10/11/2014  . Hemiparesis 10/11/2014  . Dysarthria 10/11/2014  . Pneumoperitoneum 05/01/2013  . Lower GI bleed 05/01/2013  . UTI (urinary tract infection) 05/01/2013  . Supratherapeutic INR 05/01/2013  . Chronic indwelling Foley catheter 05/01/2013  . Jejunostomy tube present (?originally G tube?) 05/01/2013  . Laceration of eyebrow s/p repair 04/26/2013 05/01/2013  . 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 Hemiparesis affecting right side as late effect of stroke 05/01/2013  . 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 DVT of right axillary vein, acute 05/01/2013  . CVA (cerebral vascular accident) 05/01/2013  . Anemia 05/01/2013  . Dysphagia     Verdie MosherSCHINKE,Jenafer Winterton, SLP 11/28/2014, 3:33 PM  [facility] [address] [place], [place], [address] Phone: [phone]    Fax:  [phone]

## 2014-11-28 NOTE — Patient Instructions (Signed)
  Please complete the assigned speech therapy homework prior to your next session.  

## 2014-12-01 ENCOUNTER — Ambulatory Visit: Payer: BLUE CROSS/BLUE SHIELD

## 2014-12-01 DIAGNOSIS — R41841 Cognitive communication deficit: Secondary | ICD-10-CM

## 2014-12-01 DIAGNOSIS — R488 Other symbolic dysfunctions: Secondary | ICD-10-CM | POA: Diagnosis not present

## 2014-12-01 DIAGNOSIS — I69320 Aphasia following cerebral infarction: Secondary | ICD-10-CM

## 2014-12-01 DIAGNOSIS — R482 Apraxia: Secondary | ICD-10-CM

## 2014-12-01 NOTE — Therapy (Signed)
[facility] [address] [place], [place], [address] Phone: [phone]   Fax:  [phone]  Speech Language Pathology Treatment  Patient Details  Name: [name] NissenRobert [name] MRN: [id number] Date of Birth: 11/04/1955 Referring Provider:  Renaye RakersBland, Veita, MD  Encounter Date: 12/01/2014      End of Session - 12/01/14 1014    Visit Number 4   Number of Visits 16   Date for SLP Re-Evaluation 01/17/15   SLP Start Time 0935   SLP Stop Time  1015   SLP Time Calculation (min) 40 min   Activity Tolerance Patient tolerated treatment well      Past Medical History  Diagnosis Date  . Dysphagia   . DVT (deep venous thrombosis)   . Hypertension   . Depression   . 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 Enlarged prostate     Past Surgical History  Procedure Laterality Date  . Esophagoscopy w/ percutaneous gastrostomy tube placement  04/12/2013    [facility]rell [facility]reth[facility]    There were no vitals filed for this visit.  Visit Diagnosis: Apraxia of speech  Aphasia as late effect of cerebrovascular accident  Cognitive communication deficit      Subjective Assessment - 12/01/14 0941    Patient is accompained by: --  alone               ADULT SLP TREATMENT - 12/01/14 0951    General Information   Behavior/Cognition Cooperative;Pleasant mood;Confused   Treatment Provided   Treatment provided Cognitive-Linquistic   Pain Assessment   Pain Assessment No/denies pain   Cognitive-Linquistic Treatment   Treatment focused on Dysarthria;Cognition   Skilled Treatment Pt with off-topic conversation today requiring SLP redirection. throughout session. SLP req'd to redirect pt back to therapy tasks. Pt admitted to SLP that he had not done any homework. SLP reminded pt that he needed to complete homework  in order to afford himself the best chance for change. Pt unscrambled sentences with 90% fluent speech. Pt told SLP 3 step  sequences with 40% success with compensations. Pt asking SLP random questions re: CVAs between responses. Similarities and differences: pt with 65% success in reducing rate and achieving fluid/fluent speech.     Assessment / Recommendations / Plan   Plan Continue with current plan of care   Progression Toward Goals   Progression toward goals --  more awareness of difference of fluent vs. dysfluent speech            SLP Short Term Goals - 12/01/14 1014    SLP SHORT TERM GOAL #1   Title pt will repeat 5--6 word sentences 80% success with occasional min A   Time 3   Period Weeks   Status On-going   SLP SHORT TERM GOAL #2   Title pt will complete HEP as directed by SLP   Time 3   Period Weeks   Status On-going   SLP SHORT TERM GOAL #3   Title pt will exhibit compensations in 7/10 sentence responses with occasional min A   Time 3   Period Weeks   Status On-going   SLP SHORT TERM GOAL #4   Title pt will complete simple conversation with compensations 70% of the time occasional min A   Time 3   Period Weeks   Status On-going  SLP Long Term Goals - 12/01/14 1014    SLP LONG TERM GOAL #1   Title pt will complete simple conversation with 70% success and rare min A for compensations   Baseline (possibly no more than 6 weeks for all LTGs depending on progress)   Time 7   Period Weeks   Status On-going   SLP LONG TERM GOAL #2   Title pt will repeat 6-8 word sentences using compensations 8/10, and rare min A   Time 7   Period Weeks   Status On-going   SLP LONG TERM GOAL #3   Title pt will demo compensations for anomia with occasional min A in simple conversation   Time 7   Period Weeks   Status On-going          Plan - 12/01/14 1014    Speech Therapy Frequency 2x / week   Duration --  7 weeks   Treatment/Interventions SLP instruction  and feedback;Compensatory strategies;Internal/external aids;Compensatory techniques;Patient/family education;Language facilitation;Functional tasks;Cueing hierarchy   Potential to Achieve Goals Fair   Potential Considerations Severity of impairments;Ability to learn/carryover information        Problem List Patient Active Problem List   Diagnosis Date Noted  . OSA (obstructive sleep apnea) 10/11/2014  . Excessive daytime sleepiness 10/11/2014  . Hemiparesis 10/11/2014  . Dysarthria 10/11/2014  . Pneumoperitoneum 05/01/2013  . Lower GI bleed 05/01/2013  . UTI (urinary tract infection) 05/01/2013  . Supratherapeutic INR 05/01/2013  . Chronic indwelling Foley catheter 05/01/2013  . Jejunostomy tube present (?originally G tube?) 05/01/2013  . Laceration of eyebrow s/p repair 04/26/2013 05/01/2013  . 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 Hemiparesis affecting right side as late effect of stroke 05/01/2013  . 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 DVT of right axillary vein, acute 05/01/2013  . CVA (cerebral vascular accident) 05/01/2013  . Anemia 05/01/2013  . Dysphagia     Verdie Mosher, SLP 12/01/2014, 10:16 AM  [facility] [address] [place], [place], [address] Phone: [phone]   Fax:  [phone]

## 2014-12-01 NOTE — Patient Instructions (Signed)
  Keep practicing slowing down in sentences

## 2014-12-06 ENCOUNTER — Ambulatory Visit (INDEPENDENT_AMBULATORY_CARE_PROVIDER_SITE_OTHER): Payer: BLUE CROSS/BLUE SHIELD | Admitting: Neurology

## 2014-12-06 VITALS — BP 159/97

## 2014-12-06 DIAGNOSIS — I10 Essential (primary) hypertension: Secondary | ICD-10-CM

## 2014-12-06 DIAGNOSIS — G4730 Sleep apnea, unspecified: Secondary | ICD-10-CM | POA: Diagnosis not present

## 2014-12-06 DIAGNOSIS — I63111 Cerebral infarction due to embolism of right vertebral artery: Secondary | ICD-10-CM

## 2014-12-06 DIAGNOSIS — G4733 Obstructive sleep apnea (adult) (pediatric): Secondary | ICD-10-CM

## 2014-12-06 NOTE — Sleep Study (Signed)
Please see the scanned sleep study interpretation located in the Procedure tab within the Chart Review section. 

## 2014-12-08 ENCOUNTER — Ambulatory Visit: Payer: BLUE CROSS/BLUE SHIELD

## 2014-12-08 DIAGNOSIS — R482 Apraxia: Secondary | ICD-10-CM

## 2014-12-08 DIAGNOSIS — R41841 Cognitive communication deficit: Secondary | ICD-10-CM

## 2014-12-08 DIAGNOSIS — R488 Other symbolic dysfunctions: Secondary | ICD-10-CM | POA: Diagnosis not present

## 2014-12-08 DIAGNOSIS — I69320 Aphasia following cerebral infarction: Secondary | ICD-10-CM

## 2014-12-08 NOTE — Therapy (Signed)
[facility] [address] [place], [place], [address] Phone: [phone]   Fax:  [phone]  Speech Language Pathology Treatment  Patient Details  Name: [name] [name] MRN: [id number] Date of Birth: 10/01/1955 Referring Provider:  Renaye Rakers, MD  Encounter Date: 12/08/2014      End of Session - 12/08/14 1100    Visit Number 5   Number of Visits 16   Date for SLP Re-Evaluation 01/17/15   SLP Start Time 1019   SLP Stop Time  1100   SLP Time Calculation (min) 41 min   Activity Tolerance Patient tolerated treatment well      Past Medical History  Diagnosis Date  . Dysphagia   . DVT (deep venous thrombosis)   . Hypertension   . Depression   . 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 Enlarged prostate     Past Surgical History  Procedure Laterality Date  . Esophagoscopy w/ percutaneous gastrostomy tube placement  04/12/2013    Dr Murrell Converse, [facility]    There were no vitals filed for this visit.  Visit Diagnosis: Apraxia of speech  Aphasia as late effect of cerebrovascular accident  Cognitive communication deficit      Subjective Assessment - 12/08/14 1021    Subjective Pt appears upset about results of sleep study - states he was worse this time.                ADULT SLP TREATMENT - 12/08/14 1032    General Information   Behavior/Cognition Alert;Cooperative;Confused   Treatment Provided   Treatment provided Cognitive-Linquistic   Pain Assessment   Pain Assessment No/denies pain   Cognitive-Linquistic Treatment   Treatment focused on Dysarthria;Apraxia;Aphasia;Cognition   Skilled Treatment Mod complex conversation re: CPAP and sleep study: pt req'd min-mod A  for slow rate, usually. In sentence tasks pt required consistent mod cues for slowed rate. Pt's responses  got offf-topic or became too verbose. SLP req'd to bring pt back to topic or encourage ceasing of pt answer occasionally.    Assessment / Recommendations / Plan   Plan Continue with current plan of care   Progression Toward Goals   Progression toward goals --  Pt with decr'd awareness and attention hindering progress          SLP Education - 12/08/14 1059    Education Details compensations for dysarthria   Person(s) Educated Patient   Methods Explanation;Demonstration;Verbal cues   Comprehension Verbalized understanding;Verbal cues required;Need further instruction          SLP Short Term Goals - 12/08/14 1101    SLP SHORT TERM GOAL #1   Title pt will repeat 5--6 word sentences 80% success with occasional min A   Time 2   Period Weeks   Status On-going   SLP SHORT TERM GOAL #2   Title pt will complete HEP as directed by SLP   Time 2   Period Weeks   Status On-going   SLP SHORT TERM GOAL #3   Title pt will exhibit compensations in 7/10 sentence responses with occasional min A   Time 2   Period Weeks   Status On-going   SLP SHORT TERM GOAL #4   Title pt will complete simple conversation with compensations 70% of the time occasional min A   Time 2   Period Weeks  Status On-going          SLP Long Term Goals - 12/08/14 1101    SLP LONG TERM GOAL #1   Title pt will complete simple conversation with 70% success and rare min A for compensations   Baseline (possibly no more than 6 weeks for all LTGs depending on progress)   Time 6   Period Weeks   Status On-going   SLP LONG TERM GOAL #2   Title pt will repeat 6-8 word sentences using compensations 8/10, and rare min A   Time 6   Period Weeks   Status On-going   SLP LONG TERM GOAL #3   Title pt will demo compensations for anomia with occasional min A in simple conversation   Time 6   Period Weeks   Status On-going          Plan - 12/08/14 1100    Clinical Impression Statement Decr'd attention and  awareness are hindering pt progress. 2 more weeks of skilled ST will be used to give pt best opportunity for positive change.   Speech Therapy Frequency 2x / week   Duration --  7 weeks   Treatment/Interventions SLP instruction and feedback;Compensatory strategies;Internal/external aids;Compensatory techniques;Patient/family education;Language facilitation;Functional tasks;Cueing hierarchy   Potential to Achieve Goals Fair   Potential Considerations Severity of impairments;Ability to learn/carryover information        Problem List Patient Active Problem List   Diagnosis Date Noted  . OSA (obstructive sleep apnea) 10/11/2014  . Excessive daytime sleepiness 10/11/2014  . Hemiparesis 10/11/2014  . Dysarthria 10/11/2014  . Pneumoperitoneum 05/01/2013  . Lower GI bleed 05/01/2013  . UTI (urinary tract infection) 05/01/2013  . Supratherapeutic INR 05/01/2013  . Chronic indwelling Foley catheter 05/01/2013  . Jejunostomy tube present (?originally G tube?) 05/01/2013  . Laceration of eyebrow s/p repair 04/26/2013 05/01/2013  . 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 Hemiparesis affecting right side as late effect of stroke 05/01/2013  . 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 DVT of right axillary vein, acute 05/01/2013  . CVA (cerebral vascular accident) 05/01/2013  . Anemia 05/01/2013  . Dysphagia     Verdie MosherSCHINKE,CARL, SLP 12/08/2014, 11:05 AM  [facility] [address] [place], [place], [address] Phone: [phone]   Fax:  [phone]

## 2014-12-08 NOTE — Patient Instructions (Signed)
  Please complete the assigned speech therapy homework and return it to your next session.  

## 2014-12-12 ENCOUNTER — Telehealth: Payer: Self-pay | Admitting: *Deleted

## 2014-12-12 ENCOUNTER — Ambulatory Visit: Payer: BLUE CROSS/BLUE SHIELD | Attending: Neurology

## 2014-12-12 DIAGNOSIS — I69320 Aphasia following cerebral infarction: Secondary | ICD-10-CM

## 2014-12-12 DIAGNOSIS — R482 Apraxia: Secondary | ICD-10-CM

## 2014-12-12 DIAGNOSIS — R41841 Cognitive communication deficit: Secondary | ICD-10-CM

## 2014-12-12 DIAGNOSIS — R488 Other symbolic dysfunctions: Secondary | ICD-10-CM | POA: Diagnosis not present

## 2014-12-12 DIAGNOSIS — I69920 Aphasia following unspecified cerebrovascular disease: Secondary | ICD-10-CM | POA: Insufficient documentation

## 2014-12-12 NOTE — Telephone Encounter (Signed)
Refax the release on 12/12/14 requesting records.

## 2014-12-12 NOTE — Telephone Encounter (Signed)
Called and spoke with pt to let him know we requested records but [facility] and I am having medical records here fax over request again. I apologized for any inconvenience and told pt to call back if he needs anything else. Pt verbalized understanding and said he needs records before his appt in September.

## 2014-12-12 NOTE — Therapy (Signed)
[facility] [address] [place], [place], [address] Phone: [phone]   Fax:  [phone]  Speech Language Pathology Treatment  Patient Details  Name: [name] NissenRobert [name] MRN: [id number] Date of Birth: [birth year] Referring Provider:  Renaye RakersBland, Veita, MD  Encounter Date: 12/12/2014      End of Session - 12/12/14 1903    Visit Number 6   Number of Visits 16   Date for SLP Re-Evaluation 01/17/15   SLP Start Time 1534   SLP Stop Time  1619   SLP Time Calculation (min) 45 min      Past Medical History  Diagnosis Date  . Dysphagia   . DVT (deep venous thrombosis)   . Hypertension   . Depression   . 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 Enlarged prostate     Past Surgical History  Procedure Laterality Date  . Esophagoscopy w/ percutaneous gastrostomy tube placement  04/12/2013    Dr Murrell [facility]Hildreth[facility]    There were no vitals filed for this visit.  Visit Diagnosis: Cognitive communication deficit  Apraxia of speech  Aphasia as late effect of cerebrovascular accident      Subjective Assessment - 12/12/14 1554    Subjective "I don't want to hold you up." Pt asking questions again re: breath support. SLP told pt his device he brought in was not useful anymore as pt maxed out (reported 3/10 effort on max setting) with inhaling and exhaling.               ADULT SLP TREATMENT - 12/12/14 1538    General Information   Behavior/Cognition Alert;Cooperative;Pleasant mood   Treatment Provided   Treatment provided Cognitive-Linquistic   Pain Assessment   Pain Assessment No/denies pain   Cognitive-Linquistic Treatment   Treatment focused on Dysarthria   Skilled Treatment Pt with off-topic comments occasionally during sesion today. SLP facilitated practice with slow rate and  overarticulation in structured speech tasks (sentences) wiht 90% success. In simple conversation pt req'd mod to max cues (verbal, visual, demo) to reduce rate and therefore produce more fluent speech. SLP used clinical judgement to reduce task complexity and require pt to produce 2-sentence responses. Pt req'd max A (verbal, demo, visual) to achieve fluent speech  SLP suspects pt's attention plays a negative role in pt's fluency due to competing thoughts   Assessment / Recommendations / Plan   Plan Continue with current plan of care   Progression Toward Goals   Progression toward goals Not progressing toward goals (comment)  decr cognition and awareness negatively affecting progress          SLP Education - 12/12/14 1902    Education provided Yes   Education Details dysarthria compensations, adult care services including [facility]   Person(s) Educated Patient;Caregiver(s)  brother (Adult care services)   Methods Explanation;Demonstration;Verbal cues   Comprehension Verbalized understanding;Verbal cues required;Need further instruction          SLP Short Term Goals - 12/12/14 1905    SLP SHORT TERM GOAL #1   Title pt will repeat 5--6 word sentences 80% success with occasional min A   Status Achieved   SLP SHORT TERM GOAL #2   Title pt will complete HEP as directed by SLP   Time 1   Period Weeks   Status On-going   SLP SHORT TERM GOAL #3  Title pt will exhibit compensations in 7/10 sentence responses with occasional min A   Status Achieved   SLP SHORT TERM GOAL #4   Title pt will complete simple conversation with compensations 70% of the time occasional min A   Time 1   Period Weeks   Status On-going          SLP Long Term Goals - 12/12/14 1906    SLP LONG TERM GOAL #1   Title pt will complete simple conversation with 70% success and rare min A for compensations   Baseline (possibly no more than 6 weeks for all LTGs depending on progress)   Time 5   Period Weeks    Status On-going   SLP LONG TERM GOAL #2   Title pt will repeat 6-8 word sentences using compensations 8/10, and rare min A   Time 5   Period Weeks   Status On-going   SLP LONG TERM GOAL #3   Title pt will demo compensations for anomia with occasional min A in simple conversation   Time 5   Period Weeks   Status On-going          Plan - 12/12/14 1606    Clinical Impression Statement Suspect attention deficits are impeding fluency due to competing ideas as pt attempting to verbalize his thoughts.    Speech Therapy Frequency 2x / week   Duration --  6 weeks   Treatment/Interventions SLP instruction and feedback;Compensatory strategies;Internal/external aids;Compensatory techniques;Patient/family education;Language facilitation;Functional tasks;Cueing hierarchy   Potential to Achieve Goals Fair   Potential Considerations Severity of impairments;Ability to learn/carryover information        Problem List Patient Active Problem List   Diagnosis Date Noted  . OSA (obstructive sleep apnea) 10/11/2014  . Excessive daytime sleepiness 10/11/2014  . Hemiparesis 10/11/2014  . Dysarthria 10/11/2014  . Pneumoperitoneum 05/01/2013  . Lower GI bleed 05/01/2013  . UTI (urinary tract infection) 05/01/2013  . Supratherapeutic INR 05/01/2013  . Chronic indwelling Foley catheter 05/01/2013  . Jejunostomy tube present (?originally G tube?) 05/01/2013  . Laceration of eyebrow s/p repair 04/26/2013 05/01/2013  . 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 Hemiparesis affecting right side as late effect of stroke 05/01/2013  . 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 DVT of right axillary vein, acute 05/01/2013  . CVA (cerebral vascular accident) 05/01/2013  . Anemia 05/01/2013  . Dysphagia     Verdie Mosher, SLP 12/12/2014, 7:07 PM  [facility] [address] [place], [place], [address] Phone: [phone]    Fax:  [phone]

## 2014-12-13 ENCOUNTER — Telehealth: Payer: Self-pay | Admitting: *Deleted

## 2014-12-13 NOTE — Telephone Encounter (Signed)
[facility] [place].

## 2014-12-14 ENCOUNTER — Telehealth: Payer: Self-pay

## 2014-12-14 NOTE — Telephone Encounter (Signed)
I have them, thanks

## 2014-12-14 NOTE — Telephone Encounter (Signed)
Please reschedule pt for appointment with Dr. Lucia GaskinsAhern in a 15 min slot thanks

## 2014-12-14 NOTE — Telephone Encounter (Signed)
Pt was contacted concerning rescheduling appointment form bump list, Voicemail was reached and messaged left requesting pt call back to reschedule appt

## 2014-12-15 ENCOUNTER — Ambulatory Visit: Payer: BLUE CROSS/BLUE SHIELD

## 2014-12-15 DIAGNOSIS — R482 Apraxia: Secondary | ICD-10-CM

## 2014-12-15 DIAGNOSIS — R488 Other symbolic dysfunctions: Secondary | ICD-10-CM | POA: Diagnosis not present

## 2014-12-15 DIAGNOSIS — I69320 Aphasia following cerebral infarction: Secondary | ICD-10-CM

## 2014-12-15 DIAGNOSIS — R41841 Cognitive communication deficit: Secondary | ICD-10-CM

## 2014-12-15 NOTE — Patient Instructions (Addendum)
  Please complete the assigned speech therapy homework prior to your next session. Make sure you read each one slowly and then [facility] about your answer to each question prior to answering.

## 2014-12-15 NOTE — Therapy (Signed)
[facility] [address] [place], [place], [address] Phone: [phone]   Fax:  [phone]  Speech Language Pathology Treatment  Patient Details  Name: [name] NissenRobert [name] MRN: [id number] Date of Birth: 06/05/1956 Referring Provider:  Renaye RakersBland, Veita, MD  Encounter Date: 12/15/2014      End of Session - 12/15/14 1156    Visit Number 7   Number of Visits 16   Date for SLP Re-Evaluation 01/17/15   SLP Start Time 1104   SLP Stop Time  1145   SLP Time Calculation (min) 41 min   Activity Tolerance Patient tolerated treatment well      Past Medical History  Diagnosis Date  . Dysphagia   . DVT (deep venous thrombosis)   . Hypertension   . Depression   . 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 Enlarged prostate     Past Surgical History  Procedure Laterality Date  . Esophagoscopy w/ percutaneous gastrostomy tube placement  04/12/2013    [facility] Mur[facility]ildreth[facility]    There were no vitals filed for this visit.  Visit Diagnosis: Apraxia of speech  Aphasia as late effect of cerebrovascular accident  Cognitive communication deficit      Subjective Assessment - 12/15/14 1106    Subjective "I hadn't been able to -- to ah, -- venture out fo the house at all" Pt reports not being able to clarify with roofer what to do re: estimate. Pt reports that he will normally ask brother/s to come over to explain to repair people what needs to be done.               ADULT SLP TREATMENT - 12/15/14 1111    General Information   Behavior/Cognition Alert;Cooperative;Pleasant mood;Confused   Treatment Provided   Treatment provided Cognitive-Linquistic   Pain Assessment   Pain Assessment No/denies pain   Cognitive-Linquistic Treatment   Treatment focused on Dysarthria   Skilled Treatment Difficulty with  roofer appears to be more cognitively based than due to dysarthria. SLP reiterated to pt need to reduce rate to be able to give brain enough time to clearly think through what pt wants to communicate (reducing attention/organization hindrance on communicating his message). Pt completed strucutred sentence tasks with 85-90% success with fluent speech. 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 At this approx 1 minute conversational level, pt req'd cues to remain on topic usually and pt's level of fluency decr'd. Pt's volume was largely too loud today without pt aware during this time as well.   Assessment / Recommendations / Plan   Plan Continue with current plan of care   Progression Toward Goals   Progression toward goals Not progressing toward goals (comment)  likely discharge next week          SLP Education - 12/15/14 1142    Education provided Yes   Education Details compensations (need to reduce rate), and reasons why   Person(s) Educated Patient   Methods Explanation;Demonstration;Verbal cues   Comprehension Verbalized understanding;Verbal cues required;Need further instruction          SLP Short Term Goals - 12/12/14 1905    SLP SHORT TERM GOAL #1   Title pt will repeat  5--6 word sentences 80% success with occasional min A   Status Achieved   SLP SHORT TERM GOAL #2   Title pt will complete HEP as directed by SLP   Time 1   Period Weeks   Status On-going   SLP SHORT TERM GOAL #3   Title pt will exhibit compensations in 7/10 sentence responses with occasional min A   Status Achieved   SLP SHORT TERM GOAL #4   Title pt will complete simple conversation with compensations 70% of the time occasional min A   Time 1   Period Weeks   Status On-going          SLP Long Term Goals - 12/12/14 1906    SLP LONG TERM GOAL #1   Title  pt will complete simple conversation with 70% success and rare min A for compensations   Baseline (possibly no more than 6 weeks for all LTGs depending on progress)   Time 5   Period Weeks   Status On-going   SLP LONG TERM GOAL #2   Title pt will repeat 6-8 word sentences using compensations 8/10, and rare min A   Time 5   Period Weeks   Status On-going   SLP LONG TERM GOAL #3   Title pt will demo compensations for anomia with occasional min A in simple conversation   Time 5   Period Weeks   Status On-going          Plan - 12/15/14 1157    Clinical Impression Statement Likely discharge next week. Pt has seen success in more fluent speech at sentence level tasks but cognition-linguistic deficits consistently hinder progress with multi sentence/conversation.   Speech Therapy Frequency 2x / week   Duration --  6 weeks   Treatment/Interventions SLP instruction and feedback;Compensatory strategies;Internal/external aids;Compensatory techniques;Patient/family education;Language facilitation;Functional tasks;Cueing hierarchy   Potential to Achieve Goals Fair   Potential Considerations Severity of impairments;Ability to learn/carryover information        Problem List Patient Active Problem List   Diagnosis Date Noted  . OSA (obstructive sleep apnea) 10/11/2014  . Excessive daytime sleepiness 10/11/2014  . Hemiparesis 10/11/2014  . Dysarthria 10/11/2014  . Pneumoperitoneum 05/01/2013  . Lower GI bleed 05/01/2013  . UTI (urinary tract infection) 05/01/2013  . Supratherapeutic INR 05/01/2013  . Chronic indwelling Foley catheter 05/01/2013  . Jejunostomy tube present (?originally G tube?) 05/01/2013  . Laceration of eyebrow s/p repair 04/26/2013 05/01/2013  . 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 Hemiparesis affecting right side as late effect of stroke 05/01/2013  . 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 DVT of right axillary vein, acute 05/01/2013   . CVA (cerebral vascular accident) 05/01/2013  . Anemia 05/01/2013  . Dysphagia     Verdie MosherSCHINKE,CARL, SLP 12/15/2014, 12:04 PM  [facility] [address] [place], [place], [address] Phone: [phone]   Fax:  [phone]

## 2014-12-20 ENCOUNTER — Telehealth: Payer: Self-pay | Admitting: *Deleted

## 2014-12-20 NOTE — Telephone Encounter (Signed)
Left message for [name] (brother) who is listed on pt DPR. Asked for him to call us back and gave [facility] phone number and office hours, stating Dr. Lucia Gaskinsahern and I are not in the office on Friday's. Trying to schedule pt with Dr. Pearlean BrownieSethi in August instead of with Dr. Lucia GaskinsAhern in June. Please reschedule if he calls back, thank you!

## 2014-12-21 NOTE — Telephone Encounter (Signed)
[name] [name], pt's brother called and I informed him of [name]'s message from 12/20/14. Patient is scheduled to see Dr. Pearlean BrownieSethi on 03/13/15.

## 2014-12-22 ENCOUNTER — Ambulatory Visit: Payer: BLUE CROSS/BLUE SHIELD

## 2014-12-22 DIAGNOSIS — R488 Other symbolic dysfunctions: Secondary | ICD-10-CM | POA: Diagnosis not present

## 2014-12-22 DIAGNOSIS — R41841 Cognitive communication deficit: Secondary | ICD-10-CM

## 2014-12-22 DIAGNOSIS — I69320 Aphasia following cerebral infarction: Secondary | ICD-10-CM

## 2014-12-22 NOTE — Therapy (Signed)
[facility] [address] [place], [place], [address] Phone: [phone]   Fax:  [phone]  Speech Language Pathology Treatment  Patient Details  Name: [name] [name] MRN: [id number] Date of Birth: 1956-07-28 Referring Provider:  Lucianne Lei, MD  Encounter Date: 12/22/2014      End of Session - 12/22/14 1314    Visit Number 8   Number of Visits 16   Date for SLP Re-Evaluation 01/17/15   SLP Start Time 1151   SLP Stop Time  1231   SLP Time Calculation (min) 40 min   Activity Tolerance Other (comment)  limited due to decr'd attention      Past Medical History  Diagnosis Date  . Dysphagia   . DVT (deep venous thrombosis)   . Hypertension   . Depression   . 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 Enlarged prostate     Past Surgical History  Procedure Laterality Date  . Esophagoscopy w/ percutaneous gastrostomy tube placement  04/12/2013    Dr Rinaldo Cloud, [facility]    There were no vitals filed for this visit.  Visit Diagnosis: Aphasia as late effect of cerebrovascular accident  Cognitive communication deficit      Subjective Assessment - 12/22/14 1158    Subjective Pt with many questions about his decr'd memory and what caused it. SLP tried to postulate with pt that it was the CVA, however pt questions whther or not medication playing a role. SLP encouraged pt to contact PCP or neurologist to ask.                ADULT SLP TREATMENT - 12/22/14 1206    General Information   Behavior/Cognition Alert;Cooperative;Pleasant mood;Confused   Treatment Provided   Treatment provided Cognitive-Linquistic   Pain Assessment   Pain Assessment No/denies pain   Cognitive-Linquistic Treatment   Treatment focused on Dysarthria;Cognition   Skilled Treatment (cognition - 18 minutes)  Pt perserverating today about feeling lethargic, reduced memory, what caused his CVA, and why he isn't progressing any more than he has to date. SLP attempted to redirect pt back to ST tasks, but ultimately needed to refer to Dr. Jaynee Eagles note from March indicating blood clot causing CVA.  SLP referred pt to PCP and neurologist with questions re: if medication causing memory issues. SLP attempted to talk with pt and remind him that those MDs know more about meds than SLP and would be able to assist pt with those concerns. SLP encouraged pt to look at progress in terms of where he began the rehab process compared to what he has accopmlished to today.  (speech tx <30 minutes): SLP worked with pt with sentence tasks with rate reduction in between pt perseverating on issues concerning him today. SLP req'd to provide verbal and demo cues usually for using compensatory strategies for improved intelligibility. SLP provided pt with speech tasks he can complete x2-3/week to work witih compensations for improved intelligibility.             SLP Short Term Goals - 12/12/14 1905    SLP SHORT TERM GOAL #1   Title pt will repeat 5--6 word sentences 80% success with occasional min A   Status Achieved   SLP SHORT TERM GOAL #2   Title pt will complete HEP as directed by SLP   Time 1   Period  Weeks   Status On-going   SLP SHORT TERM GOAL #3   Title pt will exhibit compensations in 7/10 sentence responses with occasional min A   Status Achieved   SLP SHORT TERM GOAL #4   Title pt will complete simple conversation with compensations 70% of the time occasional min A   Time 1   Period Weeks   Status On-going          SLP Long Term Goals - 12/12/14 1906    SLP LONG TERM GOAL #1   Title pt will complete simple conversation with 70% success and rare min A for compensations   Baseline (possibly no more than 6 weeks for all LTGs depending on progress)   Time 5   Period Weeks   Status On-going   SLP LONG TERM  GOAL #2   Title pt will repeat 6-8 word sentences using compensations 8/10, and rare min A   Time 5   Period Weeks   Status On-going   SLP LONG TERM GOAL #3   Title pt will demo compensations for anomia with occasional min A in simple conversation   Time 5   Period Weeks   Status On-going          Plan - 12/22/14 1315    Clinical Impression Statement Pt will be discharged today due to maximum rehab potential reached at this time. Pt's attention skills are not appropriate to warrant further therapy at this time.   Treatment/Interventions SLP instruction and feedback;Compensatory strategies;Internal/external aids;Compensatory techniques;Patient/family education;Language facilitation;Functional tasks;Cueing hierarchy   Potential to Achieve Goals Fair   Potential Considerations Severity of impairments;Ability to learn/carryover information     SPEECH THERAPY DISCHARGE SUMMARY  Visits from Start of Care: 8  Current functional level related to goals / functional outcomes: Pt met goals as indicated above in "short term goals" and "long term goals". Pt is discharged primarily due to cognitive-attention deficits hindering progress further than sentence level. Many sessions of ST were like the session today (described above) with the patient asking and perseverating on topics unrelated to his speech, directly. The patient's awareness is such that he is aware of the presence of his deficits but SLP believes he does not possess the cognitive-linguistic ability to problem solve or attend long enough to exhibit further progress in therapy at this time. In multiple therapy sessions SLP had to remind pt that he was very unlikely to recover his speech skills back to 100% as prior to CVA. SLP also had to refer pt to PCP or neurologist for answering some of his perseverative topics and questions regarding his medications, or his neurological background and history.    Remaining deficits: Mod  dysarthria, apraxia of speech, expressive aphasia, mod cognitive-linguistic deficits.   Education / Equipment: Compensations for speech intelligibility.  Plan: Patient agrees to discharge.  Patient goals were not met. Patient is being discharged due to                                                     ?????meeting current rehab potential.  If pt's attention skills and awareness skills improve pt may be able to make further gains in therapy.       Problem List Patient Active Problem List   Diagnosis Date Noted  . OSA (obstructive sleep apnea) 10/11/2014  . Excessive  daytime sleepiness 10/11/2014  . Hemiparesis 10/11/2014  . Dysarthria 10/11/2014  . Pneumoperitoneum 05/01/2013  . Lower GI bleed 05/01/2013  . UTI (urinary tract infection) 05/01/2013  . Supratherapeutic INR 05/01/2013  . Chronic indwelling Foley catheter 05/01/2013  . Jejunostomy tube present (?originally G tube?) 05/01/2013  . Laceration of eyebrow s/p repair 04/26/2013 05/01/2013  . 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 Hemiparesis affecting right side as late effect of stroke 05/01/2013  . 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 DVT of right axillary vein, acute 05/01/2013  . CVA (cerebral vascular accident) 05/01/2013  . Anemia 05/01/2013  . Dysphagia     Johncharles Fusselman, ms, ccc-slp 12/22/2014, 1:20 PM  [facility] [address] [place], [place], [address] Phone: [phone]   Fax:  [phone]

## 2014-12-22 NOTE — Patient Instructions (Signed)
  Complete speech tasks I've provided about 2-3 times a week to give yourself more practice with slowing down.

## 2014-12-26 ENCOUNTER — Telehealth: Payer: Self-pay

## 2014-12-26 NOTE — Telephone Encounter (Signed)
Called pt to give him sleep study results, no answer, left message on machine for him to call me back.

## 2014-12-27 NOTE — Telephone Encounter (Signed)
Attempted to call pt again, no answer, left message asking him to call me back. 

## 2015-01-01 ENCOUNTER — Telehealth: Payer: Self-pay

## 2015-01-01 DIAGNOSIS — G4733 Obstructive sleep apnea (adult) (pediatric): Secondary | ICD-10-CM

## 2015-01-01 NOTE — Telephone Encounter (Signed)
No answer. Will try brother's phone per DPR.

## 2015-01-01 NOTE — Telephone Encounter (Signed)
Spoke to pt at [phone] per DPR. Pt verified name and DOB for me. Gave pt results of cpap titration. Pt wishes to proceed with cpap. Pt states his insurance is [employer] and does not have a DME preference. [facility] Pt declined a f/u appt at this time, even though I instructed him that insurance needs a f/u within 30-60 days of starting cpap.

## 2015-01-02 ENCOUNTER — Telehealth: Payer: Self-pay

## 2015-01-02 NOTE — Telephone Encounter (Signed)
Pt's brother following up on a phone call. Spoke to him per [facility]. Informed him that I gave the pt his sleep study results yesterday. Verbalized understanding.

## 2015-02-08 ENCOUNTER — Telehealth: Payer: Self-pay

## 2015-02-08 NOTE — Telephone Encounter (Signed)
LVM for patient to call office to discuss botox.

## 2015-02-13 NOTE — Telephone Encounter (Signed)
Patient's brother(Haywood) called returning [name]'s call. Please call and advise. He can be reached at [phone].

## 2015-02-14 NOTE — Telephone Encounter (Signed)
Returned call. No answer.  

## 2015-02-19 ENCOUNTER — Telehealth: Payer: Self-pay | Admitting: Neurology

## 2015-02-19 NOTE — Telephone Encounter (Signed)
Patient's brother [name] is returning your call of 7/6.  Please call back @ [phone].

## 2015-02-20 NOTE — Telephone Encounter (Signed)
Spoke to brother. He states they would like to move forward with Botox. I explained the process and advised would initiate Botox process and call back in 1-2 days after insurance verification completed. Brother verbalized understanding.

## 2015-03-13 ENCOUNTER — Ambulatory Visit (INDEPENDENT_AMBULATORY_CARE_PROVIDER_SITE_OTHER): Payer: BLUE CROSS/BLUE SHIELD | Admitting: Neurology

## 2015-03-13 ENCOUNTER — Encounter: Payer: Self-pay | Admitting: Neurology

## 2015-03-13 VITALS — BP 139/76 | HR 66 | Ht 71.0 in | Wt 221.6 lb

## 2015-03-13 DIAGNOSIS — I651 Occlusion and stenosis of basilar artery: Secondary | ICD-10-CM

## 2015-03-13 DIAGNOSIS — I82A21 Chronic embolism and thrombosis of right axillary vein: Secondary | ICD-10-CM

## 2015-03-13 MED ORDER — CLOPIDOGREL BISULFATE 75 MG PO TABS: 75.0000 mg | ORAL_TABLET | Freq: Every day | ORAL | Status: DC

## 2015-03-13 NOTE — Patient Instructions (Signed)
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 He also had right upper extremity deep vein thrombosis at that time. It is unclear to me whether he has any underlying primary hypercoagulable disorder and he has been on warfarin for 2 years. I recommend discontinuing warfarin and checking hypercoagulable panel labs 1 week after stopping it. Start Plavix 75 mg daily instead. Maintain strict control of hypertension with blood pressure goal below 130/90, lipids with LDL cholesterol goal below 70 mg percent and diabetes with hemoglobin A1c goal below 6.0. Check carotid and transcranial Doppler studies. Patient is interested in getting Botox for spasticity we will check with his insurance company for approval. This was a 30 minute visit with greater than 50% of time spent in counseling and coordination of care. Return for follow-up in 3 months or call earlier if necessary. Stroke Prevention Some medical conditions and behaviors are associated with an increased chance of having a stroke. You may prevent a stroke by making healthy choices and managing medical conditions. HOW CAN I REDUCE MY RISK OF HAVING A STROKE?   Stay physically active. Get at least 30 minutes of activity on most or all days.  Do not smoke. It may also be helpful to avoid exposure to secondhand smoke.  Limit alcohol use. Moderate alcohol use is considered to be:  No more than 2 drinks per day for men.  No more than 1 drink per day for nonpregnant women.  Eat healthy foods. This involves:  Eating 5 or more servings of fruits and vegetables a day.  Making dietary changes that address high blood pressure (hypertension), high cholesterol, diabetes, or obesity.  Manage your cholesterol levels.  Making food choices that are high in fiber and low in saturated fat, trans fat, and cholesterol may control cholesterol levels.  Take any  prescribed medicines to control cholesterol as directed by your health care provider.  Manage your diabetes.  Controlling your carbohydrate and sugar intake is recommended to manage diabetes.  Take any prescribed medicines to control diabetes as directed by your health care provider.  Control your hypertension.  Making food choices that are low in salt (sodium), saturated fat, trans fat, and cholesterol is recommended to manage hypertension.  Take any prescribed medicines to control hypertension as directed by your health care provider.  Maintain a healthy weight.  Reducing calorie intake and making food choices that are low in sodium, saturated fat, trans fat, and cholesterol are recommended to manage weight.  Stop drug abuse.  Avoid taking birth control pills.  Talk to your health care provider about the risks of taking birth control pills if you are over 7 years old, smoke, get migraines, or have ever had a blood clot.  Get evaluated for sleep disorders (sleep apnea).  Talk to your health care provider about getting a sleep evaluation if you snore a lot or have excessive sleepiness.  Take medicines only as directed by your health care provider.  For some people, aspirin or blood thinners (anticoagulants) are helpful in reducing the risk of forming abnormal blood clots that can lead to stroke. If you have the irregular heart rhythm of atrial fibrillation, you should be on a blood thinner unless there is a good reason you cannot take them.  Understand all your medicine instructions.  Make sure that other conditions (such as anemia or atherosclerosis) are addressed. SEEK IMMEDIATE MEDICAL CARE IF:   You  have sudden weakness or numbness of the face, arm, or leg, especially on one side of the body.  Your face or eyelid droops to one side.  You have sudden confusion.  You have trouble speaking (aphasia) or understanding.  You have sudden trouble seeing in one or both  eyes.  You have sudden trouble walking.  You have dizziness.  You have a loss of balance or coordination.  You have a sudden, severe headache with no known cause.  You have new chest pain or an irregular heartbeat. Any of these symptoms may represent a serious problem that is an emergency. Do not wait to see if the symptoms will go away. Get medical help at once. Call your local emergency services (911 in [place] Do not drive yourself to the hospital. Document Released: 09/04/2004 Document Revised: 12/12/2013 Document Reviewed: 01/28/2013 [facility] Patient Information 2015 [place], [place]. This information is not intended to replace advice given to you by your health care provider. Make sure you discuss any questions you have with your health care provider.

## 2015-03-14 NOTE — Progress Notes (Signed)
[facility]    Provider:  Dr Lucia Gaskins Referring Provider: Renaye Rakers, MD Primary Care Physician:  Geraldo Pitter, MD  CC:  Stroke  HPI:  [name] [name] is a 59 y.o. male here as a referral from Dr. Parke Simmers for stroke. PMHx memory loss, stroke, hypertension 60 year old male with history of hypertension, history of DVT on Coumadin who was admitted to [facility] recently with an acute stroke involving a large area on the left side with residual right hemiplegia and dysphasia was discharged to skilled nursing facility. His brother is here and provides most information, patient is a poor historian. Brother dsays patient had a clot that traveled through his lungs to the posterior circulation and casued the stroke. He had a PEG tube placed in the hospital due to dysphagia. He has no records with him. [facility] was going [facility] for [facility] updates. He still has weakness and some dysarthria. He can barely walk without the cane. He has drooling which is worse. He is in physical therapy. Has been a year since the last speech therapy. He has some vision problems reading close up. 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 He is having frequent awakenings at night for unknown reasons, He lives alone, unclear if snores and is excessively tired during the day. They found him in his truck, they think he was there in the truck for 2-3 days after having the stroke, patient couldn't move and couldn't get out of the truck. He has gained a lot of weight recently.  Update 03/14/2015 : Patient returns for follow-up after last visit with Dr. Lucia Gaskins in March 2016. A helical CT of the basilar. Patient continues to have significant right arm weakness that is now able to walk with a cane. He has mild dysarthria and speech expressive difficulties. He is living alone but the brother lives nearby and lites some help. He has not had  any recurrent stroke or TIA symptoms. His blood pressure is under good control is 139/76 in the office. I have reviewed his prior stroke records from August 2014 at [facility] care everywhere. Reviewed notes, labs and imaging from Received records from [facility]. 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 He has residual right-sided hemiparesis. He presented initially to an outside facility in August 2014 after being found down in his truck. CT of the head showed subacute left pontine infarct. NIH stroke scale of 30. 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 Patient was taken for a basilar thrombectomy. This did not reestablish flow. 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 He was started on anticoagulation due to the intra-arterial clot as well as a right upper extremity DVT. He has been continued on warfarin since then. INR goal 2-3. Last neurology follow-up visit in January 2016 had suggested stopping warfarin and checking hypercoagulable panel but this was not done    .   Review of Systems: Patient complains of symptoms per HPI as well as the following symptoms: Insomnia, weakness, gait difficulty, leg weakness. Pertinent negatives per HPI. All others negative.   History   Social History  . Marital Status: Married    Spouse  Name: N/A  . Number of Children: 0  . Years of Education: 12   Occupational History  . umemployed    Social History Main Topics  . Smoking status: Never Smoker   . Smokeless tobacco: Not on file  . Alcohol Use: No  . Drug Use: No  . Sexual Activity: Yes   Other Topics Concern  . Not on file   Social History Narrative   Lives at home by self.    Single.   Caffeine use: daily    Family History    Problem Relation Age of Onset  . High blood pressure    . Diabetes    . Heart Problems    . Heart Problems Mother   . Stroke Father     Past Medical History  Diagnosis Date  . Dysphagia   . DVT (deep venous thrombosis)   . Hypertension   . Depression   . 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 Enlarged prostate     Past Surgical History  Procedure Laterality Date  . Esophagoscopy w/ percutaneous gastrostomy tube placement  04/12/2013    Dr Murrell Converse, [facility]    Current Outpatient Prescriptions  Medication Sig Dispense Refill  . atorvastatin (LIPITOR) 40 MG tablet 40 mg by PEG Tube route daily.    [name] doxazosin (CARDURA) 1 MG tablet Take 1 tablet by mouth daily.  0  . QUEtiapine (SEROQUEL) 25 MG tablet 25 mg by PEG Tube route at bedtime.    . sertraline (ZOLOFT) 25 MG tablet 25 mg by PEG Tube route daily.    [name] acetaminophen (TYLENOL) 160 MG/5ML liquid Take 640 mg by mouth every 4 (four) hours as needed for fever.     . clopidogrel (PLAVIX) 75 MG tablet Take 1 tablet (75 mg total) by mouth daily. 30 tablet 11   No current facility-administered medications for this visit.    Allergies as of 03/13/2015  . (No Known Allergies)    Vitals: BP 139/76 mmHg  Pulse 66  Ht 5\' 11"  (1.803 m)  Wt 221 lb 9.6 oz (100.517 kg)  BMI 30.92 kg/m2 Last Weight:  Wt Readings from Last 1 Encounters:  03/13/15 221 lb 9.6 oz (100.517 kg)   Last Height:   Ht Readings from Last 1 Encounters:  03/13/15 5\' 11"  (1.803 m)    Physical exam: Exam: Gen: NAD, well groomed  middle-aged male                   CV: RRR, no MRG. No Carotid Bruits. No peripheral edema, warm, nontender Eyes: Conjunctivae clear without exudates or hemorrhage Lungs clear to auscultation. Neuro: Awake alert oriented 3 Speech:    Speech is dysarthric;  Cognition:    The patient is  oriented to person    recent and remote memory impaired;     Language not fluent;     Impaired attention, concentration,     fund of knowledge appears impaired Cranial Nerves:    The pupils are equal, round, and reactive to light. Cannot visualize fundi due to patient movements.  Visual fields are full to finger confrontation. Extraocular movements are intact. Trigeminal sensation is intact and the muscles of mastication are normal. The face is slightly asymmetric. The palate elevates in the midline. Hearing appears intact. Voice is normal. Shoulder shrug is normal. The tongue has normal motion  without fasciculations.   Coordination:    Cannot perform on the right due to spasticity and weakness  Gait:   Right leg circumduction with flexion at the knee  Motor Observation:    no involuntary movements noted. Tone:    Normal muscle tone.    Posture:    Posture is normal. normal erect    Strength:    Right spastic hemiparesis, right deltoid and hip flexion 2+/5 in upper extremities and 4/5 in the lower extremities except ankle dorsiflexor weakness with footdrop     Sensation: intact to LT     Reflex Exam:  DTR's:    Brisk on the right Toes:    The toes are up on the right, down on the left    Clonus:    Clonus is absent.      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 He also had right upper extremity deep vein thrombosis at that time. It is unclear to me whether he has any underlying primary hypercoagulable disorder and he has been on warfarin for 2 years. I recommend discontinuing warfarin and checking hypercoagulable panel labs 1 week after stopping it. Start Plavix 75  mg daily instead. Maintain strict control of hypertension with blood pressure goal below 130/90, lipids with LDL cholesterol goal below 70 mg percent and diabetes with hemoglobin A1c goal below 6.0. Check carotid and transcranial Doppler studies. Patient is interested in getting Botox for spasticity we will check with his insurance company for approval. This was a 30 minute visit with greater than 50% of time spent in counseling and coordination of care. Return for follow-up in 3 months or call earlier if necessary.  Delia Heady, MD  [facility] [address] [place], [place] [address]  Phone [phone] Fax [phone]

## 2015-03-28 ENCOUNTER — Ambulatory Visit (INDEPENDENT_AMBULATORY_CARE_PROVIDER_SITE_OTHER): Payer: BLUE CROSS/BLUE SHIELD

## 2015-03-28 DIAGNOSIS — I651 Occlusion and stenosis of basilar artery: Secondary | ICD-10-CM | POA: Diagnosis not present

## 2015-03-30 ENCOUNTER — Telehealth: Payer: Self-pay

## 2015-03-30 NOTE — Telephone Encounter (Signed)
LVM for patient to call specialty pharmacy in order to have medication shipped to office.  He was provided with their contact information ([facility] [phone])

## 2015-04-02 ENCOUNTER — Ambulatory Visit: Payer: Self-pay | Admitting: Neurology

## 2015-04-06 ENCOUNTER — Ambulatory Visit (INDEPENDENT_AMBULATORY_CARE_PROVIDER_SITE_OTHER): Payer: BLUE CROSS/BLUE SHIELD | Admitting: Neurology

## 2015-04-06 ENCOUNTER — Encounter: Payer: Self-pay | Admitting: Neurology

## 2015-04-06 VITALS — BP 145/79 | HR 66 | Ht 71.0 in | Wt 219.0 lb

## 2015-04-06 DIAGNOSIS — G8111 Spastic hemiplegia affecting right dominant side: Secondary | ICD-10-CM | POA: Diagnosis not present

## 2015-04-06 NOTE — Progress Notes (Signed)
[facility]    PCC: Stroke  HPI: [name] [name] is a 59 y.o. male here as a referral from Dr. Parke Simmers for stroke. PMHx memory loss, stroke, hypertension 59 year old male with history of hypertension, history of DVT on Coumadin who was admitted to [facility] with an acute stroke involving a large area on the left side with residual right hemiplegia and dysphasia was discharged to skilled nursing facility. His brother is here and provides most information, patient is a poor historian. Brother dsays patient had a clot that traveled through his lungs to the posterior circulation and casued the stroke. He had a PEG tube placed in the hospital due to dysphagia. He has no records with him. He was going [facility] for yearly updates. He still has weakness and some dysarthria. He can barely walk without the cane. He has drooling which is worse. He is in physical therapy. Has been a year since the last speech therapy. He has some vision problems reading close up. 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 He is having frequent awakenings at night for unknown reasons, He lives alone, unclear if snores and is excessively tired during the day. They found him in his truck, they think he was there in the truck for 2-3 days after having the stroke, patient couldn't move and couldn't get out of the truck. He has gained a lot of weight recently.   Reviewed notes, labs and imaging from outside physicians, which showed:  LDL 56 HgbA1c 5.6   CT of the head 04/2013: CT HEAD FINDINGS  Right forehead scalp hematoma measuring up to 9 mm in thickness. Right frontal bone intact. Superior scalp soft tissues within normal limits. No calvarium fracture. Mastoids are clear.  Confluent and profound hypodensity in the left brainstem (series 2, image 8). Elsewhere posterior fossa gray-white matter differentiation  within normal limits. No midline shift, mass effect, or evidence of intracranial mass lesion. No ventriculomegaly. No acute intracranial hemorrhage identified. No evidence of cortically based acute infarction identified. No suspicious intracranial vascular hyperdensity.  Addendum: Received records from [facility]. 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 He has residual right-sided hemiparesis. He presented initially to an outside facility in August 2014 after being found down in his truck. CT of the head showed subacute left pontine infarct. NIH stroke scale of 30. 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 Patient was taken for a basilar thrombectomy. This did not reestablish flow. 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 He was started on anticoagulation due to the intra-arterial clot as well as a right upper extremity DVT. He has been continued on warfarin since then. INR goal 2-3.   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 Refractory to oral medications.  reviewed w/ pt the procedure of botulinum toxin, incl side effects - localized weakness, inj site rxn, myalgia and spread from site of injection   Procedure note   EMG: guidance was used to inject muscles detailed below. Aseptic procedure was performed and patient tolerated procedure. Procedure was performed by Dr. Azell Der   Right-sided muscles: Biceps 150 Flexor Digitorum profundus  50 Flexor Digitorum Sublimis 50 Flexor Carpi Radialis 50 Pronator Teres 50 Rectus Femoris 50  Units Injected 400 , Units wasted 0   Motrin / tylenol for injections site pain / soreness   REMS precautions handout given to patient   RTC - see  instructions for details, 6 weeks  Botox 400 units (100 units/2cc NS). Wasted 0 Units.   j code botox type A J0585  [id number] 100u x 4 Expiration date 11/2017   Naomie Dean, MD  [facility]  [address]  [place], [place] [address]  Phone [phone] Fax [phone]

## 2015-04-17 ENCOUNTER — Ambulatory Visit: Payer: BLUE CROSS/BLUE SHIELD | Admitting: Neurology

## 2015-05-04 ENCOUNTER — Telehealth: Payer: Self-pay

## 2015-05-04 NOTE — Telephone Encounter (Signed)
Rn call patient and told him that his carotid ultrasound study done in office was normal and transcranial Doppler study suggested only mild hardening of the blood vessels in the brain without any major blockage. No worrisome findings.Pt stated he understood the findings, and will call if he has any other concerns. He stated he will be seeing Dr Lucia Gaskins for follow up and will ask her when does he have to get the test again.

## 2015-05-04 NOTE — Telephone Encounter (Signed)
-----   Message from Micki Riley, MD sent at 04/30/2015  5:52 PM EDT ----- Joneen Roach inform the patient had carotid ultrasound study done in office was normal and transcranial Doppler study suggested only mild hardening of the blood vessels in the brain without any major blockage. No worrisome findings. Call if any questions

## 2015-05-07 ENCOUNTER — Ambulatory Visit (INDEPENDENT_AMBULATORY_CARE_PROVIDER_SITE_OTHER): Payer: BLUE CROSS/BLUE SHIELD | Admitting: Neurology

## 2015-05-07 ENCOUNTER — Encounter: Payer: Self-pay | Admitting: Neurology

## 2015-05-07 VITALS — BP 131/80 | HR 63 | Ht 71.0 in | Wt 223.6 lb

## 2015-05-07 DIAGNOSIS — G8111 Spastic hemiplegia affecting right dominant side: Secondary | ICD-10-CM

## 2015-05-07 DIAGNOSIS — G8110 Spastic hemiplegia affecting unspecified side: Secondary | ICD-10-CM | POA: Diagnosis not present

## 2015-05-07 NOTE — Progress Notes (Signed)
[facility]    [facility]    PCC: Stroke  Interval Update: He doesn't feel like his functionality is better after the botox. Discussion about Botox and expectations. Patient has a lot of right shoulder pain and explained to him again that Botox will not help with the right shoulder pain. Botox can help with spasticity to increase range of motion and decrease pain. Increased tone can lead to secondary problems such as fixed contractures and bony deformities, causing motor dysfunction. Botox will not improve muscle strength or repair weakness after stroke.    HPI: [name] [name] is a 59 y.o. male here as a referral from Dr. Parke Simmers for stroke. PMHx memory loss, stroke, hypertension 59 year old male with history of hypertension, history of DVT on Coumadin who was admitted to [facility] with an acute stroke involving a large area on the left side with residual right hemiplegia and dysphasia was discharged to skilled nursing facility. His brother is here and provides most information, patient is a poor historian. Brother dsays patient had a clot that traveled through his lungs to the posterior circulation and casued the stroke. He had a PEG tube placed in the hospital due to dysphagia. He has no records with him. He was going to [facility] for yearly updates. He still has weakness and some dysarthria. He can barely walk without the cane. He has drooling which is worse. He is in physical therapy. Has been a year since the last speech therapy. He has some vision problems reading close up. 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 He is having frequent awakenings at night for unknown reasons, He lives alone, unclear if snores and is excessively tired during the day. They found him in his truck, they think he was there in the truck for 2-3 days after having the stroke, patient  couldn't move and couldn't get out of the truck. He has gained a lot of weight recently.   Reviewed notes, labs and imaging from outside physicians, which showed:  LDL 56 HgbA1c 5.6   CT of the head 04/2013: CT HEAD FINDINGS  Right forehead scalp hematoma measuring up to 9 mm in thickness. Right frontal bone intact. Superior scalp soft tissues within normal limits. No calvarium fracture. Mastoids are clear.  Confluent and profound hypodensity in the left brainstem (series 2, image 8). Elsewhere posterior fossa gray-white matter differentiation within normal limits. No midline shift, mass effect, or evidence of intracranial mass lesion. No ventriculomegaly. No acute intracranial hemorrhage identified. No evidence of cortically based acute infarction identified. No suspicious intracranial vascular hyperdensity.  Addendum: Received records from [facility]. 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 He has residual right-sided hemiparesis. He presented initially to an outside facility in August 2014 after being found down in his truck. CT of the head showed subacute left pontine infarct. NIH stroke scale of 30. 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 Patient was taken for a basilar thrombectomy. This did not reestablish flow. 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 He was started on anticoagulation due to the intra-arterial clot as well as a right upper extremity DVT. He has been continued  on warfarin since then. INR goal 2-3.  Review of Systems: Patient complains of symptoms per HPI as well as the following symptoms: No CP, no SOB. Pertinent negatives per HPI. All others negative.   Social History   Social History  . Marital Status: Married     Spouse Name: N/A  . Number of Children: 0  . Years of Education: 12   Occupational History  . umemployed    Social History Main Topics  . Smoking status: Never Smoker   . Smokeless tobacco: Not on file  . Alcohol Use: No  . Drug Use: No  . Sexual Activity: Yes   Other Topics Concern  . Not on file   Social History Narrative   Lives at home by self.    Single.   Caffeine use: daily    Family History  Problem Relation Age of Onset  . High blood pressure    . Diabetes    . Heart Problems    . Heart Problems Mother   . Stroke Father     Past Medical History  Diagnosis Date  . Dysphagia   . DVT (deep venous thrombosis)   . Hypertension   . Depression   . 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 Enlarged prostate     Past Surgical History  Procedure Laterality Date  . Esophagoscopy w/ percutaneous gastrostomy tube placement  04/12/2013    Dr Murrell Converse, [facility]    Current Outpatient Prescriptions  Medication Sig Dispense Refill  . acetaminophen (TYLENOL) 160 MG/5ML liquid Take 640 mg by mouth every 4 (four) hours as needed for fever.     [name] atorvastatin (LIPITOR) 40 MG tablet 40 mg by PEG Tube route daily.    . clopidogrel (PLAVIX) 75 MG tablet Take 1 tablet (75 mg total) by mouth daily. 30 tablet 11  . doxazosin (CARDURA) 1 MG tablet Take 1 tablet by mouth daily.  0  . QUEtiapine (SEROQUEL) 25 MG tablet Take 25 mg by mouth at bedtime.     . sertraline (ZOLOFT) 25 MG tablet Take 25 mg by mouth daily.      No current facility-administered medications for this visit.    Allergies as of 05/07/2015  . (No Known Allergies)    Vitals: BP 131/80 mmHg  Pulse 63  Ht  (1.803 m)  Wt 223 lb 9.6 oz (101.424 kg)  BMI 31.20 kg/m2 Last Weight:  Wt Readings from Last 1 Encounters:  05/07/15 223 lb 9.6 oz (101.424 kg)   Last  Height:   Ht Readings from Last 1 Encounters:  05/07/15  (1.803 m)     0 No increase in tone  1 Slight increase in muscle tone, manifested by a catch and release or minimal resistance at the end of the ROM when the affected part(s) is moved in flexion or extension  1+ Slight increase in muscle tone, manifested by a catch, followed by minimal resistance throughout the remainder (less than half) of the ROM  2 More marked increase in muscle tone through most of the ROM, but affected part(s) easily moved  3 Considerable increase in muscle tone, passive movement difficult  4 Affected part(s) rigid in flexion or extension  Mildly increased tone in the finger flexors 1+ Increased tone in the biceps 2 Pronator Teres 3  Assessment/Plan:   These are patient's second  injections for 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 Refractory to oral medications. Will make modifications to aid in increased ROM and decreased pain.  Next Plan:  Right-sided muscles: Biceps 200 Flexor Digitorum profundus 50 Flexor Digitorum Sublimis 50 Flexor Carpi Radialis 50 Pronator Teres 75 Rectus Femoris 50  Plan 475   Will refer to PT for right-sided spastic hemiplegia. S/p botox.  Needs stretching after botox and also evaluation of functionality. Would like to work with physcial therapists to develop further botox plans.   Naomie Dean, MD  [facility] [address] [place], [place] [address]  Phone [phone] Fax [phone]  A total of 30 minutes was spent face-to-face with this patient. Over half this time was spent on counseling patient on the spastic hemiplegia diagnosis and different diagnostic and therapeutic options available.

## 2015-05-07 NOTE — Patient Instructions (Signed)
Overall you are doing fairly well but I do want to suggest a few things today:   Remember to drink plenty of fluid, eat healthy meals and do not skip any meals. Try to eat protein with a every meal and eat a healthy snack such as fruit or nuts in between meals. Try to keep a regular sleep-wake schedule and try to exercise daily, particularly in the form of walking, 20-30 minutes a day, if you can.   As far as your medications are concerned, I would like to suggest: continue current medications  I would like to see you back for botox, sooner if we need to. Please call us with any interim questions, concerns, problems, updates or refill requests.   Our phone number is [phone]. We also have an after hours call service for urgent matters and there is a physician on-call for urgent questions. For any emergencies you know to call 911 or go to the nearest emergency room

## 2015-05-29 ENCOUNTER — Other Ambulatory Visit: Payer: Self-pay | Admitting: *Deleted

## 2015-05-29 DIAGNOSIS — G8111 Spastic hemiplegia affecting right dominant side: Secondary | ICD-10-CM

## 2015-05-29 MED ORDER — ONABOTULINUMTOXINA 100 UNITS IJ SOLR: INTRAMUSCULAR | Status: DC

## 2015-05-30 ENCOUNTER — Telehealth: Payer: Self-pay | Admitting: Neurology

## 2015-05-30 NOTE — Telephone Encounter (Signed)
Spoke with the patient regarding his botox inj. He requested that I call him back at a later time to schedule his next inj. Will call him back in a few weeks. Need to call pharmacy and change medication from 600 units to 500 units.

## 2015-06-05 ENCOUNTER — Encounter: Payer: Self-pay | Admitting: Physical Therapy

## 2015-06-05 ENCOUNTER — Ambulatory Visit: Payer: BLUE CROSS/BLUE SHIELD | Attending: Neurology | Admitting: Physical Therapy

## 2015-06-05 ENCOUNTER — Ambulatory Visit: Payer: BLUE CROSS/BLUE SHIELD | Admitting: Occupational Therapy

## 2015-06-05 DIAGNOSIS — IMO0002 Reserved for concepts with insufficient information to code with codable children: Secondary | ICD-10-CM

## 2015-06-05 DIAGNOSIS — I69898 Other sequelae of other cerebrovascular disease: Secondary | ICD-10-CM | POA: Insufficient documentation

## 2015-06-05 DIAGNOSIS — R2681 Unsteadiness on feet: Secondary | ICD-10-CM

## 2015-06-05 DIAGNOSIS — R252 Cramp and spasm: Secondary | ICD-10-CM

## 2015-06-05 DIAGNOSIS — M62838 Other muscle spasm: Secondary | ICD-10-CM

## 2015-06-05 DIAGNOSIS — R278 Other lack of coordination: Secondary | ICD-10-CM

## 2015-06-05 DIAGNOSIS — R279 Unspecified lack of coordination: Secondary | ICD-10-CM | POA: Insufficient documentation

## 2015-06-05 DIAGNOSIS — M6249 Contracture of muscle, multiple sites: Secondary | ICD-10-CM | POA: Diagnosis present

## 2015-06-05 DIAGNOSIS — R258 Other abnormal involuntary movements: Secondary | ICD-10-CM | POA: Insufficient documentation

## 2015-06-05 DIAGNOSIS — I69319 Unspecified symptoms and signs involving cognitive functions following cerebral infarction: Secondary | ICD-10-CM

## 2015-06-05 DIAGNOSIS — I69359 Hemiplegia and hemiparesis following cerebral infarction affecting unspecified side: Secondary | ICD-10-CM

## 2015-06-05 DIAGNOSIS — R269 Unspecified abnormalities of gait and mobility: Secondary | ICD-10-CM | POA: Diagnosis present

## 2015-06-05 DIAGNOSIS — R531 Weakness: Secondary | ICD-10-CM | POA: Insufficient documentation

## 2015-06-05 NOTE — Patient Instructions (Signed)
Laying on your back gently clasp hands together , reach for the ceiling then slowly lower arms to chest 2 sets of 10 reps, 2x day  Do not lift anything heavy, or weights with right arm Do not use rope to pull arm up

## 2015-06-05 NOTE — Therapy (Signed)
[facility] [address] [place], [place], [address] Phone: [phone]   Fax:  [phone]  Physical Therapy Evaluation  Patient Details  Name: [name] NissenRobert [name] MRN: [id number] Date of Birth: 1956/05/23 Referring Provider: Naomie DeanAntonia Ahern, MD  Encounter Date: 06/05/2015      PT End of Session - 06/05/15 1913    Visit Number 1   Number of Visits 17  eval + 16 visits   Date for PT Re-Evaluation 08/04/15   Authorization Type [employer]; 20 visit limit for PT   Authorization - Visit Number 1   Authorization - Number of Visits 20   PT Start Time 1451  Pt arrived late to session   PT Stop Time 1530   PT Time Calculation (min) 39 min   Activity Tolerance Patient tolerated treatment well   Behavior During Therapy [facility] for tasks assessed/performed      Past Medical History  Diagnosis Date  . Dysphagia   . DVT (deep venous thrombosis) (HCC)   . Hypertension   . Depression   . 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 Enlarged prostate     Past Surgical History  Procedure Laterality Date  . Esophagoscopy w/ percutaneous gastrostomy tube placement  04/12/2013    [facility]    There were no vitals filed for this visit.  Visit Diagnosis:  Abnormality of gait - Plan: PT plan of care cert/re-cert  Unsteadiness - Plan: PT plan of care cert/re-cert  Spasticity - Plan: PT plan of care cert/re-cert  Hemiparesis affecting dominant side as late effect of cerebrovascular accident [facility](HCC) - Plan: PT plan of care cert/re-cert      Subjective Assessment - 06/05/15 1502    Subjective "I think I'm here related to the stroke. I still have not recovered. I still feel like I am unable to do some of the things I had been doing.[name].[name].I have to depend on other people." Prior to CVA in August 2014, pt  was completely independent with all mobility (no AD) and working FT as a Naval architecttruck driver. Currently, pt uses quad cane for mobility. 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 Pt's brother's goals: to improve safety awareness, to increase pt safety going up stairs with cane.   Currently in Pain? No/denies            [facility] - 06/05/15 0001    Assessment   Medical Diagnosis CVA   Referring Provider Naomie DeanAntonia Ahern, MD   Onset Date/Surgical Date 03/31/13   Hand Dominance Right   Precautions   Precautions Fall   Required Braces or Orthoses Other Brace/Splint   Other Brace/Splint custom solid AFO for R ankle   Restrictions   Weight Bearing Restrictions No   Balance Screen   Has the patient fallen in the past 6 months No   Has the patient had a decrease in activity level because of a fear of falling?  Yes   Is the patient  reluctant to leave their home because of a fear of falling?  Yes   Home Environment   Living Environment Private residence   Living Arrangements Alone   Type of Home House   Home Access Stairs to enter   Entrance Stairs-Number of Steps 4   Entrance Stairs-Rails Can reach both   Home Layout One level   Additional Comments Per brother, pt has difficulty negotiating stairs to enter home while carrying cane.   Prior Function   Level of Independence Independent   Vocation Full time employment   Vocation Requirements truck driver   Cognition   Overall Cognitive Status Impaired/Different from baseline   Observation/Other Assessments   Observations R LE extensor synergy   Sensation   Light Touch  Appears Intact  BLE's   Coordination   Gross Motor Movements are Fluid and Coordinated No   Heel Shin Test Unable to formally assess due to inability to selectively control R knee flexion in seated   Tone   Assessment Location Right Lower Extremity   ROM / Strength   AROM / PROM / Strength Strength   Strength   Overall Strength Deficits   Strength Assessment Site Hip;Knee;Ankle   Right/Left Hip Right   Right Hip Flexion 4-/5   Right/Left Knee Right   Right Knee Flexion 3-/5   Right Knee Extension 4/5   Right/Left Ankle Right   Right Ankle Dorsiflexion 0/5   Right Ankle Plantar Flexion 2-/5   Transfers   Transfers Sit to Stand;Stand to Sit   Sit to Stand 5: Supervision   Stand to Sit 5: Supervision   Ambulation/Gait   Ambulation/Gait Yes   Ambulation/Gait Assistance 5: Supervision   Ambulation Distance (Feet) 120 Feet   Assistive device Small based quad cane;Other (Comment)  custom solid R AFO   Gait Pattern Step-through pattern;Decreased arm swing - right;Decreased step length - right;Decreased hip/knee flexion - right;Decreased stance time - right;Decreased dorsiflexion - right;Decreased weight shift to right;Right circumduction;Abducted- right;Trunk rotated posteriorly on right;Right foot flat   Ambulation Surface Level;Indoor   Gait velocity 1.23 ft/sec   Standardized Balance Assessment   Standardized Balance Assessment Timed Up and Go Test   Timed Up and Go Test   TUG Normal TUG   Normal TUG (seconds) 25.1  using SBQC   RLE Tone   RLE Tone Severe;Other (comment);Modified Ashworth   RLE Tone   Modified Ashworth Scale for Grading Hypertonia RLE Considerable increase in muschle tone, passive movement difficult  R quadriceps, R hamstrings                           PT Education - 06/05/15 1911    Education provided Yes   Education Details PT eval findings, goals, and POC.   Person(s) Educated Patient;Other (comment)   brother   Methods Explanation    Comprehension Verbalized understanding          PT Short Term Goals - 06/05/15 1928    PT SHORT TERM GOAL #1   Title Pt will perform initial HEP with mod I using paper handout to maximize functional gains made in PT. Target date:07/03/15   PT SHORT TERM GOAL #2   Title Complete Berg and improve score by 5 points from baseline to indicate improved functional standing balance. Target date:07/03/15   PT SHORT TERM GOAL #3   Title Pt will decrease TUG from 25.10 sec to < 20 sec to indicate increased efficiency of mobility. Target date:07/03/15  PT SHORT TERM GOAL #4   Title Pt will increase gait velocity from 1.23 ft/sec to 1.53 ft/sec to indicate increased efficiency of ambulation. Target date:07/03/15   PT SHORT TERM GOAL #5   Title Pt will negotiate 4 stairs with 2 rails (either side to simulate home entrance) while safely managing SPC to indicate increased safety using primary entrance of home.  Target date:07/03/15   Additional Short Term Goals   Additional Short Term Goals Yes   PT SHORT TERM GOAL #6   Title Pt will ambulate 200' over level, indoor surfaces with mod I with proper use of LRAD to indicate increased safety with household mobility, safe use of AD. Target date:07/03/15           PT Long Term Goals - 06/05/15 1951    PT LONG TERM GOAL #1   Title Pt will verbalize understanding of CVA warning signs, pertinent risk factors to prevent future CVA. Target date: 07/31/15   PT LONG TERM GOAL #2   Title Pt will verbalize understanding of fall prevention strategies to decrease fall risk in home environment. Target date: 07/31/15   PT LONG TERM GOAL #3   Title Pt will negotiate standard ramp and curb step with mod I using LRAD to increase pt safety/independence traversing community obstacles. Target date: 07/31/15   PT LONG TERM GOAL #4   Title Pt will ambulate 500' over unlevel, paved surfaces with mod I using LRAD to indicate increased safety with limited community  mobility. Target date: 07/31/15   PT LONG TERM GOAL #5   Title Pt will increase gait velocity from 1.23 ft/sec to > / = 1.83 ft/sec to indicate decreased risk of recurrent falls. Target date: 07/31/15   Additional Long Term Goals   Additional Long Term Goals Yes   PT LONG TERM GOAL #6   Title Pt will improve Berg score by 8 points from baseline to indicate significant improvement in functional standing balance. Target date: 07/31/15   PT LONG TERM GOAL #7   Title Pt will decrease TUG time from 25.10 sec to < 15 seconds to indicate decreased fall risk. Target date: 07/31/15               Plan - 06/05/15 1916    Clinical Impression Statement Pt is a 59 y/o M referred to outpatient PT to address functional impairments associated with B pontine infarct (L > R) on 03/31/13. Pt hospitalized at [facility] from 8/21 - 04/20/13. Prior to CVA, pt was completely independent with all aspects of mobility and working full-time. PT evaluation reveals the following impairments: R hemiplegia; significant RLE spasticity; gait impairments; gait speed and TUG indicative of risk of recurrent falls; decreased selective control in RLE. Pt will benefit from skilled outpatient PT 2x/week for 8 weeks to address said impairments.     Pt will benefit from skilled therapeutic intervention in order to improve on the following deficits Abnormal gait;Decreased cognition;Decreased knowledge of use of DME;Decreased coordination;Decreased mobility;Decreased endurance;Decreased activity tolerance;Decreased balance;Decreased range of motion;Decreased safety awareness;Impaired UE functional use;Decreased strength;Impaired tone;Impaired flexibility   Rehab Potential Good   Clinical Impairments Affecting Rehab Potential Transportation limitations (pt does not drive; brother works FT)   PT Frequency 2x / week   PT Duration 8 weeks   PT Treatment/Interventions DME Instruction;Electrical Stimulation;Gait training;Stair  training;Functional mobility training;Therapeutic activities;Therapeutic exercise;Balance training;Patient/family education;Orthotic Fit/Training;Neuromuscular re-education;Manual techniques;Vestibular   PT Next Visit Plan Perform Berg; initiate HEP; RLE PNF to address extensor synergy   PT  Home Exercise Plan RLE stretching program; increase selective control/decrease synergistic movement in RLE   Recommended Other Services If/when pt able to lift body weight with R ankle PF, consider contacting [employer] (who made pt's current AFO) to modify solid back to articulated back due to return of R ankle PF   Consulted and Agree with Plan of Care Patient;Family member/caregiver   Family Member Consulted brother, Aywood         Problem List Patient Active Problem List   Diagnosis Date Noted  . OSA (obstructive sleep apnea) 10/11/2014  . Excessive daytime sleepiness 10/11/2014  . Hemiparesis (HCC) 10/11/2014  . Dysarthria 10/11/2014  . Pneumoperitoneum 05/01/2013  . Lower GI bleed 05/01/2013  . UTI (urinary tract infection) 05/01/2013  . Supratherapeutic INR 05/01/2013  . Chronic indwelling Foley catheter 05/01/2013  . Jejunostomy tube present (?originally G tube?) 05/01/2013  . Laceration of eyebrow s/p repair 04/26/2013 05/01/2013  . 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 Hemiparesis affecting right side as late effect of stroke (HCC) 05/01/2013  . 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 DVT of right axillary vein, acute 05/01/2013  . CVA (cerebral vascular accident) (HCC) 05/01/2013  . Anemia 05/01/2013  . Dysphagia    Jorje Guild, PT, DPT [facility] [address] [place], [place], [address] Phone: [phone]   Fax:  [phone] 06/05/2015, 8:12 PM   Name: Gagan Dillion MRN: [id number] Date of Birth: Aug 10, 1956

## 2015-06-06 NOTE — Therapy (Signed)
[facility] [address] [place], [place], [address] Phone: [phone]   Fax:  [phone]  Occupational Therapy Treatment  Patient Details  Name: [name] [name] MRN: [id number] Date of Birth: [birth year] Referring Provider: Dr. Lucia Gaskins  Encounter Date: 06/05/2015      OT End of Session - 06/06/15 1133    Visit Number 1   Number of Visits 17   Date for OT Re-Evaluation 08/03/15   Authorization Type BCBS   Authorization Time Period 20 visit limit   Authorization - Visit Number 1   Authorization - Number of Visits 20   OT Start Time 1535   OT Stop Time 1625   OT Time Calculation (min) 50 min   Activity Tolerance Patient tolerated treatment well   Behavior During Therapy [facility] for tasks assessed/performed      Past Medical History  Diagnosis Date  . Dysphagia   . DVT (deep venous thrombosis) (HCC)   . Hypertension   . Depression   . 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 Enlarged prostate     Past Surgical History  Procedure Laterality Date  . Esophagoscopy w/ percutaneous gastrostomy tube placement  04/12/2013    Dr Murrell Converse, [facility]    There were no vitals filed for this visit.  Visit Diagnosis:  Weakness due to cerebrovascular accident - Plan: Ot plan of care cert/re-cert  Muscle spasticity - Plan: Ot plan of care cert/re-cert  Decreased coordination - Plan: Ot plan of care cert/re-cert  Cognitive deficits following cerebral infarction - Plan: Ot plan of care cert/re-cert      Subjective Assessment - 06/05/15 1537    Subjective  Pt reports he had CVA in 2014, botox approximately 1 month ago   Pertinent History see epic    Patient Stated Goals to use arm again, write name   Currently in Pain? No/denies   Multiple Pain Sites No            [facility] OT  Assessment - 06/06/15 0001    Assessment   Diagnosis CVA   Referring Provider Dr. Lucia Gaskins   Onset Date 05/07/15   s/p botox injection, CVA 2014   Assessment Pt s/p botox injections 05/07/15 to RUE. Initial CVA was in 2014. Pt presents with spasticity and weakness which limits functional use of RUE for ADLs/ IADLs.   Prior Therapy SNF   Precautions   Precautions Fall   Required Braces or Orthoses Other Brace/Splint   Other Brace/Splint custom solid AFO for R ankle   Restrictions   Weight Bearing Restrictions No   Home  Environment   Family/patient expects to be discharged to: Private residence   Lives With Alone  family assists PRN   Prior Function   Level of Independence Independent   Vocation Full time employment   Vocation Requirements truck driver   ADL   ADL comments Pt  performing all basic ADLs modified independently.   IADL   Light Housekeeping --  To be assessed   Meal Prep --  To be assessed   Mobility   Mobility Status --   modified independent with quad cane with incr.fall risk   Written Expression   Dominant Hand Right   Handwriting --  unable per pt report   Cognition   Overall Cognitive Status Impaired/Different from baseline  to be further assessed  in functional context   Sensation   Light Touch Appears Intact   Coordination   Gross Motor Movements are Fluid and Coordinated No   Fine Motor Movements are Fluid and Coordinated No   Box and Blocks RUE 5 blocks, LUE NT   Tone   Assessment Location Right Upper Extremity   ROM / Strength   AROM / PROM / Strength AROM   AROM   Overall AROM  Deficits   AROM Assessment Site Shoulder;Elbow;Forearm;Finger   Right/Left Shoulder Right   Right Shoulder Flexion 45 Degrees   Right Shoulder ABduction 45 Degrees   Right/Left Elbow Right;Left   Right Elbow Flexion 110   Right Elbow Extension -30   Left Elbow Flexion --   Left Elbow Extension --   Right/Left Forearm Right   Right Forearm Pronation 65 Degrees    Right Forearm Supination 40 Degrees   Right/Left Finger Right   Right Composite Finger Flexion --  flexion 85%/ extension 70%   Hand Function   Right Hand Gross Grasp Functional   Right Hand Grip (lbs) 23 lbs   Left Hand Grip (lbs) 70 lbs   RLE Tone   RLE Tone Moderate                          OT Education - 06/06/15 1132    Education provided Yes   Education Details inital execise in supine, activities to avoid(no pulleys and no weightlifting with RUE).   Person(s) Educated Patient;Other (comment)  brother   Methods Explanation;Demonstration;Verbal cues;Handout   Comprehension Verbalized understanding;Returned demonstration          OT Short Term Goals - 06/06/15 1142    OT SHORT TERM GOAL #1   Title I with HEP.   Time 4   Period Weeks   Status New   OT SHORT TERM GOAL #2   Title Pt will verbalize understanding of RUE positioning to minimize risk for injury, pain  and contracture.   Time 4   Period Weeks   Status New   OT SHORT TERM GOAL #3   Title Pt will demonstrate A/ROM shoulder flexion of 60* in prep for functional reach.   Time 4   Period Weeks   Status New   OT SHORT TERM GOAL #4   Title Pt will demonstrate ability to write his name with 70% legibility   Time 4   Period Weeks   Status New           OT Long Term Goals - 06/06/15 1143    OT LONG TERM GOAL #1   Title Pt will demonstrate ability to perform light home management / cooking modified independently demonstrating good safety awareness.   Time 8   Period Weeks   Status New   OT LONG TERM GOAL #2   Title Pt will report ability to use RUE as an active assist at least 50% of the time for light home management/ ADLs.   Time 8   Period Weeks   Status New   OT LONG TERM GOAL #3   Title Pt will demonstrate improved RUE functional use as evidenced by performing 10 blocks on box/ blocks with RUE.   Time 8   Period Weeks   Status New   OT LONG TERM GOAL #4   Title Pt will  demonstrate ability to write his name with 90% legibility   Time 8   Period Weeks   Status New  Plan - 06/06/15 1135    Clinical Impression Statement Pt s/p CVA in 2014 presents since botox approximately 1 month ago to RUE. Pt can benefit from skilled occupational  therapy to maximize RUE functional use for ADLs/ IADLs.   Pt will benefit from skilled therapeutic intervention in order to improve on the following deficits (Retired) Abnormal gait;Decreased coordination;Decreased range of motion;Difficulty walking;Impaired flexibility;Decreased endurance;Decreased safety awareness;Increased edema;Impaired sensation;Obesity;Decreased balance;Decreased knowledge of use of DME;Impaired UE functional use;Pain;Impaired perceived functional ability;Decreased strength;Decreased mobility;Decreased cognition;Impaired tone;Decreased activity tolerance;Decreased knowledge of precautions   Rehab Potential Good   Clinical Impairments Affecting Rehab Potential spasticity   OT Frequency 2x / week  plus eval   OT Duration 8 weeks   OT Treatment/Interventions Self-care/ADL training;Therapeutic exercise;Patient/family education;Balance training;Splinting;Manual Therapy;Neuromuscular education;Ultrasound;Iontophoresis;Therapeutic exercises;Therapeutic activities;DME and/or AE instruction;Parrafin;Cryotherapy;Electrical Stimulation;Fluidtherapy;Gait Training;Cognitive remediation/compensation;Visual/perceptual remediation/compensation;Passive range of motion;Contrast Bath;Moist Heat   Plan progress HEP for RUE   Consulted and Agree with Plan of Care Patient;Family member/caregiver   Family Member Consulted brother        Problem List Patient Active Problem List   Diagnosis Date Noted  . OSA (obstructive sleep apnea) 10/11/2014  . Excessive daytime sleepiness 10/11/2014  . Hemiparesis (HCC) 10/11/2014  . Dysarthria 10/11/2014  . Pneumoperitoneum 05/01/2013  . Lower GI bleed 05/01/2013  .  UTI (urinary tract infection) 05/01/2013  . Supratherapeutic INR 05/01/2013  . Chronic indwelling Foley catheter 05/01/2013  . Jejunostomy tube present (?originally G tube?) 05/01/2013  . Laceration of eyebrow s/p repair 04/26/2013 05/01/2013  . 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 Hemiparesis affecting right side as late effect of stroke (HCC) 05/01/2013  . 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 DVT of right axillary vein, acute 05/01/2013  . CVA (cerebral vascular accident) (HCC) 05/01/2013  . Anemia 05/01/2013  . Dysphagia     Lilley Hubble 06/06/2015, 1:08 PM Keene BreathKathryn Misako Roeder, OTR/L [phone] Phone: [phone] 1:08 PM 06/06/2015 [facility] [address] [place], [place], [address] Phone: [phone]   Fax:  [phone]  Name: [name] NissenRobert [name] MRN: [id number] Date of Birth: 10/29/1955

## 2015-06-11 ENCOUNTER — Ambulatory Visit: Payer: BLUE CROSS/BLUE SHIELD | Admitting: Physical Therapy

## 2015-06-11 DIAGNOSIS — R269 Unspecified abnormalities of gait and mobility: Secondary | ICD-10-CM

## 2015-06-11 DIAGNOSIS — R279 Unspecified lack of coordination: Secondary | ICD-10-CM

## 2015-06-11 DIAGNOSIS — I69359 Hemiplegia and hemiparesis following cerebral infarction affecting unspecified side: Secondary | ICD-10-CM

## 2015-06-11 DIAGNOSIS — R278 Other lack of coordination: Secondary | ICD-10-CM

## 2015-06-11 DIAGNOSIS — M62838 Other muscle spasm: Secondary | ICD-10-CM

## 2015-06-11 NOTE — Therapy (Signed)
[facility] [address] [place], [place], [address] Phone: [phone]   Fax:  [phone]  Physical Therapy Treatment  Patient Details  Name: [name] [name] MRN: [id number] Date of Birth: 06-08-1956 Referring Provider: Naomie Dean, MD  Encounter Date: 06/11/2015      PT End of Session - 06/11/15 0912    Visit Number 2   Number of Visits 17   Date for PT Re-Evaluation 08/04/15   Authorization Type BCBS; 20 visit limit for PT   Authorization - Visit Number 2   Authorization - Number of Visits 20   PT Start Time 0803   PT Stop Time 0848   PT Time Calculation (min) 45 min   Activity Tolerance Patient tolerated treatment well   Behavior During Therapy [facility] for tasks assessed/performed      Past Medical History  Diagnosis Date  . Dysphagia   . DVT (deep venous thrombosis) (HCC)   . Hypertension   . Depression   . 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 Enlarged prostate     Past Surgical History  Procedure Laterality Date  . Esophagoscopy w/ percutaneous gastrostomy tube placement  04/12/2013    Dr Murrell Converse, [facility]    There were no vitals filed for this visit.  Visit Diagnosis:  Abnormality of gait  Hemiparesis affecting dominant side as late effect of cerebrovascular accident [facility])  Muscle spasticity  Decreased coordination      Subjective Assessment - 06/11/15 0811    Subjective Pt with many questions today regarding possible R AFO modifications, reason for impairments, if he can walk outside in grass, if he can use a rowing machine, difference in PT/OT, and whether or not he is "outside the window of opportunity" for functional improvement.   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 Pt's brother's goals: to improve safety awareness, to increase pt safety going up stairs with cane.   Currently in Pain? No/denies                         [facility] Adult PT Treatment/Exercise - 06/11/15 0001    Transfers   Transfers Sit to Stand;Stand to Sit   Sit to Stand 5: Supervision   Sit to Stand Details (indicate cue type and reason) Cueing for setup (symmetrical LE placement) and technique to increase RLE weightbearing.   Stand to Sit 5: Supervision   Ambulation/Gait   Ambulation/Gait Yes   Ambulation/Gait Assistance 5: Supervision   Ambulation Distance (Feet) 130 Feet  x2   Assistive device Small based quad cane;Other (Comment)  custom solid R AFO   Gait Pattern Step-through pattern;Decreased arm swing - right;Decreased step length - right;Decreased hip/knee flexion - right;Decreased stance time - right;Decreased weight shift to right;Right circumduction;Abducted- right;Trunk rotated posteriorly on right;Right foot flat;Decreased dorsiflexion - right;Trunk flexed   Ambulation Surface Level;Indoor   Gait Comments Verbal, demonstration, and HOH cueing for proper use of SBQC; focued on decreasing distance of SBQC from pt during gait to maintain upright posture, normalize weight shift (lateral and anterior),  and decrease spasticity in RUE/LE. Inconistent within-session carryover when pt dual-tasking (asking PT questions during gait training).   Standardized Balance Assessment   Standardized Balance Assessment Berg Balance Test   Berg Balance Test   Sit to Stand Able to stand without using hands and stabilize independently   Standing Unsupported Able to stand safely 2 minutes   Sitting with Back Unsupported but Feet Supported on Floor or Stool Able to sit safely and  securely 2 minutes   Stand to Sit Sits safely with minimal use of hands   Transfers Able to transfer safely, minor use of hands   Standing Unsupported with Eyes Closed Able to stand 10 seconds safely   Standing Ubsupported with Feet Together Able to place feet together independently and stand 1 minute safely   From Standing, Reach Forward with Outstretched Arm Can reach forward >12 cm safely (5")   From Standing Position, Pick up Object from Floor Able to pick up shoe safely and easily   From Standing Position, Turn to Look Behind Over each Shoulder Looks behind one side only/other side shows less weight shift   Turn 360 Degrees Needs close supervision or verbal cueing   Standing Unsupported, Alternately Place Feet on Step/Stool Needs assistance to keep from falling or unable to try   Standing Unsupported, One Foot in Front Able to take small step independently and hold 30 seconds   Standing on One Leg Tries to lift leg/unable to hold 3 seconds but remains standing independently   Total Score 42                PT Education - 06/11/15 0858    Education provided Yes   Education Details Sharlene Motts findings, fall risk. Spasticity, synergistic movement after CVA. Role of PT/OT. Safe use of SBQC. Possible AFO modifications. Recommending pt not walk over grass until assessed in PT and determined to be safe.   Person(s) Educated Patient;Other (comment)  brother   Methods Explanation   Comprehension Verbalized understanding          PT Short Term Goals - 06/11/15 0924    PT SHORT TERM GOAL #1   Title Pt will perform initial HEP with mod I using paper handout to maximize functional gains made in PT. Target date:07/03/15   PT SHORT TERM GOAL #2   Title Complete Berg and improve score by 5 points from baseline to indicate improved functional standing balance. Target date:07/03/15   Baseline 10/31: baseline Berg score = 42/56   PT SHORT TERM GOAL #3   Title Pt will decrease TUG from 25.10 sec  to < 20 sec to indicate increased efficiency of mobility. Target date:07/03/15   PT SHORT TERM GOAL #4   Title Pt will increase gait velocity from 1.23 ft/sec to 1.53 ft/sec to indicate increased efficiency of ambulation. Target date:07/03/15   PT SHORT TERM GOAL #5   Title Pt will negotiate 4 stairs with 2 rails (either side to simulate home entrance) while safely managing SPC to indicate increased safety using primary entrance of home.  Target date:07/03/15   PT SHORT TERM GOAL #6   Title Pt will ambulate 200' over level, indoor surfaces with mod I with proper use of LRAD to indicate increased safety with household mobility, safe use of AD. Target date:07/03/15           PT Long Term Goals - 06/11/15 0925    PT LONG TERM GOAL #1   Title Pt will verbalize understanding of CVA warning  signs, pertinent risk factors to prevent future CVA. Target date: 07/31/15   PT LONG TERM GOAL #2   Title Pt will verbalize understanding of fall prevention strategies to decrease fall risk in home environment. Target date: 07/31/15   PT LONG TERM GOAL #3   Title Pt will negotiate standard ramp and curb step with mod I using LRAD to increase pt safety/independence traversing community obstacles. Target date: 07/31/15   PT LONG TERM GOAL #4   Title Pt will ambulate 500' over unlevel, paved surfaces with mod I using LRAD to indicate increased safety with limited community mobility. Target date: 07/31/15   PT LONG TERM GOAL #5   Title Pt will increase gait velocity from 1.23 ft/sec to > / = 1.83 ft/sec to indicate decreased risk of recurrent falls. Target date: 07/31/15   PT LONG TERM GOAL #6   Title Pt will improve Berg score by 8 points from baseline to indicate significant improvement in functional standing balance. Target date: 07/31/15   Baseline 10/31: baseline Berg score = 42/56   PT LONG TERM GOAL #7   Title Pt will decrease TUG time from 25.10 sec to < 15 seconds to indicate decreased fall risk. Target  date: 07/31/15               Plan - 06/11/15 0912    Clinical Impression Statement Session focused on assessing/addressing functional standing balance and initiating education/cueing to promote more symmetrical weightbearing/weight shifting during transitional movements and ambulation. Berg score 42/56, suggesting significant fall risk. Pt very motivated and had many detailed questions related to functional expectations for therapy. If pt does not appear to carry over education provided, may need to educate brother and redirect pt during PT session.   Pt will benefit from skilled therapeutic intervention in order to improve on the following deficits Abnormal gait;Decreased cognition;Decreased knowledge of use of DME;Decreased coordination;Decreased mobility;Decreased endurance;Decreased activity tolerance;Decreased balance;Decreased range of motion;Decreased safety awareness;Impaired UE functional use;Decreased strength;Impaired tone;Impaired flexibility   Rehab Potential Good   Clinical Impairments Affecting Rehab Potential Transportation limitations (pt does not drive; brother works FT)   PT Frequency 2x / week   PT Duration 8 weeks   PT Treatment/Interventions DME Instruction;Electrical Stimulation;Gait training;Stair training;Functional mobility training;Therapeutic activities;Therapeutic exercise;Balance training;Patient/family education;Orthotic Fit/Training;Neuromuscular re-education;Manual techniques;Vestibular   PT Next Visit Plan Please initiate HEP; consider focusing on stretching R quads, decreasing RLE extensor synergy, improving quality of transfers. If time, may also work on negotiating stairs while safely managing SBQC (per brother's request).   Recommended Other Services Per orthotist Maisie Fushomas, can cut pt's R AFO back to sulcus to enable pt to roll of toes during RLE terminal stance. Unsafe to modify solid back into hinged back until pt able to lift body weight using R PF.    Consulted and Agree with Plan of Care Patient;Family member/caregiver   Family Member Consulted brother, [place]        Problem List Patient Active Problem List   Diagnosis Date Noted  . OSA (obstructive sleep apnea) 10/11/2014  . Excessive daytime sleepiness 10/11/2014  . Hemiparesis (HCC) 10/11/2014  . Dysarthria 10/11/2014  . Pneumoperitoneum 05/01/2013  . Lower GI bleed 05/01/2013  . UTI (urinary tract infection) 05/01/2013  . Supratherapeutic INR 05/01/2013  . Chronic indwelling Foley catheter 05/01/2013  . Jejunostomy tube present (?originally G tube?) 05/01/2013  . Laceration of eyebrow s/p repair 04/26/2013 05/01/2013  . 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 Hemiparesis affecting right side as late effect of  stroke (HCC) 05/01/2013  . 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 DVT of right axillary vein, acute 05/01/2013  . CVA (cerebral vascular accident) (HCC) 05/01/2013  . Anemia 05/01/2013  . Dysphagia     Jorje Guild, PT, DPT [facility] [address] [place], [place], [address] Phone: [phone]   Fax:  [phone] 06/11/2015, 9:25 AM   Name: [name] [name] MRN: [id number] Date of Birth: March 23, 1956

## 2015-06-13 ENCOUNTER — Ambulatory Visit: Payer: BLUE CROSS/BLUE SHIELD | Admitting: Occupational Therapy

## 2015-06-15 ENCOUNTER — Encounter: Payer: Self-pay | Admitting: Rehabilitation

## 2015-06-15 ENCOUNTER — Ambulatory Visit: Payer: BLUE CROSS/BLUE SHIELD | Attending: Neurology | Admitting: Rehabilitation

## 2015-06-15 DIAGNOSIS — R269 Unspecified abnormalities of gait and mobility: Secondary | ICD-10-CM | POA: Insufficient documentation

## 2015-06-15 DIAGNOSIS — R2681 Unsteadiness on feet: Secondary | ICD-10-CM | POA: Insufficient documentation

## 2015-06-15 DIAGNOSIS — R279 Unspecified lack of coordination: Secondary | ICD-10-CM | POA: Insufficient documentation

## 2015-06-15 DIAGNOSIS — M6249 Contracture of muscle, multiple sites: Secondary | ICD-10-CM | POA: Insufficient documentation

## 2015-06-15 DIAGNOSIS — I69898 Other sequelae of other cerebrovascular disease: Secondary | ICD-10-CM | POA: Insufficient documentation

## 2015-06-15 DIAGNOSIS — I69359 Hemiplegia and hemiparesis following cerebral infarction affecting unspecified side: Secondary | ICD-10-CM | POA: Insufficient documentation

## 2015-06-15 DIAGNOSIS — I69319 Unspecified symptoms and signs involving cognitive functions following cerebral infarction: Secondary | ICD-10-CM | POA: Diagnosis present

## 2015-06-15 DIAGNOSIS — R531 Weakness: Secondary | ICD-10-CM | POA: Diagnosis present

## 2015-06-15 DIAGNOSIS — IMO0002 Reserved for concepts with insufficient information to code with codable children: Secondary | ICD-10-CM

## 2015-06-15 DIAGNOSIS — M62838 Other muscle spasm: Secondary | ICD-10-CM

## 2015-06-15 NOTE — Patient Instructions (Signed)
Bracing With Bridging (Hook-Lying)    With neutral spine, bend up both legs. Lift bottom. Repeat _10__ times. Do _2__ times a day.   Copyright  VHI. All rights reserved.   Functional Quadriceps: Sit to Stand    Sit on edge of chair, feet flat on floor. Stand upright, extending knees fully.  Try to only use your left hand on your lap.   Repeat _10___ times per set. Do __1__ sets per session. Do __2__ sessions per day.  http://orth.exer.us/734   Copyright  VHI. All rights reserved.   Alternating Step   Stand beside of a counter top with L hand on counter for support.  Lift your knees as high as you can while staying tall!! Repeat __10__ times on each leg. Do __2_ sessions per day.  http://gt2.exer.us/493   Copyright  VHI. All rights reserved.   ABDUCTION: Standing (Active)    Stand, feet flat, facing a counter top for support and balance. Lift left leg out to side and then step back in.  Stay tall the whole time!! Complete _1__ sets of _10__ repetitions. Perform __2_ sessions per day.  http://gtsc.exer.us/110   Copyright  VHI. All rights reserved.

## 2015-06-15 NOTE — Therapy (Signed)
[facility] [address] [place], [place], [address] Phone: [phone]   Fax:  [phone]  Physical Therapy Treatment  Patient Details  Name: [name] [name] MRN: [id number] Date of Birth: 1955-08-30 Referring Provider: Naomie Dean, MD  Encounter Date: 06/15/2015      PT End of Session - 06/15/15 1107    Visit Number 3   Number of Visits 17   Date for PT Re-Evaluation 08/04/15   Authorization Type BCBS; 20 visit limit for PT   Authorization - Visit Number 3   Authorization - Number of Visits 20   PT Start Time 0800   PT Stop Time 0845   PT Time Calculation (min) 45 min   Activity Tolerance Patient tolerated treatment well   Behavior During Therapy [facility] for tasks assessed/performed      Past Medical History  Diagnosis Date  . Dysphagia   . DVT (deep venous thrombosis) (HCC)   . Hypertension   . Depression   . 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 Enlarged prostate     Past Surgical History  Procedure Laterality Date  . Esophagoscopy w/ percutaneous gastrostomy tube placement  04/12/2013    Dr Murrell Converse, [facility]    There were no vitals filed for this visit.  Visit Diagnosis:  Abnormality of gait  Muscle spasticity  Unsteadiness  Weakness due to cerebrovascular accident      Subjective Assessment - 06/15/15 0803    Subjective "This is really early but I'm glad to be here."  No other changes since last visit and no falls.  continues to have many questions regarding gait outdoors and stair negotiation.    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 Pt's brother's goals: to improve safety awareness, to increase pt safety going up stairs with cane.   Currently in Pain? No/denies              NMR:  Addressed initiation of HEP with supine BLE bridging x 10 reps with cues for slower and more equal movements.  Attempted to perform single RLE bridging off EOM, however pt unable to fully dis-engage LLE, therefore will only perform in therapy to ensure performing properly.  Progressed to standing exercises as well as sit<>stand exercises see pt instruction for full details.   Provided education to pts brother in order to ensure performing properly.    Gait:  Ended session with performance of stairs with SBQC only x 3 reps of 4, 6" steps with max verbal and tactile cues for sequencing and technique.  Pt did very well ascending stairs, however had marked difficulty with descending stairs.  Continue to provide education to pt to not perform this at home/community without assist from family (would like to have brother come back at some point to educate on technique).                     PT Education - 06/15/15 0805    Education provided Yes   Education Details HEP, not walking outdoors on unlevel surfaces until therapy can evaluate for safety.    Person(s) Educated Patient  Methods Explanation;Handout   Comprehension Verbalized understanding          PT Short Term Goals - 06/11/15 6045    PT SHORT TERM GOAL #1   Title Pt will perform initial HEP with mod I using paper handout to maximize functional gains made in PT. Target date:07/03/15   PT SHORT TERM GOAL #2   Title Complete Berg and improve score by 5 points from baseline to indicate improved functional standing balance. Target date:07/03/15   Baseline 10/31: baseline Berg score = 42/56   PT SHORT TERM GOAL #3   Title Pt will decrease TUG from 25.10 sec to < 20 sec to indicate increased efficiency of mobility. Target date:07/03/15   PT SHORT TERM GOAL #4    Title Pt will increase gait velocity from 1.23 ft/sec to 1.53 ft/sec to indicate increased efficiency of ambulation. Target date:07/03/15   PT SHORT TERM GOAL #5   Title Pt will negotiate 4 stairs with 2 rails (either side to simulate home entrance) while safely managing SPC to indicate increased safety using primary entrance of home.  Target date:07/03/15   PT SHORT TERM GOAL #6   Title Pt will ambulate 200' over level, indoor surfaces with mod I with proper use of LRAD to indicate increased safety with household mobility, safe use of AD. Target date:07/03/15           PT Long Term Goals - 06/11/15 0925    PT LONG TERM GOAL #1   Title Pt will verbalize understanding of CVA warning signs, pertinent risk factors to prevent future CVA. Target date: 07/31/15   PT LONG TERM GOAL #2   Title Pt will verbalize understanding of fall prevention strategies to decrease fall risk in home environment. Target date: 07/31/15   PT LONG TERM GOAL #3   Title Pt will negotiate standard ramp and curb step with mod I using LRAD to increase pt safety/independence traversing community obstacles. Target date: 07/31/15   PT LONG TERM GOAL #4   Title Pt will ambulate 500' over unlevel, paved surfaces with mod I using LRAD to indicate increased safety with limited community mobility. Target date: 07/31/15   PT LONG TERM GOAL #5   Title Pt will increase gait velocity from 1.23 ft/sec to > / = 1.83 ft/sec to indicate decreased risk of recurrent falls. Target date: 07/31/15   PT LONG TERM GOAL #6   Title Pt will improve Berg score by 8 points from baseline to indicate significant improvement in functional standing balance. Target date: 07/31/15   Baseline 10/31: baseline Berg score = 42/56   PT LONG TERM GOAL #7   Title Pt will decrease TUG time from 25.10 sec to < 15 seconds to indicate decreased fall risk. Target date: 07/31/15               Plan - 06/15/15 1108    Clinical Impression Statement Skilled  session focused on initiation of HEP with supine and standing exericses, see pt instruction for details.  Provided education on cues to provide to pt with brother at end of session.  Also address stair negotiation with use of SBQC.  Pt needs max verbal cues and min A to complete, therefore recommend he not perform until therapy can work with him and/or brother more to ensure safety.    Pt will benefit from skilled therapeutic intervention in order to improve on the following deficits Abnormal gait;Decreased cognition;Decreased knowledge of use of DME;Decreased coordination;Decreased mobility;Decreased endurance;Decreased activity tolerance;Decreased balance;Decreased  range of motion;Decreased safety awareness;Impaired UE functional use;Decreased strength;Impaired tone;Impaired flexibility   Rehab Potential Good   Clinical Impairments Affecting Rehab Potential Transportation limitations (pt does not drive; brother works FT)   PT Frequency 2x / week   PT Duration 8 weeks   PT Treatment/Interventions DME Instruction;Electrical Stimulation;Gait training;Stair training;Functional mobility training;Therapeutic activities;Therapeutic exercise;Balance training;Patient/family education;Orthotic Fit/Training;Neuromuscular re-education;Manual techniques;Vestibular   PT Next Visit Plan consider focusing on stretching R quads, decreasing RLE extensor synergy, improving quality of transfers. negotiating stairs while safely managing SBQC (per brother's request).   PT Home Exercise Plan see pt instruction   Consulted and Agree with Plan of Care Patient;Family member/caregiver   Family Member Consulted brother, Aywood        Problem List Patient Active Problem List   Diagnosis Date Noted  . OSA (obstructive sleep apnea) 10/11/2014  . Excessive daytime sleepiness 10/11/2014  . Hemiparesis (HCC) 10/11/2014  . Dysarthria 10/11/2014  . Pneumoperitoneum 05/01/2013  . Lower GI bleed 05/01/2013  . UTI (urinary  tract infection) 05/01/2013  . Supratherapeutic INR 05/01/2013  . Chronic indwelling Foley catheter 05/01/2013  . Jejunostomy tube present (?originally G tube?) 05/01/2013  . Laceration of eyebrow s/p repair 04/26/2013 05/01/2013  . 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 Hemiparesis affecting right side as late effect of stroke (HCC) 05/01/2013  . 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 DVT of right axillary vein, acute 05/01/2013  . CVA (cerebral vascular accident) (HCC) 05/01/2013  . Anemia 05/01/2013  . Dysphagia     Harriet ButteEmily Takyah Ciaramitaro, PT, MPT [facility] [address] [place], [place], [address] Phone: [phone]   Fax:  [phone] 06/15/2015, 11:11 AM ' Name: [name] [name] MRN: [id number] Date of Birth: 1956/03/09

## 2015-06-18 ENCOUNTER — Encounter: Payer: Self-pay | Admitting: Occupational Therapy

## 2015-06-18 ENCOUNTER — Ambulatory Visit: Payer: BLUE CROSS/BLUE SHIELD | Admitting: Occupational Therapy

## 2015-06-18 ENCOUNTER — Ambulatory Visit: Payer: BLUE CROSS/BLUE SHIELD | Admitting: Rehabilitation

## 2015-06-18 DIAGNOSIS — M62838 Other muscle spasm: Secondary | ICD-10-CM

## 2015-06-18 DIAGNOSIS — R269 Unspecified abnormalities of gait and mobility: Secondary | ICD-10-CM | POA: Diagnosis not present

## 2015-06-18 DIAGNOSIS — I69359 Hemiplegia and hemiparesis following cerebral infarction affecting unspecified side: Secondary | ICD-10-CM

## 2015-06-18 DIAGNOSIS — IMO0002 Reserved for concepts with insufficient information to code with codable children: Secondary | ICD-10-CM

## 2015-06-18 NOTE — Therapy (Signed)
[facility] [address] [place], [place], [address] Phone: [phone]   Fax:  [phone]  Occupational Therapy Treatment  Patient Details  Name: [name] NissenRobert [name] MRN: [id number] Date of Birth: 11/05/1955 Referring Provider: Dr. Lucia GaskinsAhern  Encounter Date: 06/18/2015      OT End of Session - 06/18/15 0904    Visit Number 2   Number of Visits 17   Date for OT Re-Evaluation 08/03/15   Authorization Type BCBS   Authorization Time Period 20 visit limit   Authorization - Visit Number 2   Authorization - Number of Visits 20   OT Start Time 0807   OT Stop Time 0848   OT Time Calculation (min) 41 min   Activity Tolerance Patient tolerated treatment well      Past Medical History  Diagnosis Date  . Dysphagia   . DVT (deep venous thrombosis) (HCC)   . Hypertension   . Depression   . 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 Enlarged prostate     Past Surgical History  Procedure Laterality Date  . Esophagoscopy w/ percutaneous gastrostomy tube placement  04/12/2013    [facility] Murrell [facility]verseHildreth[facility]    There were no vitals filed for this visit.  Visit Diagnosis:  Muscle spasticity  Weakness due to cerebrovascular accident  Hemiparesis affecting dominant side as late effect of cerebrovascular accident [facility](HCC)      Subjective Assessment - 06/18/15 0812    Pertinent History see epic    Patient Stated Goals to use arm again, write name   Currently in Pain? No/denies                      OT Treatments/Exercises (OP) - 06/18/15 0001    Exercises   Exercises --  see pt instructions for RUE HEP                OT Education - 06/18/15 0847    Education provided Yes   Education Details HEP for RUE   Person(s) Educated Patient   Methods  Explanation;Demonstration;Handout   Comprehension Verbalized understanding;Returned demonstration          OT Short Term Goals - 06/06/15 1142    OT SHORT TERM GOAL #1   Title I with HEP.   Time 4   Period Weeks   Status New   OT SHORT TERM GOAL #2   Title Pt will verbalize understanding of RUE positioning to minimize risk for injury, pain  and contracture.   Time 4   Period Weeks   Status New   OT SHORT TERM GOAL #3   Title Pt will demonstrate A/ROM shoulder flexion of 60* in prep for functional reach.   Time 4   Period Weeks   Status New   OT SHORT TERM GOAL #4   Title Pt will demonstrate ability to write his name with 70% legibility   Time 4   Period Weeks   Status New           OT Long Term Goals - 06/06/15 1143    OT LONG TERM GOAL #1   Title Pt will demonstrate ability to perform light home management / cooking modified independently demonstrating good safety awareness.   Time 8   Period Weeks   Status New   OT LONG TERM GOAL #2  Title Pt will report ability to use RUE as an active assist at least 50% of the time for light home management/ ADLs.   Time 8   Period Weeks   Status New   OT LONG TERM GOAL #3   Title Pt will demonstrate improved RUE functional use as evidenced by performing 10 blocks on box/ blocks with RUE.   Time 8   Period Weeks   Status New   OT LONG TERM GOAL #4   Title Pt will demonstrate ability to write his name with 90% legibility   Time 8   Period Weeks   Status New               Plan - 06/18/15 0906    Clinical Impression Statement Pt progressing with HEP but will need review due to cognitive deficits.    Plan review HEP, progress towards STG's   Consulted and Agree with Plan of Care Patient        Problem List Patient Active Problem List   Diagnosis Date Noted  . OSA (obstructive sleep apnea) 10/11/2014  . Excessive daytime sleepiness 10/11/2014  . Hemiparesis (HCC) 10/11/2014  . Dysarthria 10/11/2014  .  Pneumoperitoneum 05/01/2013  . Lower GI bleed 05/01/2013  . UTI (urinary tract infection) 05/01/2013  . Supratherapeutic INR 05/01/2013  . Chronic indwelling Foley catheter 05/01/2013  . Jejunostomy tube present (?originally G tube?) 05/01/2013  . Laceration of eyebrow s/p repair 04/26/2013 05/01/2013  . 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 Hemiparesis affecting right side as late effect of stroke (HCC) 05/01/2013  . 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 DVT of right axillary vein, acute 05/01/2013  . CVA (cerebral vascular accident) (HCC) 05/01/2013  . Anemia 05/01/2013  . Dysphagia     Kelli Churn, OTR/L 06/18/2015, 9:07 AM  [facility] [address] [place], [place], [address] Phone: [phone]   Fax:  [phone]  Name: [name] [name] MRN: [id number] Date of Birth: 03/03/1956

## 2015-06-18 NOTE — Patient Instructions (Signed)
Flexion (Assistive)    Hold Rt wrist with Lt hand (Rt thumb up) and raise arms above head, keeping elbows as straight as possible. ONLY do lying down Repeat _10___ times. Do __2-3_ sessions per day.      Press-Up With Wand   Press wand up until elbows are straight to eye level (chest press) and hold 5 seconds.  Repeat 10 times. Do 2-3 sessions per day.   Flexion (Passive)    Sitting upright, slide Rt arm forwards and backwards on table with help from Lt arm, until a stretch is felt. Hold __5__ seconds. Repeat _10___ times. Do __2-3__ sessions per day.   Supination (Passive)    Keep elbow bent at right angle and held firmly at side. Use other hand to turn forearm until palm faces upward. Hold _10 ___ seconds. Repeat __5__ times. Do __2-3__ sessions per day.   SELF ASSISTED: Finger Extension    Use one hand to open fingers on other hand. Hold 10 seconds,  _5_ reps per set, 2-3 sessions per day. Do NOT hyperextend knuckles

## 2015-06-19 ENCOUNTER — Encounter: Payer: Self-pay | Admitting: Neurology

## 2015-06-19 ENCOUNTER — Ambulatory Visit (INDEPENDENT_AMBULATORY_CARE_PROVIDER_SITE_OTHER): Payer: BLUE CROSS/BLUE SHIELD | Admitting: Neurology

## 2015-06-19 VITALS — BP 149/90 | HR 93 | Ht 71.0 in | Wt 224.4 lb

## 2015-06-19 DIAGNOSIS — G8111 Spastic hemiplegia affecting right dominant side: Secondary | ICD-10-CM | POA: Diagnosis not present

## 2015-06-19 NOTE — Progress Notes (Signed)
[facility]    Provider:  Dr Lucia Gaskins Referring Provider: Renaye Rakers, MD Primary Care Physician:  Geraldo Pitter, MD  CC:  Stroke  HPI:  [name] [name] is a 59 y.o. male here as a referral from Dr. Parke Simmers for stroke. PMHx memory loss, stroke, hypertension 59 year old male with history of hypertension, history of DVT on Coumadin who was admitted to [facility] recently with an acute stroke involving a large area on the left side with residual right hemiplegia and dysphasia was discharged to skilled nursing facility. His brother is here and provides most information, patient is a poor historian. Brother dsays patient had a clot that traveled through his lungs to the posterior circulation and casued the stroke. He had a PEG tube placed in the hospital due to dysphagia. He has no records with him. He was going to [facility] for yearly updates. He still has weakness and some dysarthria. He can barely walk without the cane. He has drooling which is worse. He is in physical therapy. Has been a year since the last speech therapy. He has some vision problems reading close up. 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 He is having frequent awakenings at night for unknown reasons, He lives alone, unclear if snores and is excessively tired during the day. They found him in his truck, they think he was there in the truck for 2-3 days after having the stroke, patient couldn't move and couldn't get out of the truck. He has gained a lot of weight recently.  Update 03/14/2015 : Patient returns for follow-up after last visit with Dr. Lucia Gaskins in March 2016. A helical CT of the basilar. Patient continues to have significant right arm weakness that is now able to walk with a cane. He has mild dysarthria and speech expressive difficulties. He is living alone but the brother lives nearby and lites some help. He has not had  any recurrent stroke or TIA symptoms. His blood pressure is under good control is 139/76 in the office. I have reviewed his prior stroke records from August 2014 at [facility] care everywhere. Reviewed notes, labs and imaging from Received records from [facility] 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 He has residual right-sided hemiparesis. He presented initially to an outside facility in August 2014 after being found down in his truck. CT of the head showed subacute left pontine infarct. NIH stroke scale of 30. 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 Patient was taken for a basilar thrombectomy. This did not reestablish flow. 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 He was started on anticoagulation due to the intra-arterial clot as well as a right upper extremity DVT. He has been continued on warfarin since then. INR goal 2-3. Last neurology follow-up visit in January 2016 had suggested stopping warfarin and checking hypercoagulable panel but this was not done  Update 06/19/2015 :  He returns for follow-up after last visit with me 3 months ago. He has seen Dr. Lucia Gaskins and received Botox in his right arm and hand but states she did not notice significant improvement in his pain however improved range of motion. He is currently  doing outpatient therapy. He had follow-up carotid ultrasound done on 03/29/15 which showed no significant extracranial stenosis. He remains on Plavix which is tolerating well without significant bleeding or bruising. He is also tolerating Lipitor well without significant muscle aches or pains. He states his blood pressure is well controlled though it is slightly elevated at 149/90 now office. He had lipid profile checked  one half months ago and was fine. Heating a particular diet and not losing weight.   .   Review of Systems: Patient complains of symptoms per HPI as well as the following symptoms:   weakness, gait difficulty, leg weakness. Pertinent negatives per HPI. All others negative.   Social History   Social History  . Marital Status: Married    Spouse Name: N/A  . Number of Children: 0  . Years of Education: 12   Occupational History  . umemployed    Social History Main Topics  . Smoking status: Never Smoker   . Smokeless tobacco: Not on file  . Alcohol Use: No  . Drug Use: No  . Sexual Activity: Yes   Other Topics Concern  . Not on file   Social History Narrative   Lives at home by self.    Single.   Caffeine use: daily    Family History  Problem Relation Age of Onset  . High blood pressure    . Diabetes    . Heart Problems    . Heart Problems Mother   . Stroke Father     Past Medical History  Diagnosis Date  . Dysphagia   . DVT (deep venous thrombosis) (HCC)   . Hypertension   . Depression   . 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 Enlarged prostate     Past Surgical History  Procedure Laterality Date  . Esophagoscopy w/ percutaneous gastrostomy tube placement  04/12/2013    Dr Murrell Converse, [facility]    Current Outpatient Prescriptions  Medication Sig Dispense Refill  . acetaminophen (TYLENOL) 160 MG/5ML liquid Take 640 mg by mouth every 4 (four) hours as needed for fever.     [name] atorvastatin (LIPITOR) 40 MG tablet 40 mg by PEG Tube route daily.    . botulinum toxin Type A (BOTOX) 100 UNITS SOLR injection To be administered by provider in office to muscles every three months. 5 vial 0  . clopidogrel (PLAVIX) 75 MG tablet Take 1 tablet (75 mg total) by mouth daily. 30 tablet 11  . doxazosin (CARDURA) 1 MG tablet Take 1 tablet  by mouth daily.  0  . QUEtiapine (SEROQUEL) 25 MG tablet Take 25 mg by mouth at bedtime.     . sertraline (ZOLOFT) 25 MG tablet Take 25 mg by mouth daily.      No current facility-administered medications for this visit.    Allergies as of 06/19/2015  . (No Known Allergies)    Vitals: BP 149/90 mmHg  Pulse 93  Ht  (1.803 m)  Wt 224 lb 6.4 oz (101.787 kg)  BMI 31.31 kg/m2 Last Weight:  Wt Readings from Last 1 Encounters:  06/19/15 224 lb 6.4 oz (101.787 kg)   Last Height:   Ht Readings from Last 1 Encounters:  06/19/15  (1.803 m)    Physical exam: Exam: Gen: NAD, well groomed  middle-aged male  CV: RRR, no MRG. No Carotid Bruits. No peripheral edema, warm, nontender Eyes: Conjunctivae clear without exudates or hemorrhage Lungs clear to auscultation. Neuro: Awake alert oriented 3 Speech:    Speech is dysarthric;  Cognition:    The patient is oriented to person    recent and remote memory impaired;     Language not fluent;     Impaired attention, concentration,     fund of knowledge appears impaired Cranial Nerves:    The pupils are equal, round, and reactive to light. Cannot visualize fundi due to patient movements.  Visual fields are full to finger confrontation. Extraocular movements are intact. Trigeminal sensation is intact and the muscles of mastication are normal. The face is slightly asymmetric. The palate elevates in the midline. Hearing appears intact. Voice is normal. Shoulder shrug is normal. The tongue has normal motion without fasciculations.   Coordination:    Cannot perform on the right due to spasticity and weakness  Gait:   Right leg circumduction with flexion at the knee  Motor Observation:    no involuntary movements noted. Tone:    Normal muscle tone.    Posture:    Posture is normal. normal erect    Strength:    Right spastic hemiparesis, right deltoid and hip flexion 2+/5 in upper extremities and 4/5 in the  lower extremities except ankle dorsiflexor weakness with footdrop     Sensation: intact to LT     Reflex Exam:  DTR's:    Brisk on the right Toes:    The toes are up on the right, down on the left    Clonus:    Clonus is absent.      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 I also advised the patient to eat a healthy diet with plenty of whole grains, cereals, fruits and vegetables, exercise regularly and maintain ideal body weight .I encouraged him to keep appointment with Dr. Lucia GaskinsAhern for Botox injections as well as with physical therapy to improve his arm range of motion, pain and walking. Followup in the future with me in 1 year or call earlier if needed. Delia HeadyPramod Demarquez Ciolek, MD  [facility] [address] [place], [place] [address]  Phone [phone] Fax [phone]

## 2015-06-19 NOTE — Patient Instructions (Signed)
I had a long d/w patient and friend about his remote stroke, risk for recurrent stroke/TIAs, personally independently reviewed imaging studies and stroke evaluation results and answered questions.Continue Plavix  for secondary stroke prevention and maintain strict control of hypertension with blood pressure goal below 130/90, diabetes with hemoglobin A1c goal below 6.5% and lipids with LDL cholesterol goal below 70 mg/dL. I also advised the patient to eat a healthy diet with plenty of whole grains, cereals, fruits and vegetables, exercise regularly and maintain ideal body weight .I encouraged him to keep appointment with Dr. Daisy Blossom for Botox injections as well as with physical therapy to improve his arm range of motion, pain and walking. Followup in the future with me in 1 year or call earlier if needed. DASH Eating Plan DASH stands for "Dietary Approaches to Stop Hypertension." The DASH eating plan is a healthy eating plan that has been shown to reduce high blood pressure (hypertension). Additional health benefits may include reducing the risk of type 2 diabetes mellitus, heart disease, and stroke. The DASH eating plan may also help with weight loss. WHAT DO I NEED TO KNOW ABOUT THE DASH EATING PLAN? For the DASH eating plan, you will follow these general guidelines:  Choose foods with a percent daily value for sodium of less than 5% (as listed on the food label).  Use salt-free seasonings or herbs instead of table salt or sea salt.  Check with your health care provider or pharmacist before using salt substitutes.  Eat lower-sodium products, often labeled as "lower sodium" or "no salt added."  Eat fresh foods.  Eat more vegetables, fruits, and low-fat dairy products.  Choose whole grains. Look for the word "whole" as the first word in the ingredient list.  Choose fish and skinless chicken or [place] more often than red meat. Limit fish, poultry, and meat to 6 oz (170 g) each day.  Limit sweets,  desserts, sugars, and sugary drinks.  Choose heart-healthy fats.  Limit cheese to 1 oz (28 g) per day.  Eat more home-cooked food and less restaurant, buffet, and fast food.  Limit fried foods.  Cook foods using methods other than frying.  Limit canned vegetables. If you do use them, rinse them well to decrease the sodium.  When eating at a restaurant, ask that your food be prepared with less salt, or no salt if possible. WHAT FOODS CAN I EAT? Seek help from a dietitian for individual calorie needs. Grains Whole grain or whole wheat bread. Brown rice. Whole grain or whole wheat pasta. Quinoa, bulgur, and whole grain cereals. Low-sodium cereals. Corn or whole wheat flour tortillas. Whole grain cornbread. Whole grain crackers. Low-sodium crackers. Vegetables Fresh or frozen vegetables (raw, steamed, roasted, or grilled). Low-sodium or reduced-sodium tomato and vegetable juices. Low-sodium or reduced-sodium tomato sauce and paste. Low-sodium or reduced-sodium canned vegetables.  Fruits All fresh, canned (in natural juice), or frozen fruits. Meat and Other Protein Products Ground beef (85% or leaner), grass-fed beef, or beef trimmed of fat. Skinless chicken or [place]. Ground chicken or [place]. Pork trimmed of fat. All fish and seafood. Eggs. Dried beans, peas, or lentils. Unsalted nuts and seeds. Unsalted canned beans. Dairy Low-fat dairy products, such as skim or 1% milk, 2% or reduced-fat cheeses, low-fat ricotta or cottage cheese, or plain low-fat yogurt. Low-sodium or reduced-sodium cheeses. Fats and Oils Tub margarines without trans fats. Light or reduced-fat mayonnaise and salad dressings (reduced sodium). Avocado. Safflower, olive, or canola oils. Natural peanut or almond butter. Other Unsalted  popcorn and pretzels. The items listed above may not be a complete list of recommended foods or beverages. Contact your dietitian for more options. WHAT FOODS ARE NOT  RECOMMENDED? Grains White bread. White pasta. White rice. Refined cornbread. Bagels and croissants. Crackers that contain trans fat. Vegetables Creamed or fried vegetables. Vegetables in a cheese sauce. Regular canned vegetables. Regular canned tomato sauce and paste. Regular tomato and vegetable juices. Fruits Dried fruits. Canned fruit in light or heavy syrup. Fruit juice. Meat and Other Protein Products Fatty cuts of meat. Ribs, chicken wings, bacon, sausage, bologna, salami, chitterlings, fatback, hot dogs, bratwurst, and packaged luncheon meats. Salted nuts and seeds. Canned beans with salt. Dairy Whole or 2% milk, cream, half-and-half, and cream cheese. Whole-fat or sweetened yogurt. Full-fat cheeses or blue cheese. Nondairy creamers and whipped toppings. Processed cheese, cheese spreads, or cheese curds. Condiments Onion and garlic salt, seasoned salt, table salt, and sea salt. Canned and packaged gravies. Worcestershire sauce. Tartar sauce. Barbecue sauce. Teriyaki sauce. Soy sauce, including reduced sodium. Steak sauce. Fish sauce. Oyster sauce. Cocktail sauce. Horseradish. Ketchup and mustard. Meat flavorings and tenderizers. Bouillon cubes. Hot sauce. Tabasco sauce. Marinades. Taco seasonings. Relishes. Fats and Oils Butter, stick margarine, lard, shortening, ghee, and bacon fat. Coconut, palm kernel, or palm oils. Regular salad dressings. Other Pickles and olives. Salted popcorn and pretzels. The items listed above may not be a complete list of foods and beverages to avoid. Contact your dietitian for more information. WHERE CAN I FIND MORE INFORMATION? National Heart, Lung, and Blood Institute: CablePromo.itwww.nhlbi.nih.gov/health/health-topics/topics/dash/   This information is not intended to replace advice given to you by your health care provider. Make sure you discuss any questions you have with your health care provider.   Document Released: 07/17/2011 Document Revised: 08/18/2014  Document Reviewed: 06/01/2013 Elsevier Interactive Patient Education [employer]2016 Elsevier Inc.

## 2015-06-21 ENCOUNTER — Ambulatory Visit: Payer: BLUE CROSS/BLUE SHIELD | Admitting: Rehabilitation

## 2015-06-22 ENCOUNTER — Ambulatory Visit: Payer: BLUE CROSS/BLUE SHIELD | Admitting: Physical Therapy

## 2015-06-22 ENCOUNTER — Ambulatory Visit: Payer: BLUE CROSS/BLUE SHIELD | Admitting: Occupational Therapy

## 2015-06-22 DIAGNOSIS — R269 Unspecified abnormalities of gait and mobility: Secondary | ICD-10-CM

## 2015-06-22 DIAGNOSIS — I69359 Hemiplegia and hemiparesis following cerebral infarction affecting unspecified side: Secondary | ICD-10-CM

## 2015-06-22 DIAGNOSIS — M62838 Other muscle spasm: Secondary | ICD-10-CM

## 2015-06-22 DIAGNOSIS — R2681 Unsteadiness on feet: Secondary | ICD-10-CM

## 2015-06-22 DIAGNOSIS — IMO0002 Reserved for concepts with insufficient information to code with codable children: Secondary | ICD-10-CM

## 2015-06-22 NOTE — Therapy (Signed)
[facility] [address] [place], [place], [address] Phone: [phone]   Fax:  [phone]  Occupational Therapy Treatment  Patient Details  Name: [name] NissenRobert [name] MRN: [id number] Date of Birth: [birth year] Referring Provider: Dr. Lucia GaskinsAhern  Encounter Date: 06/22/2015      OT End of Session - 06/22/15 1415    Visit Number 3   Number of Visits 17   Date for OT Re-Evaluation 08/03/15   Authorization Type BCBS   Authorization Time Period 20 visit limit   Authorization - Visit Number 3   Authorization - Number of Visits 20   OT Start Time 1318   OT Stop Time 1400   OT Time Calculation (min) 42 min   Activity Tolerance Patient tolerated treatment well   Behavior During Therapy [facility] for tasks assessed/performed      Past Medical History  Diagnosis Date  . Dysphagia   . DVT (deep venous thrombosis) (HCC)   . Hypertension   . Depression   . 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 Enlarged prostate     Past Surgical History  Procedure Laterality Date  . Esophagoscopy w/ percutaneous gastrostomy tube placement  04/12/2013    [facility]    There were no vitals filed for this visit.  Visit Diagnosis:  Muscle spasticity  Weakness due to cerebrovascular accident  Hemiparesis affecting dominant side as late effect of cerebrovascular accident [facility](HCC)      Subjective Assessment - 06/22/15 1324    Subjective  Denies pain   Pertinent History see epic    Patient Stated Goals to use arm again, write name   Currently in Pain? No/denies       Treatment: supine self ROM shoulder flexion, and chest press from HEP, 2 sets of 10 reps, min-mod facilitation v.c . Supine with right arm in abduction, rolling towards RUE and away from RUE, for weightbearing/ trunk rotation, mod  facilitation. Sidelying-sit,  Min  A Seated neuro re-ed for shoulder flexion/ circumduction with UE ranger mod-max facilitation/ v.c.(Pt required cueing to focus on task as pt is self distracting when talking) Therapist discussed occupational therapy expectations with pt and that progress with likely be in small incremental steps (as pt appears to demonstrate unrealistic expectations of improvement.)Therapist reinforce that pt should not use weights with RUE as it will increase spasticity.                         OT Short Term Goals - 06/06/15 1142    OT SHORT TERM GOAL #1   Title I with HEP.   Time 4   Period Weeks   Status New   OT SHORT TERM GOAL #2   Title Pt will verbalize understanding of RUE positioning to minimize risk for injury, pain  and contracture.   Time 4   Period Weeks   Status New   OT SHORT TERM GOAL #3   Title Pt will demonstrate A/ROM shoulder flexion of 60* in prep for functional reach.   Time 4   Period Weeks   Status New   OT SHORT TERM GOAL #4   Title Pt will demonstrate ability to write his name with 70% legibility   Time 4   Period Weeks   Status New  OT Long Term Goals - 06/06/15 1143    OT LONG TERM GOAL #1   Title Pt will demonstrate ability to perform light home management / cooking modified independently demonstrating good safety awareness.   Time 8   Period Weeks   Status New   OT LONG TERM GOAL #2   Title Pt will report ability to use RUE as an active assist at least 50% of the time for light home management/ ADLs.   Time 8   Period Weeks   Status New   OT LONG TERM GOAL #3   Title Pt will demonstrate improved RUE functional use as evidenced by performing 10 blocks on box/ blocks with RUE.   Time 8   Period Weeks   Status New   OT LONG TERM GOAL #4   Title Pt will demonstrate ability to write his name with 90% legibility   Time 8   Period Weeks   Status New               Plan - 06/22/15 1414     Clinical Impression Statement Pt is progressing towards goals yet limited by cognitive deficits and spasticity.   Pt will benefit from skilled therapeutic intervention in order to improve on the following deficits (Retired) Abnormal gait;Decreased coordination;Decreased range of motion;Difficulty walking;Impaired flexibility;Decreased endurance;Decreased safety awareness;Increased edema;Impaired sensation;Obesity;Decreased balance;Decreased knowledge of use of DME;Impaired UE functional use;Pain;Impaired perceived functional ability;Decreased strength;Decreased mobility;Decreased cognition;Impaired tone;Decreased activity tolerance;Decreased knowledge of precautions   Rehab Potential Good   Clinical Impairments Affecting Rehab Potential spasticity   OT Frequency 2x / week   OT Duration 8 weeks   OT Treatment/Interventions Self-care/ADL training;Therapeutic exercise;Patient/family education;Balance training;Splinting;Manual Therapy;Neuromuscular education;Ultrasound;Iontophoresis;Therapeutic exercises;Therapeutic activities;DME and/or AE instruction;Parrafin;Cryotherapy;Electrical Stimulation;Fluidtherapy;Gait Training;Cognitive remediation/compensation;Visual/perceptual remediation/compensation;Passive range of motion;Contrast Bath;Moist Heat   Plan reinforce/ progress HEP, neuro re-ed   Consulted and Agree with Plan of Care Patient        Problem List Patient Active Problem List   Diagnosis Date Noted  . OSA (obstructive sleep apnea) 10/11/2014  . Excessive daytime sleepiness 10/11/2014  . Hemiparesis (HCC) 10/11/2014  . Dysarthria 10/11/2014  . Pneumoperitoneum 05/01/2013  . Lower GI bleed 05/01/2013  . UTI (urinary tract infection) 05/01/2013  . Supratherapeutic INR 05/01/2013  . Chronic indwelling Foley catheter 05/01/2013  . Jejunostomy tube present (?originally G tube?) 05/01/2013  . Laceration of eyebrow s/p repair 04/26/2013 05/01/2013  . 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 Hemiparesis affecting right side as late effect of stroke (HCC) 05/01/2013  . 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 DVT of right axillary vein, acute 05/01/2013  . CVA (cerebral vascular accident) (HCC) 05/01/2013  . Anemia 05/01/2013  . Dysphagia     Alfard Cochrane 06/22/2015, 2:16 PM Keene Breath, OTR/L [phone] Phone: [phone] 2:16 PM 06/22/2015 [facility] [address] [place], [place], [address] Phone: [phone]   Fax:  [phone]  Name: [name] [name] MRN: [id number] Date of Birth: 1956/07/05

## 2015-06-23 NOTE — Therapy (Signed)
[facility] [address] [place], [place], [address] Phone: [phone]   Fax:  [phone]  Physical Therapy Treatment  Patient Details  Name: [name] [name] MRN: [id number] Date of Birth: 02-Jun-1956 Referring Provider: Naomie Dean, MD  Encounter Date: 06/22/2015      PT End of Session - 06/23/15 1309    Visit Number 4   Number of Visits 17   Date for PT Re-Evaluation 08/04/15   Authorization Type BCBS; 20 visit limit for PT   Authorization - Visit Number 4   Authorization - Number of Visits 20   PT Start Time 1406   PT Stop Time 1448   PT Time Calculation (min) 42 min   Activity Tolerance Patient tolerated treatment well;Treatment limited secondary to medical complications (Comment)  Limited by internal distraction   Behavior During Therapy Restless      Past Medical History  Diagnosis Date  . Dysphagia   . DVT (deep venous thrombosis) (HCC)   . Hypertension   . Depression   . 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 Enlarged prostate     Past Surgical History  Procedure Laterality Date  . Esophagoscopy w/ percutaneous gastrostomy tube placement  04/12/2013    Dr Murrell Converse, [facility]    There were no vitals filed for this visit.  Visit Diagnosis:  Abnormality of gait  Unsteadiness  Muscle spasticity  Hemiparesis affecting dominant side as late effect of cerebrovascular accident [facility])      Subjective Assessment - 06/23/15 1257    Subjective Pt denies falls, significant changes. Required frequent redirection due to perseveration on pt belief that "brain really can't regenerate".   Patient is accompained by: Family member  brother present post-session   Pertinent History bilateral pontine CVA (03/31/13); basilar thrombectomy (04/05/13); HTN   Patient Stated  Goals "I wish I could walk without the brace; take care of some of this stiffness; and get an exercise routine," per pt. Pt's brother's goals: to improve safety awareness, to increase pt safety going up stairs with cane.   Currently in Pain? No/denies                         [facility] Adult PT Treatment/Exercise - 06/23/15 0001    Transfers   Transfers Sit to Stand;Stand to Sit   Sit to Stand 5: Supervision   Stand to Sit 5: Supervision   Ambulation/Gait   Ambulation/Gait Yes   Ambulation/Gait Assistance 5: Supervision   Ambulation/Gait Assistance Details Reiterated verbal, demonstration, and HOH cueing for proper use of SBQC with inconsistent within-session carryover from pt.    Ambulation Distance (Feet) 175 Feet  x2   Assistive device Small based quad cane;Other (Comment);None  custom solid R AFO   Gait Pattern Step-through pattern;Decreased arm swing - right;Decreased step length - right;Decreased hip/knee flexion - right;Decreased stance time - right;Decreased weight shift to right;Abducted- right;Trunk rotated posteriorly on right;Right foot flat;Decreased dorsiflexion - right;Trunk flexed   Ambulation Surface Level;Indoor   Stairs Yes   Stairs Assistance 5: Supervision   Stairs Assistance Details (indicate cue type and reason) using SBQC, pt ascended stairs with supervision, cueing for proper placement of SBQC on stairs, proper sequencing of LE's during ascent/descent.   Stair Management Technique Step to pattern;One rail Right;Forwards   Number of Stairs 4  Height of Stairs 6   Gait Comments Orthotist present to assess/address current custom solid R AFO and make recommendations for appropriate modifications (per pt perception of AFO being restrictive). Orthotist recommended that current AFO 1) be modified to articulating; 2) add DF assist. Orthotist unsure if cutting off base to sulcus will greatly benefit pt.   Standardized Balance Assessment   Standardized Balance  Assessment --   Berg Balance Test   Sit to Stand --   Standing Unsupported --   Sitting with Back Unsupported but Feet Supported on Floor or Stool --   Stand to Sit --   Transfers --   Standing Unsupported with Eyes Closed --   Standing Ubsupported with Feet Together --   From Standing, Reach Forward with Outstretched Arm --   From Standing Position, Pick up Object from Floor --   From Standing Position, Turn to Look Behind Over each Shoulder --   Turn 360 Degrees --   Standing Unsupported, Alternately Place Feet on Step/Stool --   Standing Unsupported, One Foot in Front --   Standing on One Leg --   Total Score --   Neuro Re-ed    Neuro Re-ed Details  To manage spasticity in R quadriceps, instructed pt in supine, hook lying R quad stretch 2 x60-sec holds. Pt very concerned that he may not be able to attain position at home with PT assistance. Therefore, practiced setting up stretch x3 trials with use of mirror to enable pt to see positioning of RLE. Not added to HEP due to pt perception he will be unable to safely perform.                PT Education - 06/23/15 1258    Education provided Yes   Education Details Recommended modifications to current custom AFO. Provided brother with orthotist's card and contact info and recommended brother contact orthotist to set up appt.  Recommending brother accompany pt to therapy sessions to promote carryover.   Person(s) Educated Patient;Other (comment)  brother, [name]   Methods Explanation   Comprehension Verbalized understanding          PT Short Term Goals - 06/11/15 0924    PT SHORT TERM GOAL #1   Title Pt will perform initial HEP with mod I using paper handout to maximize functional gains made in PT. Target date:07/03/15   PT SHORT TERM GOAL #2   Title Complete Berg and improve score by 5 points from baseline to indicate improved functional standing balance. Target date:07/03/15   Baseline 10/31: baseline Berg score = 42/56    PT SHORT TERM GOAL #3   Title Pt will decrease TUG from 25.10 sec to < 20 sec to indicate increased efficiency of mobility. Target date:07/03/15   PT SHORT TERM GOAL #4   Title Pt will increase gait velocity from 1.23 ft/sec to 1.53 ft/sec to indicate increased efficiency of ambulation. Target date:07/03/15   PT SHORT TERM GOAL #5   Title Pt will negotiate 4 stairs with 2 rails (either side to simulate home entrance) while safely managing SPC to indicate increased safety using primary entrance of home.  Target date:07/03/15   PT SHORT TERM GOAL #6   Title Pt will ambulate 200' over level, indoor surfaces with mod I with proper use of LRAD to indicate increased safety with household mobility, safe use of AD. Target date:07/03/15           PT Long Term Goals - 06/11/15 [id number]    PT  LONG TERM GOAL #1   Title Pt will verbalize understanding of CVA warning signs, pertinent risk factors to prevent future CVA. Target date: 07/31/15   PT LONG TERM GOAL #2   Title Pt will verbalize understanding of fall prevention strategies to decrease fall risk in home environment. Target date: 07/31/15   PT LONG TERM GOAL #3   Title Pt will negotiate standard ramp and curb step with mod I using LRAD to increase pt safety/independence traversing community obstacles. Target date: 07/31/15   PT LONG TERM GOAL #4   Title Pt will ambulate 500' over unlevel, paved surfaces with mod I using LRAD to indicate increased safety with limited community mobility. Target date: 07/31/15   PT LONG TERM GOAL #5   Title Pt will increase gait velocity from 1.23 ft/sec to > / = 1.83 ft/sec to indicate decreased risk of recurrent falls. Target date: 07/31/15   PT LONG TERM GOAL #6   Title Pt will improve Berg score by 8 points from baseline to indicate significant improvement in functional standing balance. Target date: 07/31/15   Baseline 10/31: baseline Berg score = 42/56   PT LONG TERM GOAL #7   Title Pt will decrease TUG time  from 25.10 sec to < 15 seconds to indicate decreased fall risk. Target date: 07/31/15               Plan - 06/23/15 1311    Clinical Impression Statement Session focused on assessing need to modifications to current R solid AFO. Orthotist (present) recommended that current AFO: 1) be modified to articulating, as opposed to solid back; 2) add DF assist. Orthotist unsure if cutting off base to sulcus will greatly benefit pt or improve gait pattern. Pt required frequent redirection during this session due to perseveration on subjects we have discussed numerous times (e.g. pt perception that brain can not heal after CVA; pt perceived inability to perform any activities/exercises at home). Spoke with brother post-session, recommending that brother be present for therapy sessions to promote effective carryover. Brother in full agreement.   Pt will benefit from skilled therapeutic intervention in order to improve on the following deficits Abnormal gait;Decreased cognition;Decreased knowledge of use of DME;Decreased coordination;Decreased mobility;Decreased endurance;Decreased activity tolerance;Decreased balance;Decreased range of motion;Decreased safety awareness;Impaired UE functional use;Decreased strength;Impaired tone;Impaired flexibility   Rehab Potential Good   Clinical Impairments Affecting Rehab Potential Transportation limitations (pt does not drive; brother works FT)   PT Frequency 2x / week   PT Duration 8 weeks   PT Treatment/Interventions DME Instruction;Electrical Stimulation;Gait training;Stair training;Functional mobility training;Therapeutic activities;Therapeutic exercise;Balance training;Patient/family education;Orthotic Fit/Training;Neuromuscular re-education;Manual techniques;Vestibular   PT Next Visit Plan Try different assistive devices (RW with R hand orthosis vs. SPC vs. L forearm crutch), as SBQC promotes flexed posture and downward gaze. If brother present, go over HEP.    Recommended Other Services 11/11: brother planning to contact [employer] to make appt to modify custom solid R AFO to articulating AFO with DF assist.   Consulted and Agree with Plan of Care Patient;Family member/caregiver   Family Member Consulted brother, [place]        Problem List Patient Active Problem List   Diagnosis Date Noted  . OSA (obstructive sleep apnea) 10/11/2014  . Excessive daytime sleepiness 10/11/2014  . Hemiparesis (HCC) 10/11/2014  . Dysarthria 10/11/2014  . Pneumoperitoneum 05/01/2013  . Lower GI bleed 05/01/2013  . UTI (urinary tract infection) 05/01/2013  . Supratherapeutic INR 05/01/2013  . Chronic indwelling Foley catheter 05/01/2013  . Jejunostomy tube  present (?originally G tube?) 05/01/2013  . Laceration of eyebrow s/p repair 04/26/2013 05/01/2013  . 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 Hemiparesis affecting right side as late effect of stroke (HCC) 05/01/2013  . 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 DVT of right axillary vein, acute 05/01/2013  . CVA (cerebral vascular accident) (HCC) 05/01/2013  . Anemia 05/01/2013  . Dysphagia    Jorje Guild, PT, DPT [facility] [address] [place], [place], [address] Phone: [phone]   Fax:  [phone] 06/23/2015, 1:21 PM  Name: [name] [name] MRN: [id number] Date of Birth: 09/19/1955

## 2015-06-25 ENCOUNTER — Ambulatory Visit: Payer: BLUE CROSS/BLUE SHIELD | Admitting: Rehabilitation

## 2015-06-25 ENCOUNTER — Encounter: Payer: Self-pay | Admitting: Rehabilitation

## 2015-06-25 DIAGNOSIS — R269 Unspecified abnormalities of gait and mobility: Secondary | ICD-10-CM

## 2015-06-25 DIAGNOSIS — IMO0002 Reserved for concepts with insufficient information to code with codable children: Secondary | ICD-10-CM

## 2015-06-25 DIAGNOSIS — R2681 Unsteadiness on feet: Secondary | ICD-10-CM

## 2015-06-25 NOTE — Patient Instructions (Signed)
Hip Flexor Stretch    Lying on back near edge of bed, bend one leg, and lower R leg off the edge of the bed. Make sure right foot is flat.  Hold for __1__ minutes. Repeat _2___ times. Do __2__ sessions per day. Advanced Exercise: Bend knee back keeping thigh in contact with bed.  http://gt2.exer.us/346   Copyright  VHI. All rights reserved.

## 2015-06-25 NOTE — Therapy (Signed)
[facility] [address] [place], [place], [address] Phone: [phone]   Fax:  [phone]  Physical Therapy Treatment  Patient Details  Name: [name] [name] MRN: [id number] Date of Birth: January 28, 1956 Referring Provider: Naomie Dean, MD  Encounter Date: 06/25/2015      PT End of Session - 06/25/15 0838    Visit Number 5   Number of Visits 17   Date for PT Re-Evaluation 08/04/15   Authorization Type BCBS; 20 visit limit for PT   Authorization - Visit Number 58   Authorization - Number of Visits 20   PT Start Time 0803   PT Stop Time 0835   PT Time Calculation (min) 32 min   Activity Tolerance Patient tolerated treatment well;Treatment limited secondary to medical complications (Comment)  Limited by internal distraction   Behavior During Therapy Restless      Past Medical History  Diagnosis Date  . Dysphagia   . DVT (deep venous thrombosis) (HCC)   . Hypertension   . Depression   . 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 Enlarged prostate     Past Surgical History  Procedure Laterality Date  . Esophagoscopy w/ percutaneous gastrostomy tube placement  04/12/2013    Dr Murrell Converse, [facility]    There were no vitals filed for this visit.  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 Pt's brother's goals: to improve safety  awareness, to increase pt safety going up stairs with cane.   Currently in Pain? Yes   Pain Score 2    Pain Location Finger (Comment which one)  thumb   Pain Orientation Left   Pain Descriptors / Indicators Aching   Pain Type Acute pain   Pain Onset Today                 Therex:  Performed HEP with brother present during session in order to educate on how to perform correctly and assist as needed and provide cues as needed.  Performed supine BLE bridging x 10 reps with cues for R foot placement, R hip flex/quad stretch x 2 reps of 60 secs, sit<>stand with L hand on lap to increase LE strengthening.  Cues for increased midline orientation as well as for brother to provide these cues at home (performed x 10 reps), standing hip flex with use of LUE support on counter top x 10 reps BLE, and standing hip abd x 10 reps with LUE support on counter.  Tactile and verbal cues for upright posture and to look ahead rather than down at feet.                    PT Education - 06/25/15 [phone]    Education provided Yes   Education Details Education to brother and pt on how to perform exercises properly and also cues to provide  to pt (from brother) on how to perform.  Added hip flex/quad stretch with cues to brother on how to assist at home.    Person(s) Educated Patient;Other (comment)  brother   Methods Explanation;Demonstration;Handout;Tactile cues;Verbal cues   Comprehension Verbalized understanding;Returned demonstration;Need further instruction          PT Short Term Goals - 06/11/15 0924    PT SHORT TERM GOAL #1   Title Pt will perform initial HEP with mod I using paper handout to maximize functional gains made in PT. Target date:07/03/15   PT SHORT TERM GOAL #2   Title Complete Berg and improve score by 5 points from baseline to indicate improved functional standing balance. Target date:07/03/15   Baseline 10/31: baseline Berg score = 42/56   PT SHORT TERM GOAL #3   Title  Pt will decrease TUG from 25.10 sec to < 20 sec to indicate increased efficiency of mobility. Target date:07/03/15   PT SHORT TERM GOAL #4   Title Pt will increase gait velocity from 1.23 ft/sec to 1.53 ft/sec to indicate increased efficiency of ambulation. Target date:07/03/15   PT SHORT TERM GOAL #5   Title Pt will negotiate 4 stairs with 2 rails (either side to simulate home entrance) while safely managing SPC to indicate increased safety using primary entrance of home.  Target date:07/03/15   PT SHORT TERM GOAL #6   Title Pt will ambulate 200' over level, indoor surfaces with mod I with proper use of LRAD to indicate increased safety with household mobility, safe use of AD. Target date:07/03/15           PT Long Term Goals - 06/11/15 0925    PT LONG TERM GOAL #1   Title Pt will verbalize understanding of CVA warning signs, pertinent risk factors to prevent future CVA. Target date: 07/31/15   PT LONG TERM GOAL #2   Title Pt will verbalize understanding of fall prevention strategies to decrease fall risk in home environment. Target date: 07/31/15   PT LONG TERM GOAL #3   Title Pt will negotiate standard ramp and curb step with mod I using LRAD to increase pt safety/independence traversing community obstacles. Target date: 07/31/15   PT LONG TERM GOAL #4   Title Pt will ambulate 500' over unlevel, paved surfaces with mod I using LRAD to indicate increased safety with limited community mobility. Target date: 07/31/15   PT LONG TERM GOAL #5   Title Pt will increase gait velocity from 1.23 ft/sec to > / = 1.83 ft/sec to indicate decreased risk of recurrent falls. Target date: 07/31/15   PT LONG TERM GOAL #6   Title Pt will improve Berg score by 8 points from baseline to indicate significant improvement in functional standing balance. Target date: 07/31/15   Baseline 10/31: baseline Berg score = 42/56   PT LONG TERM GOAL #7   Title Pt will decrease TUG time from 25.10 sec to < 15 seconds to  indicate decreased fall risk. Target date: 07/31/15               Plan - 06/25/15 1610    Clinical Impression Statement Skilled session focused on education and performance of HEP with brother present during session in order to educate on how to cue pt for technique, safety and posture during activities.  Pt continues to be easily distracted and perseverates on not having sturdy mat to perform stretch on, however educated on how can do from bed or couch at home.  Brother  verbalized understanding.  Also continued to educate both pt and brother that they will need to call [employer] to make adjustements to brace.    Pt will benefit from skilled therapeutic intervention in order to improve on the following deficits Abnormal gait;Decreased cognition;Decreased knowledge of use of DME;Decreased coordination;Decreased mobility;Decreased endurance;Decreased activity tolerance;Decreased balance;Decreased range of motion;Decreased safety awareness;Impaired UE functional use;Decreased strength;Impaired tone;Impaired flexibility   Rehab Potential Good   Clinical Impairments Affecting Rehab Potential Transportation limitations (pt does not drive; brother works FT)   PT Frequency 2x / week   PT Duration 8 weeks   PT Treatment/Interventions DME Instruction;Electrical Stimulation;Gait training;Stair training;Functional mobility training;Therapeutic activities;Therapeutic exercise;Balance training;Patient/family education;Orthotic Fit/Training;Neuromuscular re-education;Manual techniques;Vestibular   PT Next Visit Plan Try different assistive devices (RW with R hand orthosis vs. SPC vs. L forearm crutch), as SBQC promotes flexed posture and downward gaze.    Consulted and Agree with Plan of Care Patient;Family member/caregiver   Family Member Consulted brother, [place]        Problem List Patient Active Problem List   Diagnosis Date Noted  . OSA (obstructive sleep apnea) 10/11/2014  . Excessive daytime  sleepiness 10/11/2014  . Hemiparesis (HCC) 10/11/2014  . Dysarthria 10/11/2014  . Pneumoperitoneum 05/01/2013  . Lower GI bleed 05/01/2013  . UTI (urinary tract infection) 05/01/2013  . Supratherapeutic INR 05/01/2013  . Chronic indwelling Foley catheter 05/01/2013  . Jejunostomy tube present (?originally G tube?) 05/01/2013  . Laceration of eyebrow s/p repair 04/26/2013 05/01/2013  . 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 Hemiparesis affecting right side as late effect of stroke (HCC) 05/01/2013  . 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 DVT of right axillary vein, acute 05/01/2013  . CVA (cerebral vascular accident) (HCC) 05/01/2013  . Anemia 05/01/2013  . Dysphagia    Harriet ButteEmily Alaija Ruble, PT, MPT [facility] [address] [place], [place], [address] Phone: [phone]   Fax:  [phone] 06/25/2015, 8:41 AM  Name: [name] NissenRobert [name] MRN: [id number] Date of Birth: [birth year]

## 2015-06-27 ENCOUNTER — Ambulatory Visit: Payer: BLUE CROSS/BLUE SHIELD | Admitting: Occupational Therapy

## 2015-06-27 DIAGNOSIS — M62838 Other muscle spasm: Secondary | ICD-10-CM

## 2015-06-27 DIAGNOSIS — R278 Other lack of coordination: Secondary | ICD-10-CM

## 2015-06-27 DIAGNOSIS — I69359 Hemiplegia and hemiparesis following cerebral infarction affecting unspecified side: Secondary | ICD-10-CM

## 2015-06-27 DIAGNOSIS — R269 Unspecified abnormalities of gait and mobility: Secondary | ICD-10-CM | POA: Diagnosis not present

## 2015-06-27 DIAGNOSIS — R279 Unspecified lack of coordination: Secondary | ICD-10-CM

## 2015-06-27 DIAGNOSIS — IMO0002 Reserved for concepts with insufficient information to code with codable children: Secondary | ICD-10-CM

## 2015-06-27 NOTE — Therapy (Signed)
[facility] [address] [place], [place], [address] Phone: [phone]   Fax:  [phone]  Occupational Therapy Treatment  Patient Details  Name: [name] NissenRobert Fees MRN: [id number] Date of Birth: 1956/07/16 Referring Provider: Dr. Lucia GaskinsAhern  Encounter Date: 06/27/2015      OT End of Session - 06/27/15 0815    Visit Number 4   Number of Visits 17   Date for OT Re-Evaluation 08/03/15   Authorization Type BCBS   Authorization Time Period 20 visit limit   Authorization - Visit Number 4   Authorization - Number of Visits 20   OT Start Time 0806   OT Stop Time 0845   OT Time Calculation (min) 39 min   Activity Tolerance Patient tolerated treatment well   Behavior During Therapy [facility] for tasks assessed/performed      Past Medical History  Diagnosis Date  . Dysphagia   . DVT (deep venous thrombosis) (HCC)   . Hypertension   . Depression   . 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 Enlarged prostate     Past Surgical History  Procedure Laterality Date  . Esophagoscopy w/ percutaneous gastrostomy tube placement  04/12/2013    Dr Murrell [facility]verseHildreth[facility]    There were no vitals filed for this visit.  Visit Diagnosis:  Weakness due to cerebrovascular accident  Muscle spasticity  Hemiparesis affecting dominant side as late effect of cerebrovascular accident [facility](HCC)  Decreased coordination      Subjective Assessment - 06/27/15 0812    Subjective  finger pain   Pertinent History see epic    Patient Stated Goals to use arm again, write name   Currently in Pain? Yes   Pain Score 2    Pain Location Finger (Comment which one)   Pain Orientation Left   Pain Descriptors / Indicators Aching   Pain Type Acute pain   Pain Onset Today   Aggravating Factors  malposistioning   Pain Relieving  Factors reposistioning   Multiple Pain Sites No          Treatment: Therapist had pt perform tableslides for shoulder flexion and horizontal abduction, 10 reps each min-mod v.c. MNMES 50 pps, 250 pw, 10 secs cycle, intensity 24, to wrist and finger extensors, with therapist facilitating wrist and finger extension. Pt demonstrates some clawaing so this was discontinued after 5 mins. Therapistt initiated fabrication/ fitting of resting hand splint. therapist did not issue/complete due to time constraints.                      OT Short Term Goals - 06/06/15 1142    OT SHORT TERM GOAL #1   Title I with HEP.   Time 4   Period Weeks   Status New   OT SHORT TERM GOAL #2   Title Pt will verbalize understanding of RUE positioning to minimize risk for injury, pain  and contracture.   Time 4   Period Weeks   Status New   OT SHORT TERM GOAL #3   Title Pt will demonstrate A/ROM shoulder flexion of 60* in prep for functional reach.   Time 4   Period Weeks   Status New   OT SHORT TERM GOAL #4   Title Pt will demonstrate ability to write his name with 70% legibility   Time 4  Period Weeks   Status New           OT Long Term Goals - 06/06/15 1143    OT LONG TERM GOAL #1   Title Pt will demonstrate ability to perform light home management / cooking modified independently demonstrating good safety awareness.   Time 8   Period Weeks   Status New   OT LONG TERM GOAL #2   Title Pt will report ability to use RUE as an active assist at least 50% of the time for light home management/ ADLs.   Time 8   Period Weeks   Status New   OT LONG TERM GOAL #3   Title Pt will demonstrate improved RUE functional use as evidenced by performing 10 blocks on box/ blocks with RUE.   Time 8   Period Weeks   Status New   OT LONG TERM GOAL #4   Title Pt will demonstrate ability to write his name with 90% legibility   Time 8   Period Weeks   Status New               Plan -  06/27/15 0814    Clinical Impression Statement Pt is progressing towards goals yet pt is limited by spasticity and cognitive deficits.   Pt will benefit from skilled therapeutic intervention in order to improve on the following deficits (Retired) Abnormal gait;Decreased coordination;Decreased range of motion;Difficulty walking;Impaired flexibility;Decreased endurance;Decreased safety awareness;Increased edema;Impaired sensation;Obesity;Decreased balance;Decreased knowledge of use of DME;Impaired UE functional use;Pain;Impaired perceived functional ability;Decreased strength;Decreased mobility;Decreased cognition;Impaired tone;Decreased activity tolerance;Decreased knowledge of precautions   Rehab Potential Good   Clinical Impairments Affecting Rehab Potential spasticity   OT Frequency 2x / week   OT Duration 8 weeks   OT Treatment/Interventions Self-care/ADL training;Therapeutic exercise;Patient/family education;Balance training;Splinting;Manual Therapy;Neuromuscular education;Ultrasound;Iontophoresis;Therapeutic exercises;Therapeutic activities;DME and/or AE instruction;Parrafin;Cryotherapy;Electrical Stimulation;Fluidtherapy;Gait Training;Cognitive remediation/compensation;Visual/perceptual remediation/compensation;Passive range of motion;Contrast Bath;Moist Heat   Consulted and Agree with Plan of Care Patient        Problem List Patient Active Problem List   Diagnosis Date Noted  . OSA (obstructive sleep apnea) 10/11/2014  . Excessive daytime sleepiness 10/11/2014  . Hemiparesis (HCC) 10/11/2014  . Dysarthria 10/11/2014  . Pneumoperitoneum 05/01/2013  . Lower GI bleed 05/01/2013  . UTI (urinary tract infection) 05/01/2013  . Supratherapeutic INR 05/01/2013  . Chronic indwelling Foley catheter 05/01/2013  . Jejunostomy tube present (?originally G tube?) 05/01/2013  . Laceration of eyebrow s/p repair 04/26/2013 05/01/2013  . 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  Hemiparesis affecting right side as late effect of stroke (HCC) 05/01/2013  . 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 DVT of right axillary vein, acute 05/01/2013  . CVA (cerebral vascular accident) (HCC) 05/01/2013  . Anemia 05/01/2013  . Dysphagia     RINE,KATHRYN 06/27/2015, 8:16 AM Keene Breath, OTR/L [phone] Phone: [phone] 8:16 AM 06/27/2015 [facility] [address] [place], [place], [address] Phone: [phone]   Fax:  [phone]  Name: Anup Brigham MRN: [id number] Date of Birth: Nov 30, 1955

## 2015-06-29 ENCOUNTER — Ambulatory Visit: Payer: BLUE CROSS/BLUE SHIELD | Admitting: Rehabilitation

## 2015-06-29 ENCOUNTER — Encounter: Payer: Self-pay | Admitting: Rehabilitation

## 2015-06-29 ENCOUNTER — Ambulatory Visit: Payer: BLUE CROSS/BLUE SHIELD | Admitting: Occupational Therapy

## 2015-06-29 DIAGNOSIS — R269 Unspecified abnormalities of gait and mobility: Secondary | ICD-10-CM

## 2015-06-29 DIAGNOSIS — M62838 Other muscle spasm: Secondary | ICD-10-CM

## 2015-06-29 DIAGNOSIS — R2681 Unsteadiness on feet: Secondary | ICD-10-CM

## 2015-06-29 DIAGNOSIS — IMO0002 Reserved for concepts with insufficient information to code with codable children: Secondary | ICD-10-CM

## 2015-06-29 DIAGNOSIS — I69319 Unspecified symptoms and signs involving cognitive functions following cerebral infarction: Secondary | ICD-10-CM

## 2015-06-29 NOTE — Therapy (Signed)
[facility] [address] [place], [place], [address] Phone: [phone]   Fax:  [phone]  Occupational Therapy Treatment  Patient Details  Name: [name] [name] MRN: [id number] Date of Birth: 29-Aug-1955 Referring Provider: Dr. Lucia Gaskins  Encounter Date: 06/29/2015      OT End of Session - 06/29/15 1312    Visit Number 5   Number of Visits 17   Date for OT Re-Evaluation 08/03/15   Authorization Type BCBS   Authorization Time Period 20 visit limit   Authorization - Visit Number 5   Authorization - Number of Visits 20   OT Start Time 1148   OT Stop Time 1218   OT Time Calculation (min) 30 min   Activity Tolerance Patient tolerated treatment well   Behavior During Therapy Restless      Past Medical History  Diagnosis Date  . Dysphagia   . DVT (deep venous thrombosis) (HCC)   . Hypertension   . Depression   . 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 Enlarged prostate     Past Surgical History  Procedure Laterality Date  . Esophagoscopy w/ percutaneous gastrostomy tube placement  04/12/2013    Dr Murrell Converse, [facility]    There were no vitals filed for this visit.  Visit Diagnosis:  Muscle spasticity  Weakness due to cerebrovascular accident  Cognitive deficits following cerebral infarction      Subjective Assessment - 06/29/15 1310    Pertinent History see epic    Patient Stated Goals to use arm again, write name   Currently in Pain? Yes   Pain Score 3    Pain Location Shoulder   Pain Orientation Right   Pain Descriptors / Indicators Aching   Pain Onset More than a month ago   Aggravating Factors  malposistioning   Pain Relieving Factors reposistioning   Multiple Pain Sites No       Treatment: Pt performed tableslides for shoulder flexion and abduction while  therapist finished adjustments to splint.  Therapist finished fabrication and fitting of resting hand splint. Pt applied 1x with mod v.c for position/ application.. Due to time constraints and pt's cognitive deficits, therapist did not issue splint.  Will plan to review with pt/ caregiver next visit then issue.                         OT Short Term Goals - 06/06/15 1142    OT SHORT TERM GOAL #1   Title I with HEP.   Time 4   Period Weeks   Status New   OT SHORT TERM GOAL #2   Title Pt will verbalize understanding of RUE positioning to minimize risk for injury, pain  and contracture.   Time 4   Period Weeks   Status New   OT SHORT TERM GOAL #3   Title Pt will demonstrate A/ROM shoulder flexion of 60* in prep for functional reach.   Time 4   Period Weeks   Status New   OT SHORT TERM GOAL #4   Title Pt will demonstrate ability to write his name with 70% legibility   Time 4   Period Weeks   Status New           OT Long Term Goals - 06/06/15 1143    OT LONG TERM GOAL #  1   Title Pt will demonstrate ability to perform light home management / cooking modified independently demonstrating good safety awareness.   Time 8   Period Weeks   Status New   OT LONG TERM GOAL #2   Title Pt will report ability to use RUE as an active assist at least 50% of the time for light home management/ ADLs.   Time 8   Period Weeks   Status New   OT LONG TERM GOAL #3   Title Pt will demonstrate improved RUE functional use as evidenced by performing 10 blocks on box/ blocks with RUE.   Time 8   Period Weeks   Status New   OT LONG TERM GOAL #4   Title Pt will demonstrate ability to write his name with 90% legibility   Time 8   Period Weeks   Status New               Plan - 06/29/15 1311    Clinical Impression Statement Pt is progressing towards goals limited by short term memory deficits.   Pt will benefit from skilled therapeutic intervention in order to improve  on the following deficits (Retired) Abnormal gait;Decreased coordination;Decreased range of motion;Difficulty walking;Impaired flexibility;Decreased endurance;Decreased safety awareness;Increased edema;Impaired sensation;Obesity;Decreased balance;Decreased knowledge of use of DME;Impaired UE functional use;Pain;Impaired perceived functional ability;Decreased strength;Decreased mobility;Decreased cognition;Impaired tone;Decreased activity tolerance;Decreased knowledge of precautions   Rehab Potential Good   Clinical Impairments Affecting Rehab Potential spasticity   OT Frequency 2x / week   OT Duration 8 weeks   OT Treatment/Interventions Self-care/ADL training;Therapeutic exercise;Patient/family education;Balance training;Splinting;Manual Therapy;Neuromuscular education;Ultrasound;Iontophoresis;Therapeutic exercises;Therapeutic activities;DME and/or AE instruction;Parrafin;Cryotherapy;Electrical Stimulation;Fluidtherapy;Gait Training;Cognitive remediation/compensation;Visual/perceptual remediation/compensation;Passive range of motion;Contrast Bath;Moist Heat   Plan rreview splint application and wear/ care with pt/ caregiver then issue.   Consulted and Agree with Plan of Care Patient;Family member/caregiver   Family Member Consulted brother        Problem List Patient Active Problem List   Diagnosis Date Noted  . OSA (obstructive sleep apnea) 10/11/2014  . Excessive daytime sleepiness 10/11/2014  . Hemiparesis (HCC) 10/11/2014  . Dysarthria 10/11/2014  . Pneumoperitoneum 05/01/2013  . Lower GI bleed 05/01/2013  . UTI (urinary tract infection) 05/01/2013  . Supratherapeutic INR 05/01/2013  . Chronic indwelling Foley catheter 05/01/2013  . Jejunostomy tube present (?originally G tube?) 05/01/2013  . Laceration of eyebrow s/p repair 04/26/2013 05/01/2013  . 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 Hemiparesis affecting right side as late effect of stroke (HCC) 05/01/2013  .  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 DVT of right axillary vein, acute 05/01/2013  . CVA (cerebral vascular accident) (HCC) 05/01/2013  . Anemia 05/01/2013  . Dysphagia     RINE,KATHRYN 06/29/2015, 1:14 PM  [facility] [address] [place], [place], [address] Phone: [phone]   Fax:  [phone]  Name: Adella NissenRobert Larimer MRN: [id number] Date of Birth: 16-Jul-1956

## 2015-06-29 NOTE — Therapy (Signed)
[facility] [address] [place], [place], [address] Phone: [phone]   Fax:  [phone]  Physical Therapy Treatment  Patient Details  Name: [name] [name] MRN: [id number] Date of Birth: 12/01/1955 Referring Provider: Naomie Dean, MD  Encounter Date: 06/29/2015      PT End of Session - 06/29/15 1154    Visit Number 6   Number of Visits 17   Date for PT Re-Evaluation 08/04/15   Authorization Type [employer]; 20 visit limit for PT   Authorization - Visit Number 6   Authorization - Number of Visits 20   PT Start Time 1101   PT Stop Time 1145   PT Time Calculation (min) 44 min   Activity Tolerance Patient tolerated treatment well;Treatment limited secondary to medical complications (Comment)  Limited by internal distraction   Behavior During Therapy Restless      Past Medical History  Diagnosis Date  . Dysphagia   . DVT (deep venous thrombosis) (HCC)   . Hypertension   . Depression   . 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 Enlarged prostate     Past Surgical History  Procedure Laterality Date  . Esophagoscopy w/ percutaneous gastrostomy tube placement  04/12/2013    Dr Murrell Converse, [facility]    There were no vitals filed for this visit.  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 Pt's brother's goals: to  improve safety awareness, to increase pt safety going up stairs with cane.   Currently in Pain? Yes   Pain Score 3    Pain Location Shoulder   Pain Orientation Right   Pain Descriptors / Indicators Aching   Pain Type Acute pain   Pain Onset Today   Aggravating Factors  increased WB   Pain Relieving Factors repositioning                Self Care:  Re-educated pt and brother on R hip flexor and quad stretch with RLE off EOB and knee in flexed position.  Pt perseverative on the fact that he is unable to perform on his own at home.   Cues and education that due to increase extensor tone, he would need to have brother assist.  Attempted to problem solve various positions for pt to stretch in, however EOB was most effective.  Performed squats with use of LUE support x 10 reps with cues for technique to increase hip and knee flex in closed chain.    Gait training:  Trialed use of RW with R hand orthosis.  Note that clinic only had L HO, therefore utilized this to best ability with strap to keep RUE in place.  Requires multi modal cues for posture and equal WB throughout as well as increasing WB through RW as he tended to tip RW due to avoidance of RUE/LE activation.  As he continued  with gait (performed total of 230' x 2 reps) note that midline posture improved, upward gaze improved and note increased WB.  Feel that he would benefit from continued practice with this in clinic before getting for home use.  Pt verbalizing "I can't use my R hand, my R hand won't stay there" during session with max cues to attempt and benefits of using RW.                   PT Education - 06/29/15 1153    Education provided Yes   Education Details Max education to pt and brother that brother would have to assist with quad/hip flex stretch at this time.  Problem solved various ways to stretch, however most effective is RLE off EOB which brother will have to assist with.    Person(s) Educated  Patient;Other (comment)  brother   Methods Explanation;Demonstration   Comprehension Verbalized understanding;Returned demonstration          PT Short Term Goals - 06/11/15 0924    PT SHORT TERM GOAL #1   Title Pt will perform initial HEP with mod I using paper handout to maximize functional gains made in PT. Target date:07/03/15   PT SHORT TERM GOAL #2   Title Complete Berg and improve score by 5 points from baseline to indicate improved functional standing balance. Target date:07/03/15   Baseline 10/31: baseline Berg score = 42/56   PT SHORT TERM GOAL #3   Title Pt will decrease TUG from 25.10 sec to < 20 sec to indicate increased efficiency of mobility. Target date:07/03/15   PT SHORT TERM GOAL #4   Title Pt will increase gait velocity from 1.23 ft/sec to 1.53 ft/sec to indicate increased efficiency of ambulation. Target date:07/03/15   PT SHORT TERM GOAL #5   Title Pt will negotiate 4 stairs with 2 rails (either side to simulate home entrance) while safely managing SPC to indicate increased safety using primary entrance of home.  Target date:07/03/15   PT SHORT TERM GOAL #6   Title Pt will ambulate 200' over level, indoor surfaces with mod I with proper use of LRAD to indicate increased safety with household mobility, safe use of AD. Target date:07/03/15           PT Long Term Goals - 06/11/15 0925    PT LONG TERM GOAL #1   Title Pt will verbalize understanding of CVA warning signs, pertinent risk factors to prevent future CVA. Target date: 07/31/15   PT LONG TERM GOAL #2   Title Pt will verbalize understanding of fall prevention strategies to decrease fall risk in home environment. Target date: 07/31/15   PT LONG TERM GOAL #3   Title Pt will negotiate standard ramp and curb step with mod I using LRAD to increase pt safety/independence traversing community obstacles. Target date: 07/31/15   PT LONG TERM GOAL #4   Title Pt will ambulate 500' over unlevel, paved surfaces with  mod I using LRAD to indicate increased safety with limited community mobility. Target date: 07/31/15   PT LONG TERM GOAL #5   Title Pt will increase gait velocity from 1.23 ft/sec to > / = 1.83 ft/sec to indicate decreased risk of recurrent falls. Target date: 07/31/15   PT LONG TERM GOAL #6   Title Pt will improve Berg score by 8 points from baseline to indicate significant improvement in functional standing balance. Target date: 07/31/15   Baseline 10/31: baseline Berg score = 42/56   PT LONG  TERM GOAL #7   Title Pt will decrease TUG time from 25.10 sec to < 15 seconds to indicate decreased fall risk. Target date: 07/31/15               Plan - 06/29/15 1155    Clinical Impression Statement Skilled session focused on education and performance of stretching R hip flex and quad muscle safely at home.  Feel that brother will have to assist with this, however pt very perseverative on him doing them without brother.  Provided re-direction and education as able.  Also address gait with use of RW and R hand splint to increase RUE/LE WB with midline posture.    Pt will benefit from skilled therapeutic intervention in order to improve on the following deficits Abnormal gait;Decreased cognition;Decreased knowledge of use of DME;Decreased coordination;Decreased mobility;Decreased endurance;Decreased activity tolerance;Decreased balance;Decreased range of motion;Decreased safety awareness;Impaired UE functional use;Decreased strength;Impaired tone;Impaired flexibility   Rehab Potential Good   Clinical Impairments Affecting Rehab Potential Transportation limitations (pt does not drive; brother works FT)   PT Frequency 2x / week   PT Duration 8 weeks   PT Treatment/Interventions DME Instruction;Electrical Stimulation;Gait training;Stair training;Functional mobility training;Therapeutic activities;Therapeutic exercise;Balance training;Patient/family education;Orthotic Fit/Training;Neuromuscular  re-education;Manual techniques;Vestibular   PT Next Visit Plan Continue to trial RW with R hand orthosis (had to improvise with L splint during last visit, Diannia RuderKara has ordered them).    Consulted and Agree with Plan of Care Patient;Family member/caregiver   Family Member Consulted brother, Aywood        Problem List Patient Active Problem List   Diagnosis Date Noted  . OSA (obstructive sleep apnea) 10/11/2014  . Excessive daytime sleepiness 10/11/2014  . Hemiparesis (HCC) 10/11/2014  . Dysarthria 10/11/2014  . Pneumoperitoneum 05/01/2013  . Lower GI bleed 05/01/2013  . UTI (urinary tract infection) 05/01/2013  . Supratherapeutic INR 05/01/2013  . Chronic indwelling Foley catheter 05/01/2013  . Jejunostomy tube present (?originally G tube?) 05/01/2013  . Laceration of eyebrow s/p repair 04/26/2013 05/01/2013  . 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 Hemiparesis affecting right side as late effect of stroke (HCC) 05/01/2013  . 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 DVT of right axillary vein, acute 05/01/2013  . CVA (cerebral vascular accident) (HCC) 05/01/2013  . Anemia 05/01/2013  . Dysphagia     Harriet ButteEmily Obert Espindola, PT, MPT [facility] [address] [place], [place], [address] Phone: [phone]   Fax:  [phone] 06/29/2015, 11:58 AM  Name: [name] [name] MRN: [id number] Date of Birth: [birth year]

## 2015-07-02 ENCOUNTER — Encounter: Payer: Self-pay | Admitting: Rehabilitation

## 2015-07-02 ENCOUNTER — Ambulatory Visit: Payer: BLUE CROSS/BLUE SHIELD | Admitting: Rehabilitation

## 2015-07-02 ENCOUNTER — Ambulatory Visit: Payer: BLUE CROSS/BLUE SHIELD | Admitting: Occupational Therapy

## 2015-07-02 DIAGNOSIS — R2681 Unsteadiness on feet: Secondary | ICD-10-CM

## 2015-07-02 DIAGNOSIS — IMO0002 Reserved for concepts with insufficient information to code with codable children: Secondary | ICD-10-CM

## 2015-07-02 DIAGNOSIS — R269 Unspecified abnormalities of gait and mobility: Secondary | ICD-10-CM | POA: Diagnosis not present

## 2015-07-02 DIAGNOSIS — M62838 Other muscle spasm: Secondary | ICD-10-CM

## 2015-07-02 NOTE — Patient Instructions (Signed)
Your Splint This splint should initially be fitted by a healthcare practitioner.  The healthcare practitioner is responsible for providing wearing instructions and precautions to the patient, other healthcare practitioners and care provider involved in the patient's care.  This splint was custom made for you. Please read the following instructions to learn about wearing and caring for your splint.  Precautions Should your splint cause any of the following problems, remove the splint immediately and contact your therapist/physician.  Swelling  Severe Pain  Pressure Areas  Stiffness  Numbness  Do not wear your splint while operating machinery unless it has been fabricated for that purpose.  When To Wear Your Splint Where your splint according to your therapist/physician instructions. Daytime for 1 hours today, then remove and check skin for pressure areas and redness. If pressure areas or redness that does not go away after 10-15 mins, stop wearing splint and bring to your next appointment. If no sproblems you may wear splint for 2-3 hours at a time while resting(for example sitting down to watch TV). Remove splint for bathing, washing dishes or exercising. Care and Cleaning of Your Splint 1. Keep your splint away from open flames. 2. Your splint will lose its shape in temperatures over 135 degrees Farenheit, ( in car windows, near radiators, ovens or in hot water).  Never make any adjustments to your splint, if the splint needs adjusting remove it and make an appointment to see your therapist. 3. Your splint, including the cushion liner may be cleaned with soap and lukewarm water.  Do not immerse in hot water over 135 degrees Farenheit. 4. Straps may be washed with soap and water, but do not moisten the self-adhesive portion.

## 2015-07-02 NOTE — Therapy (Signed)
[facility] [address] [place], [place], [address] Phone: [phone]   Fax:  [phone]  Occupational Therapy Treatment  Patient Details  Name: [name] NissenRobert [name] MRN: [id number] Date of Birth: Aug 07, 1956 Referring Provider: Dr. Lucia [name]  Encounter Date: 07/02/2015      OT End of Session - 07/02/15 1301    Visit Number 6   Number of Visits 17   Date for OT Re-Evaluation 08/03/15   Authorization Time Period 20 visit limit   Authorization - Visit Number 6   Authorization - Number of Visits 20   OT Start Time 0806   OT Stop Time 0845   OT Time Calculation (min) 39 min   Activity Tolerance Patient tolerated treatment well   Behavior During Therapy [facility] for tasks assessed/performed      Past Medical History  Diagnosis Date  . Dysphagia   . DVT (deep venous thrombosis) (HCC)   . Hypertension   . Depression   . 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 Enlarged prostate     Past Surgical History  Procedure Laterality Date  . Esophagoscopy w/ percutaneous gastrostomy tube placement  04/12/2013    [name]    There were no vitals filed for this visit.  Visit Diagnosis:  Muscle spasticity  Weakness due to cerebrovascular accident      Subjective Assessment - 07/02/15 0820    Pertinent History see epic    Patient Stated Goals to use arm again, write name   Currently in Pain? No/denies         Treatment: Pt was instructed in splint application, wear schedule and precautions. Pt was instructed in how to gently stretch fingers and thumb prior to application. Pt donned and doffed splint multiple times and was able to do so correctly following instruction. Tableslides for shoulder flexion x 10 reps                     OT Education - 07/02/15 1304     Education Details splint wear, care and precautions   Person(s) Educated Patient   Methods Explanation;Demonstration;Verbal cues;Handout   Comprehension Verbalized understanding          OT Short Term Goals - 06/06/15 1142    OT SHORT TERM GOAL #1   Title I with HEP.   Time 4   Period Weeks   Status New   OT SHORT TERM GOAL #2   Title Pt will verbalize understanding of RUE positioning to minimize risk for injury, pain  and contracture.   Time 4   Period Weeks   Status New   OT SHORT TERM GOAL #3   Title Pt will demonstrate A/ROM shoulder flexion of 60* in prep for functional reach.   Time 4   Period Weeks   Status New   OT SHORT TERM GOAL #4   Title Pt will demonstrate ability to write his name with 70% legibility   Time 4   Period Weeks   Status New           OT Long Term Goals - 06/06/15 1143    OT LONG TERM GOAL #1   Title Pt will demonstrate ability to perform light home management / cooking modified independently demonstrating good safety awareness.   Time 8   Period Weeks   Status  New   OT LONG TERM GOAL #2   Title Pt will report ability to use RUE as an active assist at least 50% of the time for light home management/ ADLs.   Time 8   Period Weeks   Status New   OT LONG TERM GOAL #3   Title Pt will demonstrate improved RUE functional use as evidenced by performing 10 blocks on box/ blocks with RUE.   Time 8   Period Weeks   Status New   OT LONG TERM GOAL #4   Title Pt will demonstrate ability to write his name with 90% legibility   Time 8   Period Weeks   Status New               Plan - 07/02/15 1259    Clinical Impression Statement Pt is progressing towards goals. He practiced donning/ doffing splint multiple times and therapist reviewed precautions with pt. Pt verbalized understanding and returned demonstration.   Pt will benefit from skilled therapeutic intervention in order to improve on the following deficits (Retired) Abnormal  gait;Decreased coordination;Decreased range of motion;Difficulty walking;Impaired flexibility;Decreased endurance;Decreased safety awareness;Increased edema;Impaired sensation;Obesity;Decreased balance;Decreased knowledge of use of DME;Impaired UE functional use;Pain;Impaired perceived functional ability;Decreased strength;Decreased mobility;Decreased cognition;Impaired tone;Decreased activity tolerance;Decreased knowledge of precautions   Rehab Potential Good   Clinical Impairments Affecting Rehab Potential spasticity   OT Frequency 2x / week   OT Duration 8 weeks   OT Treatment/Interventions Self-care/ADL training;Therapeutic exercise;Patient/family education;Balance training;Splinting;Manual Therapy;Neuromuscular education;Ultrasound;Iontophoresis;Therapeutic exercises;Therapeutic activities;DME and/or AE instruction;Parrafin;Cryotherapy;Electrical Stimulation;Fluidtherapy;Gait Training;Cognitive remediation/compensation;Visual/perceptual remediation/compensation;Passive range of motion;Contrast Bath;Moist Heat   Plan splint check, progress HEP.   Consulted and Agree with Plan of Care Patient        Problem List Patient Active Problem List   Diagnosis Date Noted  . OSA (obstructive sleep apnea) 10/11/2014  . Excessive daytime sleepiness 10/11/2014  . Hemiparesis (HCC) 10/11/2014  . Dysarthria 10/11/2014  . Pneumoperitoneum 05/01/2013  . Lower GI bleed 05/01/2013  . UTI (urinary tract infection) 05/01/2013  . Supratherapeutic INR 05/01/2013  . Chronic indwelling Foley catheter 05/01/2013  . Jejunostomy tube present (?originally G tube?) 05/01/2013  . Laceration of eyebrow s/p repair 04/26/2013 05/01/2013  . 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 Hemiparesis affecting right side as late effect of stroke (HCC) 05/01/2013  . 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 DVT of right axillary vein, acute 05/01/2013  . CVA (cerebral vascular accident) (HCC) 05/01/2013   . Anemia 05/01/2013  . Dysphagia     [name] [name] 07/02/2015, 1:06 PM  [facility] [address] [place], [place], [address] Phone: [phone]   Fax:  [phone]  Name: [name] [name] MRN: [id number] Date of Birth: 1956-02-12

## 2015-07-02 NOTE — Therapy (Signed)
[facility] [address] [place], [place], [address] Phone: [phone]   Fax:  [phone]  Physical Therapy Treatment  Patient Details  Name: [name] [name] MRN: [id number] Date of Birth: 1956-07-25 Referring Provider: Naomie Dean, MD  Encounter Date: 07/02/2015      PT End of Session - 07/02/15 0851    Visit Number 7   Number of Visits 17   Date for PT Re-Evaluation 08/04/15   Authorization Type [employer]; 20 visit limit for PT   Authorization - Visit Number 6   Authorization - Number of Visits 20   PT Start Time [phone]   PT Stop Time 0930   PT Time Calculation (min) 44 min   Activity Tolerance Patient tolerated treatment well;Treatment limited secondary to medical complications (Comment)  Limited by internal distraction   Behavior During Therapy Restless      Past Medical History  Diagnosis Date  . Dysphagia   . DVT (deep venous thrombosis) (HCC)   . Hypertension   . Depression   . 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 Enlarged prostate     Past Surgical History  Procedure Laterality Date  . Esophagoscopy w/ percutaneous gastrostomy tube placement  04/12/2013    Dr Murrell Converse, [facility]    There were no vitals filed for this visit.  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 Pt's brother's goals: to improve safety awareness, to  increase pt safety going up stairs with cane.   Currently in Pain? No/denies              NMR:  Worked in tall kneeling in order to encourage R knee flex with R hip extension and R WB in order to carryover to gait.  While in tall kneeling provided facilitation at R hip for increased hip extension as well as at trunk for increased upright posture.  Worked on reaching to the R and upward to further increase WB and weight shift to the R.  Progressed to quadruped position (R hand placed on half disc for improved comfort due to increased tone in fingers and wrist).  Provided assist at R elbow for increased elbow extension as well as at axilla/trunk for increased R lateral weight shift.  Pt very hesitant to keep weight over to the R due to "brace making hip stuck" however could do with increased facilitation and cues from therapist.  Transitioned to gait training without AD in order to assess carryover from NMR activities.  Note that he tends to keep trunk hiked on the L and trunk leaning to the R, therefore provided visual feedback (with use of mirror) as well as facilitation for depression on R side and forward lateral weight shift on the R during stance.  He continues to demonstrate decreased stance time on RLE, however did improve somewhat during NMR/gait task.  Note little carryover from NMR to  gait with use of cane with pt stating "I need the cane to lean on because my R side is weak."  Continue to provide max education on importance of improving midline posture and WB on RLE to regain function and balance.  Also education during session to NOT perform tall kneeling and quadruped activity at home.                    PT Education - 07/02/15 0851    Education provided Yes   Education Details Education to NOT perform tall kneeling and quadruped tasks at home, exercises only for therapy.     Person(s) Educated Patient   Methods Explanation   Comprehension Verbalized understanding           PT Short Term Goals - 06/11/15 0924    PT SHORT TERM GOAL #1   Title Pt will perform initial HEP with mod I using paper handout to maximize functional gains made in PT. Target date:07/03/15   PT SHORT TERM GOAL #2   Title Complete Berg and improve score by 5 points from baseline to indicate improved functional standing balance. Target date:07/03/15   Baseline 10/31: baseline Berg score = 42/56   PT SHORT TERM GOAL #3   Title Pt will decrease TUG from 25.10 sec to < 20 sec to indicate increased efficiency of mobility. Target date:07/03/15   PT SHORT TERM GOAL #4   Title Pt will increase gait velocity from 1.23 ft/sec to 1.53 ft/sec to indicate increased efficiency of ambulation. Target date:07/03/15   PT SHORT TERM GOAL #5   Title Pt will negotiate 4 stairs with 2 rails (either side to simulate home entrance) while safely managing SPC to indicate increased safety using primary entrance of home.  Target date:07/03/15   PT SHORT TERM GOAL #6   Title Pt will ambulate 200' over level, indoor surfaces with mod I with proper use of LRAD to indicate increased safety with household mobility, safe use of AD. Target date:07/03/15           PT Long Term Goals - 06/11/15 0925    PT LONG TERM GOAL #1   Title Pt will verbalize understanding of CVA warning signs, pertinent risk factors to prevent future CVA. Target date: 07/31/15   PT LONG TERM GOAL #2   Title Pt will verbalize understanding of fall prevention strategies to decrease fall risk in home environment. Target date: 07/31/15   PT LONG TERM GOAL #3   Title Pt will negotiate standard ramp and curb step with mod I using LRAD to increase pt safety/independence traversing community obstacles. Target date: 07/31/15   PT LONG TERM GOAL #4   Title Pt will ambulate 500' over unlevel, paved surfaces with mod I using LRAD to indicate increased safety with limited community mobility. Target date: 07/31/15   PT LONG TERM GOAL #5   Title Pt  will increase gait velocity from 1.23 ft/sec to > / = 1.83 ft/sec to indicate decreased risk of recurrent falls. Target date: 07/31/15   PT LONG TERM GOAL #6   Title Pt will improve Berg score by 8 points from baseline to indicate significant improvement in functional standing balance. Target date: 07/31/15   Baseline 10/31: baseline Berg score = 42/56   PT LONG TERM GOAL #7   Title Pt will decrease TUG time from 25.10 sec to < 15 seconds to indicate decreased fall risk. Target date: 07/31/15  Plan - 07/02/15 0851    Clinical Impression Statement Skilled session focused on NMR to RUE/LE to allow stretching, increased knee flex with WB to decrease extensor tone, and increase WB and weight shift to the R to carry over to gait.    Pt will benefit from skilled therapeutic intervention in order to improve on the following deficits Abnormal gait;Decreased cognition;Decreased knowledge of use of DME;Decreased coordination;Decreased mobility;Decreased endurance;Decreased activity tolerance;Decreased balance;Decreased range of motion;Decreased safety awareness;Impaired UE functional use;Decreased strength;Impaired tone;Impaired flexibility   Rehab Potential Good   Clinical Impairments Affecting Rehab Potential Transportation limitations (pt does not drive; brother works FT)   PT Frequency 2x / week   PT Duration 8 weeks   PT Treatment/Interventions DME Instruction;Electrical Stimulation;Gait training;Stair training;Functional mobility training;Therapeutic activities;Therapeutic exercise;Balance training;Patient/family education;Orthotic Fit/Training;Neuromuscular re-education;Manual techniques;Vestibular   PT Next Visit Plan Continue to trial RW with R hand orthosis (had to improvise with L splint during last visit, Diannia Ruder has ordered them).    Consulted and Agree with Plan of Care Patient;Family member/caregiver   Family Member Consulted brother, Aywood        Problem List Patient  Active Problem List   Diagnosis Date Noted  . OSA (obstructive sleep apnea) 10/11/2014  . Excessive daytime sleepiness 10/11/2014  . Hemiparesis (HCC) 10/11/2014  . Dysarthria 10/11/2014  . Pneumoperitoneum 05/01/2013  . Lower GI bleed 05/01/2013  . UTI (urinary tract infection) 05/01/2013  . Supratherapeutic INR 05/01/2013  . Chronic indwelling Foley catheter 05/01/2013  . Jejunostomy tube present (?originally G tube?) 05/01/2013  . Laceration of eyebrow s/p repair 04/26/2013 05/01/2013  . 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 Hemiparesis affecting right side as late effect of stroke (HCC) 05/01/2013  . 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 DVT of right axillary vein, acute 05/01/2013  . CVA (cerebral vascular accident) (HCC) 05/01/2013  . Anemia 05/01/2013  . Dysphagia     Harriet Butte, PT, MPT [facility] [address] [place], [place], [address] Phone: [phone]   Fax:  [phone] 07/02/2015, 12:50 PM  Name: [name] [name] MRN: [id number] Date of Birth: February 12, 1956

## 2015-07-03 ENCOUNTER — Ambulatory Visit: Payer: BLUE CROSS/BLUE SHIELD | Admitting: Rehabilitation

## 2015-07-03 DIAGNOSIS — R269 Unspecified abnormalities of gait and mobility: Secondary | ICD-10-CM | POA: Diagnosis not present

## 2015-07-03 DIAGNOSIS — M62838 Other muscle spasm: Secondary | ICD-10-CM

## 2015-07-03 DIAGNOSIS — IMO0002 Reserved for concepts with insufficient information to code with codable children: Secondary | ICD-10-CM

## 2015-07-03 DIAGNOSIS — R2681 Unsteadiness on feet: Secondary | ICD-10-CM

## 2015-07-03 NOTE — Therapy (Signed)
[facility] [address] [place], [place], [address] Phone: [phone]   Fax:  [phone]  Physical Therapy Treatment  Patient Details  Name: [name] [name] MRN: [id number] Date of Birth: 1956-02-11 Referring Provider: Naomie Dean, MD  Encounter Date: 07/03/2015      PT End of Session - 07/03/15 0810    Visit Number 8   Number of Visits 17   Date for PT Re-Evaluation 08/04/15   Authorization Type [employer]; 20 visit limit for PT   Authorization - Visit Number 8   Authorization - Number of Visits 20   PT Start Time 0800   PT Stop Time 0845   PT Time Calculation (min) 45 min   Activity Tolerance Patient tolerated treatment well;Treatment limited secondary to medical complications (Comment)  Limited by internal distraction   Behavior During Therapy Restless      Past Medical History  Diagnosis Date  . Dysphagia   . DVT (deep venous thrombosis) (HCC)   . Hypertension   . Depression   . 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 Enlarged prostate     Past Surgical History  Procedure Laterality Date  . Esophagoscopy w/ percutaneous gastrostomy tube placement  04/12/2013    Dr Murrell Converse, [facility]    There were no vitals filed for this visit.  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 Pt's brother's goals: to improve safety awareness, to increase pt safety going  up stairs with cane.   Currently in Pain? No/denies              NMR:  Continue to work on tall kneeling in order to address postural control with increased weight shift and WB through RLE and UE during reaching task to the R.  Provided facilitation at RUE to maintain elbow extension and hand placement.  Gait training with use of RW (x 350') in order to address more midline posture with PT assisting for RUE stability.  Pt continues to have increased lateral weight shifts, causing RW to tip occasionally, however PT provided facilitation in downward motion to increase WB through RW with RUE.  Also provided tactile cues at R shoulder for increased shoulder depression as he tends to keep R shoulder elevated during gait.  Progressed to ambulation x 115' without AD, again to provide facilitation and feedback for increased midline posture, decreased R shoulder elevation, increased weight shift to the R and increased step length on LLE to increase time spent on RLE.  Ended session with stepping LLE up to 6" step with and without UE support x 10 reps each with pt providing facilitation for increased R lateral weight shift with cues for slower stepping to increase time spent on RLE.  Continue to educate pt on possible progress but the reality that he may not regain full function of RUE/LE.  Also discussed pros/cons of possible botox injection to R quad muscle.  Pt and brother verbalized understanding.                    PT Education - 07/03/15 0809    Education provided Yes   Education Details education on possible botox injection in RLE   Person(s) Educated Patient;Other (comment)  brother   Methods Explanation   Comprehension Verbalized understanding          PT Short Term Goals - 06/11/15 0924    PT SHORT TERM GOAL #1   Title Pt will perform initial HEP with mod I using paper handout to maximize functional gains made in PT. Target date:07/03/15   PT SHORT TERM GOAL #2   Title  Complete Berg and improve score by 5 points from baseline to indicate improved functional standing balance. Target date:07/03/15   Baseline 10/31: baseline Berg score = 42/56   PT SHORT TERM GOAL #3   Title Pt will decrease TUG from 25.10 sec to < 20 sec to indicate increased efficiency of mobility. Target date:07/03/15   PT SHORT TERM GOAL #4   Title Pt will increase gait velocity from 1.23 ft/sec to 1.53 ft/sec to indicate increased efficiency of ambulation. Target date:07/03/15   PT SHORT TERM GOAL #5   Title Pt will negotiate 4 stairs with 2 rails (either side to simulate home entrance) while safely managing SPC to indicate increased safety using primary entrance of home.  Target date:07/03/15   PT SHORT TERM GOAL #6   Title Pt will ambulate 200' over level, indoor surfaces with mod I with proper use of LRAD to indicate increased safety with household mobility, safe use of AD. Target date:07/03/15           PT Long Term Goals - 06/11/15 0925    PT LONG TERM GOAL #1   Title Pt will verbalize understanding of CVA warning signs, pertinent risk factors to prevent future CVA. Target date: 07/31/15   PT LONG TERM GOAL #2   Title Pt will verbalize understanding of fall prevention strategies to decrease fall risk in home environment. Target date: 07/31/15   PT LONG TERM GOAL #3   Title Pt will negotiate standard ramp and curb step with mod I using LRAD to increase pt safety/independence traversing community obstacles. Target date: 07/31/15   PT LONG TERM GOAL #4   Title Pt will ambulate 500' over unlevel, paved surfaces with mod I using LRAD to indicate increased safety with limited community mobility. Target date: 07/31/15   PT LONG TERM GOAL #5   Title Pt will increase gait velocity from 1.23 ft/sec to > / = 1.83 ft/sec to indicate decreased risk of recurrent falls. Target date: 07/31/15   PT LONG TERM GOAL #6   Title Pt will improve Berg score by 8 points from baseline to indicate  significant improvement in functional standing balance. Target date: 07/31/15   Baseline 10/31: baseline Berg score = 42/56   PT LONG TERM GOAL #7   Title Pt will decrease TUG time from 25.10 sec to < 15 seconds to indicate decreased fall risk. Target date: 07/31/15               Plan - 07/03/15 1610    Clinical Impression Statement Skilled session continues to focus on NMR for RUE/LE to allowing increased WB, weight shift to the R, and increased RLE extensor tone.  Also focusing on gait for more midline posture with increased RLE weight bearing and decreased trunk compensations.    Pt  will benefit from skilled therapeutic intervention in order to improve on the following deficits Abnormal gait;Decreased cognition;Decreased knowledge of use of DME;Decreased coordination;Decreased mobility;Decreased endurance;Decreased activity tolerance;Decreased balance;Decreased range of motion;Decreased safety awareness;Impaired UE functional use;Decreased strength;Impaired tone;Impaired flexibility   Rehab Potential Good   Clinical Impairments Affecting Rehab Potential Transportation limitations (pt does not drive; brother works FT)   PT Frequency 2x / week   PT Duration 8 weeks   PT Treatment/Interventions DME Instruction;Electrical Stimulation;Gait training;Stair training;Functional mobility training;Therapeutic activities;Therapeutic exercise;Balance training;Patient/family education;Orthotic Fit/Training;Neuromuscular re-education;Manual techniques;Vestibular   PT Next Visit Plan Check STG's (sorry I forgot) Continue to trial RW with R hand orthosis (had to improvise with L splint during last visit, Diannia RuderKara has ordered them).    Consulted and Agree with Plan of Care Patient;Family member/caregiver   Family Member Consulted brother, Aywood        Problem List Patient Active Problem List   Diagnosis Date Noted  . OSA (obstructive sleep apnea) 10/11/2014  . Excessive daytime sleepiness 10/11/2014   . Hemiparesis (HCC) 10/11/2014  . Dysarthria 10/11/2014  . Pneumoperitoneum 05/01/2013  . Lower GI bleed 05/01/2013  . UTI (urinary tract infection) 05/01/2013  . Supratherapeutic INR 05/01/2013  . Chronic indwelling Foley catheter 05/01/2013  . Jejunostomy tube present (?originally G tube?) 05/01/2013  . Laceration of eyebrow s/p repair 04/26/2013 05/01/2013  . 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 Hemiparesis affecting right side as late effect of stroke (HCC) 05/01/2013  . 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 DVT of right axillary vein, acute 05/01/2013  . CVA (cerebral vascular accident) (HCC) 05/01/2013  . Anemia 05/01/2013  . Dysphagia    Harriet ButteEmily [place], PT, MPT [facility] [address] [place], [place], [address] Phone: [phone]   Fax:  [phone] 07/03/2015, 8:57 AM  Name: Adella NissenRobert Camera MRN: [id number] Date of Birth: 1955-08-16

## 2015-07-09 ENCOUNTER — Telehealth: Payer: Self-pay | Admitting: Rehabilitation

## 2015-07-09 ENCOUNTER — Ambulatory Visit: Payer: BLUE CROSS/BLUE SHIELD | Admitting: Rehabilitation

## 2015-07-09 ENCOUNTER — Encounter: Payer: Self-pay | Admitting: Rehabilitation

## 2015-07-09 DIAGNOSIS — M62838 Other muscle spasm: Secondary | ICD-10-CM

## 2015-07-09 DIAGNOSIS — R269 Unspecified abnormalities of gait and mobility: Secondary | ICD-10-CM

## 2015-07-09 DIAGNOSIS — IMO0002 Reserved for concepts with insufficient information to code with codable children: Secondary | ICD-10-CM

## 2015-07-09 DIAGNOSIS — R2681 Unsteadiness on feet: Secondary | ICD-10-CM

## 2015-07-09 NOTE — Telephone Encounter (Signed)
LVM for pt to call back to set up appt w/ Dr Terrace ArabiaYan. Made appt for 12/1 @ 10:30am, check in 1000am. Ask pt if this works. Called home/temporary and tried work but it was not available.

## 2015-07-09 NOTE — Telephone Encounter (Signed)
Kara MeadEmma - I am transitioning all my botox spasticity patients to Dr. Terrace ArabiaYan. Would you set up a 30 minute consultation with Dr. Terrace Arabiayan to address his current right arm botox therapy and evaluate for additional botox in the right quad per PT and OT recommendations? Dr. Terrace Arabiayan has approved the transition of my spasticity and cervical dystonia patients. Thank you. Thank you.

## 2015-07-09 NOTE — Therapy (Signed)
[facility] [address] [place], [place], [address] Phone: [phone]   Fax:  [phone]  Physical Therapy Treatment  Patient Details  Name: [name] [name] MRN: [id number] Date of Birth: 07-02-1956 Referring Provider: Sarina Ill, MD  Encounter Date: 07/09/2015      PT End of Session - 07/09/15 0803    Visit Number 9   Number of Visits 17   Date for PT Re-Evaluation 08/04/15   Authorization Type BC[employer]; 20 visit limit for PT   Authorization - Visit Number 9   Authorization - Number of Visits 20   PT Start Time 0800   PT Stop Time 0845   PT Time Calculation (min) 45 min   Activity Tolerance Patient tolerated treatment well;Treatment limited secondary to medical complications (Comment)  Limited by internal distraction   Behavior During Therapy Restless      Past Medical History  Diagnosis Date  . Dysphagia   . DVT (deep venous thrombosis) ([place])   . Hypertension   . Depression   . CVA (cerebral vascular accident) ([place]) 05/01/2013    left pontine infarct  . 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 Enlarged prostate     Past Surgical History  Procedure Laterality Date  . Esophagoscopy w/ percutaneous gastrostomy tube placement  04/12/2013    Dr Rinaldo Cloud, [facility]    There were no vitals filed for this visit.  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 Pt's brother's goals: to improve safety awareness, to increase pt safety going up stairs with cane.   Currently in Pain?  No/denies   Pain Score 3    Pain Location Hand  L thumb   Pain Orientation Left   Pain Descriptors / Indicators Dull   Pain Type Acute pain   Pain Onset More than a month ago   Aggravating Factors  overuse   Pain Relieving Factors rest             Therex:  Performed supine R quad stretch x 2 reps of 60 secs in order to decrease tone in R quads prior to upright mobility.   Progressed to addressing remainder of HEP.  Pt requires cues for technique and safety during HEP as well as cues for posture throughout.  Placed mirror in front of pt for visual feedback on posture during forward/retro stepping.  Pt with marked improvement in weight shift to the R, again, demonstrates very little carryover to gait.    NMR: Progressed to gait without AD in order to continue to address midline posture to decrease trunk rotation and increase weight shift and WB to the R.  Provided assist at R shoulder to increase depression as he continues to leave R shoulder elevated during gait as well as at pelvis to provide increased WB with cues for slower R step.     Self Care: Pt with continued questions regarding recovery, splint usage and when splint will begin to "work."  Provided education per OT notes on instructions, feel he will need continued instruction for  carryover.  Pt continues to perseverate on being able to perform stretch at home on his own, however continue to educate that for safety and due to increased tone, he will need assist to perform stretch adequately.  Also continue to discuss possible botox injection and that PT would in-basket MD today regarding request for appt.   Gait: Then assessed carryover of NMR with use of SBQC to address STG.  Note very little carryover and pt continues to lean to the L as well as place quad cane tilted and requires cues for proper placement of cane throughout.  Also addressed stairs.  Pt able to complete safely with use of B handrails, however still requires cues  for safe management of cane during stair negotiation.                     PT Education - 07/09/15 0803    Education provided Yes   Education Details Education on sending note to MD regarding Botox injection today.  Addition to HEP   Person(s) Educated Patient   Methods Explanation;Handout   Comprehension Verbalized understanding          PT Short Term Goals - 07/09/15 1026    PT SHORT TERM GOAL #1   Title Pt will perform initial HEP with mod I using paper handout to maximize functional gains made in PT. Target date:07/03/15   Baseline Needs min cues throughout to complete HEP.    Status Partially Met   PT SHORT TERM GOAL #2   Title Complete Berg and improve score by 5 points from baseline to indicate improved functional standing balance. Target date:07/03/15   Baseline 10/31: baseline Berg score = 42/56   Status On-going   PT SHORT TERM GOAL #3   Title Pt will decrease TUG from 25.10 sec to < 20 sec to indicate increased efficiency of mobility. Target date:07/03/15   Status On-going   PT SHORT TERM GOAL #4   Title Pt will increase gait velocity from 1.23 ft/sec to 1.53 ft/sec to indicate increased efficiency of ambulation. Target date:07/03/15   Status On-going   PT SHORT TERM GOAL #5   Title Pt will negotiate 4 stairs with 2 rails (either side to simulate home entrance) while safely managing SPC to indicate increased safety using primary entrance of home.  Target date:07/03/15   Baseline Needs continued cues for safety managing cane, otherwise does well with two handrails.    Status Partially Met   PT SHORT TERM GOAL #6   Title Pt will ambulate 200' over level, indoor surfaces with mod I with proper use of LRAD to indicate increased safety with household mobility, safe use of AD. Target date:07/03/15   Baseline Intermittent min cues for placing quad cane in complete contact with ground as he continues to keep it tilted.    Status Partially Met           PT  Long Term Goals - 06/11/15 0925    PT LONG TERM GOAL #1   Title Pt will verbalize understanding of CVA warning signs, pertinent risk factors to prevent future CVA. Target date: 07/31/15   PT LONG TERM GOAL #2   Title Pt will verbalize understanding of fall prevention strategies to decrease fall risk in home environment. Target date: 07/31/15   PT LONG TERM GOAL #3   Title Pt will negotiate standard ramp and curb step with mod I using LRAD to increase pt safety/independence traversing community obstacles. Target date: 07/31/15  PT LONG TERM GOAL #4   Title Pt will ambulate 500' over unlevel, paved surfaces with mod I using LRAD to indicate increased safety with limited community mobility. Target date: 07/31/15   PT LONG TERM GOAL #5   Title Pt will increase gait velocity from 1.23 ft/sec to > / = 1.83 ft/sec to indicate decreased risk of recurrent falls. Target date: 07/31/15   PT LONG TERM GOAL #6   Title Pt will improve Berg score by 8 points from baseline to indicate significant improvement in functional standing balance. Target date: 07/31/15   Baseline 10/31: baseline Berg score = 42/56   PT LONG TERM GOAL #7   Title Pt will decrease TUG time from 25.10 sec to < 15 seconds to indicate decreased fall risk. Target date: 07/31/15               Plan - 07/09/15 0804    Clinical Impression Statement Session focused on checking STG's.  Pt mas partially met 3/6 goals during session.  He continues to require cues for HEP, stair negotiation and proper use of SBQC.  Continue to feel that he may be safer and more efficient with gait with use of RW, however hand splint still not arrived, therefore will continue to assess before use at home.  Did not get to check remaining STG's due to time constraint and pts decreased attention during session.   Will continue to address in future sessions.    Pt will benefit from skilled therapeutic intervention in order to improve on the following deficits  Abnormal gait;Decreased cognition;Decreased knowledge of use of DME;Decreased coordination;Decreased mobility;Decreased endurance;Decreased activity tolerance;Decreased balance;Decreased range of motion;Decreased safety awareness;Impaired UE functional use;Decreased strength;Impaired tone;Impaired flexibility   Rehab Potential Good   Clinical Impairments Affecting Rehab Potential Transportation limitations (pt does not drive; brother works FT)   PT Frequency 2x / week   PT Duration 8 weeks   PT Treatment/Interventions DME Instruction;Electrical Stimulation;Gait training;Stair training;Functional mobility training;Therapeutic activities;Therapeutic exercise;Balance training;Patient/family education;Orthotic Fit/Training;Neuromuscular re-education;Manual techniques;Vestibular   PT Next Visit Plan Check remaining STG's.  Continue to trial RW with R hand orthosis (had to improvise with L splint during last visit, Marcene Brawn has ordered them, we now have).    PT Home Exercise Plan see pt instruction   Consulted and Agree with Plan of Care Patient   Family Member Consulted --        Problem List Patient Active Problem List   Diagnosis Date Noted  . OSA (obstructive sleep apnea) 10/11/2014  . Excessive daytime sleepiness 10/11/2014  . Hemiparesis ([place]) 10/11/2014  . Dysarthria 10/11/2014  . Pneumoperitoneum 05/01/2013  . Lower GI bleed 05/01/2013  . UTI (urinary tract infection) 05/01/2013  . Supratherapeutic INR 05/01/2013  . Chronic indwelling Foley catheter 05/01/2013  . Jejunostomy tube present (?originally G tube?) 05/01/2013  . Laceration of eyebrow s/p repair 04/26/2013 05/01/2013  . 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 Hemiparesis affecting right side as late effect of stroke ([place]) 05/01/2013  . 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 DVT of right axillary vein, acute 05/01/2013  . CVA (cerebral vascular accident) ([place]) 05/01/2013  . Anemia 05/01/2013  .  Dysphagia     Cameron Sprang, PT, MPT [facility] [address] [place], [place], [address] Phone: [phone]   Fax:  [phone] 07/09/2015, 10:32 AM  Name: [name] [name] MRN: [id number] Date of Birth: May 10, 1956

## 2015-07-09 NOTE — Patient Instructions (Signed)
Weight Shift: Stepping Forward and Back    Stand beside countertop with L hand on counter for support.  Weight on right leg, step left leg forwards and back.  Repeat 10___ times.  Make sure that you are staying tall and keep hand on counter for support.    Copyright  VHI. All rights reserved.

## 2015-07-09 NOTE — Telephone Encounter (Signed)
Spoke to patient - he is going to arrange transportation with his brother for his appt.  If for some reason his brother is unavailable, he will call back to change the time.  He is aware of the office cancellation policy and will call back tomorrow if a change is necessary.

## 2015-07-09 NOTE — Telephone Encounter (Signed)
Dr. Lucia GaskinsAhern,   I have been seeing Mr. Adella Nissenobert Jaroszewski at [facility] and feel that he may benefit from Botox injection at R quads to decrease extensor tone during gait to make more efficient and safe.  It also seems that he would greatly benefit from botox in the R arm as well, however his primary OT not here today to confirm.  Pt otherwise making very little gains during therapy due to poor carryover and poor cognition.  Have discussed possible benefits of botox with pt and brother, but wanted to get your thoughts as well.    Thanks,  Harriet ButteEmily Agripina Guyette, PT, MPT [facility] [address] [place], [place], [address] Phone: [phone]   Fax:  [phone] 07/09/2015, 11:04 AM

## 2015-07-11 ENCOUNTER — Ambulatory Visit: Payer: BLUE CROSS/BLUE SHIELD | Admitting: Occupational Therapy

## 2015-07-11 ENCOUNTER — Encounter: Payer: BLUE CROSS/BLUE SHIELD | Admitting: Occupational Therapy

## 2015-07-12 ENCOUNTER — Ambulatory Visit: Payer: Self-pay | Admitting: Neurology

## 2015-07-13 ENCOUNTER — Ambulatory Visit: Payer: BLUE CROSS/BLUE SHIELD | Attending: Neurology

## 2015-07-13 ENCOUNTER — Ambulatory Visit: Payer: BLUE CROSS/BLUE SHIELD | Admitting: Occupational Therapy

## 2015-07-13 DIAGNOSIS — I69319 Unspecified symptoms and signs involving cognitive functions following cerebral infarction: Secondary | ICD-10-CM | POA: Insufficient documentation

## 2015-07-13 DIAGNOSIS — R269 Unspecified abnormalities of gait and mobility: Secondary | ICD-10-CM | POA: Insufficient documentation

## 2015-07-13 DIAGNOSIS — R2681 Unsteadiness on feet: Secondary | ICD-10-CM | POA: Diagnosis present

## 2015-07-13 DIAGNOSIS — IMO0002 Reserved for concepts with insufficient information to code with codable children: Secondary | ICD-10-CM

## 2015-07-13 DIAGNOSIS — R252 Cramp and spasm: Secondary | ICD-10-CM

## 2015-07-13 DIAGNOSIS — I69359 Hemiplegia and hemiparesis following cerebral infarction affecting unspecified side: Secondary | ICD-10-CM | POA: Insufficient documentation

## 2015-07-13 DIAGNOSIS — R258 Other abnormal involuntary movements: Secondary | ICD-10-CM | POA: Diagnosis present

## 2015-07-13 DIAGNOSIS — I69898 Other sequelae of other cerebrovascular disease: Secondary | ICD-10-CM | POA: Diagnosis present

## 2015-07-13 DIAGNOSIS — R531 Weakness: Secondary | ICD-10-CM | POA: Insufficient documentation

## 2015-07-13 DIAGNOSIS — R279 Unspecified lack of coordination: Secondary | ICD-10-CM | POA: Insufficient documentation

## 2015-07-13 DIAGNOSIS — R278 Other lack of coordination: Secondary | ICD-10-CM

## 2015-07-13 DIAGNOSIS — M6249 Contracture of muscle, multiple sites: Secondary | ICD-10-CM | POA: Diagnosis present

## 2015-07-13 NOTE — Therapy (Signed)
[facility] [address] [place], [place], [address] Phone: [phone]   Fax:  [phone]  Physical Therapy Treatment  Patient Details  Name: [name] [name] MRN: [id number] Date of Birth: [birth year] Referring Provider: Sarina Ill, MD  Encounter Date: 07/13/2015      PT End of Session - 07/13/15 1306    Visit Number 10   Number of Visits 17   Date for PT Re-Evaluation 08/04/15   Authorization Type [employer]; 20 visit limit for PT   Authorization - Visit Number 10   Authorization - Number of Visits 20   PT Start Time 1149  with OT   PT Stop Time 4  pt's brother had to leave early to get to work   PT Time Calculation (min) 35 min   Equipment Utilized During Treatment Gait belt   Activity Tolerance Patient tolerated treatment well   Behavior During Therapy --  cues to attend to task      Past Medical History  Diagnosis Date  . Dysphagia   . DVT (deep venous thrombosis) ([place])   . Hypertension   . Depression   . CVA (cerebral vascular accident) ([place]) 05/01/2013    left pontine infarct  . 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 Enlarged prostate     Past Surgical History  Procedure Laterality Date  . Esophagoscopy w/ percutaneous gastrostomy tube placement  04/12/2013    Dr Rinaldo Cloud, [facility]    There were no vitals filed for this visit.  Visit Diagnosis:  Abnormality of gait  Unsteadiness      Subjective Assessment - 07/13/15 1152    Subjective Pt denied falls or changes since last visit.    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 Pt's brother's goals: to improve safety awareness, to increase pt safety going  up stairs with cane.   Currently in Pain? No/denies                         [facility] Adult PT Treatment/Exercise - 07/13/15 1204    Ambulation/Gait   Ambulation/Gait Yes   Ambulation/Gait Assistance 5: Supervision   Ambulation/Gait Assistance Details Cues for sequencing, improve upright posture, improve stride length, and R heel strike when using SBQC.    Ambulation Distance (Feet) 225 Feet   Assistive device Small based quad cane;None   Gait Pattern Step-through pattern;Decreased arm swing - right;Decreased step length - right;Decreased hip/knee flexion - right;Decreased stance time - right;Decreased weight shift to right;Abducted- right;Trunk rotated posteriorly on right;Right foot flat;Decreased dorsiflexion - right;Trunk flexed   Ambulation Surface Level;Indoor   Gait [facility]sec.  [facility]  Standardized Balance Assessment   Standardized Balance Assessment Berg Balance Test;Timed Up and Go Test   Berg Balance Test   Sit to Stand Able to stand without using hands and stabilize independently   Standing Unsupported Able to stand safely 2 minutes   Sitting with Back Unsupported but Feet Supported on Floor or Stool Able to sit safely and securely 2 minutes   Stand to Sit Sits safely with minimal use of hands   Transfers Able to transfer safely, minor use of hands   Standing Unsupported  with Eyes Closed Able to stand 10 seconds safely   Standing Ubsupported with Feet Together Able to place feet together independently and stand 1 minute safely   From Standing, Reach Forward with Outstretched Arm Can reach confidently >25 cm (10")   From Standing Position, Pick up Object from [place] to pick up shoe safely and easily   From Standing Position, Turn to Look Behind Over each Shoulder Looks behind one side only/other side shows less weight shift   Turn 360 Degrees Needs close supervision or verbal cueing   Standing Unsupported, Alternately Place Feet on Step/Stool Able  to complete 4 steps without aid or supervision   Standing Unsupported, One Foot in [place] to plae foot ahead of the other independently and hold 30 seconds   Standing on One Leg Tries to lift leg/unable to hold 3 seconds but remains standing independently  R LE: 1-2 seconds and L LE: 3 seconds   Total Score 46   Timed Up and Go Test   TUG Normal TUG   Normal TUG (seconds) 23  without SBQC and 34.3 with SBQC                PT Education - 07/13/15 1306    Education provided Yes   Education Details PT reiterated the importance of using SBQC at all times, based on falls risk (TUG, BERG and gait speed).   Person(s) Educated Patient;Other (comment)  brother-Carl   Methods Explanation   Comprehension Verbalized understanding;Need further instruction          PT Short Term Goals - 07/13/15 1309    PT SHORT TERM GOAL #1   Title Pt will perform initial HEP with mod I using paper handout to maximize functional gains made in PT. Target date:07/03/15   Baseline Needs min cues throughout to complete HEP.    Status Partially Met   PT SHORT TERM GOAL #2   Title Complete Berg and improve score by 5 points from baseline to indicate improved functional standing balance. Target date:07/03/15   Baseline 10/31: baseline Berg score = 42/56, 12/2: 46/56   Status Partially Met   PT SHORT TERM GOAL #3   Title Pt will decrease TUG from 25.10 sec to < 20 sec to indicate increased efficiency of mobility. Target date:07/03/15   Status Partially Met   PT SHORT TERM GOAL #4   Title Pt will increase gait velocity from 1.23 ft/sec to 1.53 ft/sec to indicate increased efficiency of ambulation. Target date:07/03/15   Status Partially Met   PT SHORT TERM GOAL #5   Title Pt will negotiate 4 stairs with 2 rails (either side to simulate home entrance) while safely managing SPC to indicate increased safety using primary entrance of home.  Target date:07/03/15   Baseline Needs continued cues for safety  managing cane, otherwise does well with two handrails.    Status Partially Met   PT SHORT TERM GOAL #6   Title Pt will ambulate 200' over level, indoor surfaces with mod I with proper use of LRAD to indicate increased safety with household mobility, safe use of AD. Target date:07/03/15   Baseline Intermittent min cues for placing quad cane in complete contact with ground as he continues to keep it tilted.    Status Partially Met           PT Long Term Goals - 06/11/15 0925    PT LONG TERM GOAL #1   Title Pt will verbalize understanding of CVA warning signs,  pertinent risk factors to prevent future CVA. Target date: 07/31/15   PT LONG TERM GOAL #2   Title Pt will verbalize understanding of fall prevention strategies to decrease fall risk in home environment. Target date: 07/31/15   PT LONG TERM GOAL #3   Title Pt will negotiate standard ramp and curb step with mod I using LRAD to increase pt safety/independence traversing community obstacles. Target date: 07/31/15   PT LONG TERM GOAL #4   Title Pt will ambulate 500' over unlevel, paved surfaces with mod I using LRAD to indicate increased safety with limited community mobility. Target date: 07/31/15   PT LONG TERM GOAL #5   Title Pt will increase gait velocity from 1.23 ft/sec to > / = 1.83 ft/sec to indicate decreased risk of recurrent falls. Target date: 07/31/15   PT LONG TERM GOAL #6   Title Pt will improve Berg score by 8 points from baseline to indicate significant improvement in functional standing balance. Target date: 07/31/15   Baseline 10/31: baseline Berg score = 42/56   PT LONG TERM GOAL #7   Title Pt will decrease TUG time from 25.10 sec to < 15 seconds to indicate decreased fall risk. Target date: 07/31/15               Plan - 07/13/15 1307    Clinical Impression Statement This session focused on checking remaining STGs. Pt demonstrated progress, as he partially met remaining 3 STGs. Pt required cues to attend to  task, as he was easily distracted during session and perserverated on R hand impairments after OT session. Pt's BERG score indicates he progressed from a high falls risk to a moderate falls risk.  Pt would continue to benefit from skilled PT to improve safety during functional mobility.   Pt will benefit from skilled therapeutic intervention in order to improve on the following deficits Abnormal gait;Decreased cognition;Decreased knowledge of use of DME;Decreased coordination;Decreased mobility;Decreased endurance;Decreased activity tolerance;Decreased balance;Decreased range of motion;Decreased safety awareness;Impaired UE functional use;Decreased strength;Impaired tone;Impaired flexibility   Rehab Potential Good   Clinical Impairments Affecting Rehab Potential Transportation limitations (pt does not drive; brother works FT)   PT Frequency 2x / week   PT Duration 8 weeks   PT Treatment/Interventions DME Instruction;Electrical Stimulation;Gait training;Stair training;Functional mobility training;Therapeutic activities;Therapeutic exercise;Balance training;Patient/family education;Orthotic Fit/Training;Neuromuscular re-education;Manual techniques;Vestibular   PT Next Visit Plan  trial RW with R hand orthosis (had to improvise with L splint during last visit, Marcene Brawn has ordered them, we now have).    PT Home Exercise Plan see pt instruction   Consulted and Agree with Plan of Care Patient   Family Member Consulted brother, Glendell Docker        Problem List Patient Active Problem List   Diagnosis Date Noted  . OSA (obstructive sleep apnea) 10/11/2014  . Excessive daytime sleepiness 10/11/2014  . Hemiparesis ([place]) 10/11/2014  . Dysarthria 10/11/2014  . Pneumoperitoneum 05/01/2013  . Lower GI bleed 05/01/2013  . UTI (urinary tract infection) 05/01/2013  . Supratherapeutic INR 05/01/2013  . Chronic indwelling Foley catheter 05/01/2013  . Jejunostomy tube present (?originally G tube?) 05/01/2013  .  Laceration of eyebrow s/p repair 04/26/2013 05/01/2013  . 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 Hemiparesis affecting right side as late effect of stroke ([place]) 05/01/2013  . 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 DVT of right axillary vein, acute 05/01/2013  . CVA (cerebral vascular accident) ([place]) 05/01/2013  . Anemia 05/01/2013  . Dysphagia  Miller,Jennifer L 07/13/2015, 1:11 PM  [facility] [address] [place], [place], [address] Phone: [phone]   Fax:  [phone]  Name: Jonte Shiller MRN: [id number] Date of Birth: 1955/12/01    Geoffry Paradise, PT,DPT 07/13/2015 1:11 PM Phone: [phone] Fax: [phone]

## 2015-07-13 NOTE — Therapy (Signed)
[facility] [address] [place], [place], [address] Phone: [phone]   Fax:  [phone]  Occupational Therapy Treatment  Patient Details  Name: [name] [name] MRN: [id number] Date of Birth: July 31, 1956 Referring Provider: Dr. Lucia Gaskins  Encounter Date: 07/13/2015      OT End of Session - 07/13/15 1729    Visit Number 7   Number of Visits 17   Date for OT Re-Evaluation 08/03/15   Authorization Type BCBS   Authorization Time Period 20 visit limit   Authorization - Visit Number 7   Authorization - Number of Visits 20   OT Start Time 1104   OT Stop Time 1145   OT Time Calculation (min) 41 min   Activity Tolerance Patient tolerated treatment well   Behavior During Therapy [facility] for tasks assessed/performed      Past Medical History  Diagnosis Date  . Dysphagia   . DVT (deep venous thrombosis) (HCC)   . Hypertension   . Depression   . 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 Enlarged prostate     Past Surgical History  Procedure Laterality Date  . Esophagoscopy w/ percutaneous gastrostomy tube placement  04/12/2013    Dr Murrell Converse, [facility]    There were no vitals filed for this visit.  Visit Diagnosis:  Weakness due to cerebrovascular accident  Cognitive deficits following cerebral infarction  Hemiparesis affecting dominant side as late effect of cerebrovascular accident (HCC)  Decreased coordination  Spasticity      Subjective Assessment - 07/13/15 1139    Subjective  left thumb pain   Pertinent History see epic    Patient Stated Goals to use arm again, write name   Currently in Pain? Yes   Pain Score 2    Pain Orientation Left   Pain Descriptors / Indicators Aching   Pain Type Chronic pain   Pain Onset More than a month ago   Pain Frequency Intermittent   Aggravating Factors  overuse   Pain Relieving Factors rest   Multiple Pain Sites No       Treatment: Estim(50 pps, 250 pw, 10 secs cycle, 17 intensity) x 10 mins to finger and wrist extensors after performing P/ROM supination/ wrist and finger extension.  Therapist  facilitated or cued pt for IP extension during estim, and blocked MP's to prevent clawing. Pt was instructed that he can wear resting hand splint at night now as he tolerates for 4 hrs during day.Functional grasp/ release of cylindrical objects with min-mod v.c./ facilitation.                         OT Short Term Goals - 06/06/15 1142    OT SHORT TERM GOAL #1   Title I with HEP.   Time 4   Period Weeks   Status New   OT SHORT TERM GOAL #2   Title Pt will verbalize understanding of RUE positioning to minimize risk for injury, pain  and contracture.   Time 4   Period Weeks   Status New   OT SHORT TERM GOAL #3   Title Pt will demonstrate A/ROM shoulder flexion of 60* in prep for functional reach.   Time 4   Period Weeks   Status New   OT SHORT TERM GOAL #4   Title Pt will demonstrate  ability to write his name with 70% legibility   Time 4   Period Weeks   Status New           OT Long Term Goals - 06/06/15 1143    OT LONG TERM GOAL #1   Title Pt will demonstrate ability to perform light home management / cooking modified independently demonstrating good safety awareness.   Time 8   Period Weeks   Status New   OT LONG TERM GOAL #2   Title Pt will report ability to use RUE as an active assist at least 50% of the time for light home management/ ADLs.   Time 8   Period Weeks   Status New   OT LONG TERM GOAL #3   Title Pt will demonstrate improved RUE functional use as evidenced by performing 10 blocks on box/ blocks with RUE.   Time 8   Period Weeks   Status New   OT LONG TERM GOAL #4   Title Pt will demonstrate ability to write his name with 90% legibility   Time 8   Period Weeks    Status New               Plan - 07/13/15 1728    Clinical Impression Statement Pt is progessing towards goals for improving functional use of RUE.   Pt will benefit from skilled therapeutic intervention in order to improve on the following deficits (Retired) Abnormal gait;Decreased coordination;Decreased range of motion;Difficulty walking;Impaired flexibility;Decreased endurance;Decreased safety awareness;Increased edema;Impaired sensation;Obesity;Decreased balance;Decreased knowledge of use of DME;Impaired UE functional use;Pain;Impaired perceived functional ability;Decreased strength;Decreased mobility;Decreased cognition;Impaired tone;Decreased activity tolerance;Decreased knowledge of precautions   Rehab Potential Good   Clinical Impairments Affecting Rehab Potential spasticity   OT Frequency 2x / week   OT Duration 8 weeks   OT Treatment/Interventions Self-care/ADL training;Therapeutic exercise;Patient/family education;Balance training;Splinting;Manual Therapy;Neuromuscular education;Ultrasound;Iontophoresis;Therapeutic exercises;Therapeutic activities;DME and/or AE instruction;Parrafin;Cryotherapy;Electrical Stimulation;Fluidtherapy;Gait Training;Cognitive remediation/compensation;Visual/perceptual remediation/compensation;Passive range of motion;Contrast Bath;Moist Heat   Plan , NMR, estim   Consulted and Agree with Plan of Care Patient   Family Member Consulted brother        Problem List Patient Active Problem List   Diagnosis Date Noted  . OSA (obstructive sleep apnea) 10/11/2014  . Excessive daytime sleepiness 10/11/2014  . Hemiparesis (HCC) 10/11/2014  . Dysarthria 10/11/2014  . Pneumoperitoneum 05/01/2013  . Lower GI bleed 05/01/2013  . UTI (urinary tract infection) 05/01/2013  . Supratherapeutic INR 05/01/2013  . Chronic indwelling Foley catheter 05/01/2013  . Jejunostomy tube present (?originally G tube?) 05/01/2013  . Laceration of eyebrow s/p repair 04/26/2013  05/01/2013  . 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 Hemiparesis affecting right side as late effect of stroke (HCC) 05/01/2013  . 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 DVT of right axillary vein, acute 05/01/2013  . CVA (cerebral vascular accident) (HCC) 05/01/2013  . Anemia 05/01/2013  . Dysphagia     Henessy Rohrer 07/13/2015, 5:36 PM Keene BreathKathryn Felix Meras, OTR/L [phone] Phone: [phone] 5:36 PM 07/13/2015  [facility] [address] [place], [place], [address] Phone: [phone]   Fax:  [phone]  Name: [name] NissenRobert [name] MRN: [id number] Date of Birth: Jul 15, 1956

## 2015-07-16 ENCOUNTER — Ambulatory Visit: Payer: BLUE CROSS/BLUE SHIELD | Admitting: Occupational Therapy

## 2015-07-16 ENCOUNTER — Ambulatory Visit: Payer: BLUE CROSS/BLUE SHIELD | Admitting: Physical Therapy

## 2015-07-16 DIAGNOSIS — I69359 Hemiplegia and hemiparesis following cerebral infarction affecting unspecified side: Secondary | ICD-10-CM

## 2015-07-16 DIAGNOSIS — R269 Unspecified abnormalities of gait and mobility: Secondary | ICD-10-CM | POA: Diagnosis not present

## 2015-07-16 DIAGNOSIS — R252 Cramp and spasm: Secondary | ICD-10-CM

## 2015-07-16 DIAGNOSIS — R2681 Unsteadiness on feet: Secondary | ICD-10-CM

## 2015-07-16 NOTE — Therapy (Signed)
[facility] [address] [place], [place], [address] Phone: [phone]   Fax:  [phone]  Occupational Therapy Treatment  Patient Details  Name: [name] NissenRobert [name] MRN: [id number] Date of Birth: 1955-08-15 Referring Provider: Dr. Lucia GaskinsAhern  Encounter Date: 07/16/2015      OT End of Session - 07/16/15 0921    Visit Number 8   Number of Visits 17   Date for OT Re-Evaluation 08/03/15   Authorization Type BCBS   Authorization Time Period 20 visit limit   Authorization - Visit Number 8   Authorization - Number of Visits 20   OT Start Time 0845   OT Stop Time 0928   OT Time Calculation (min) 43 min   Equipment Utilized During Treatment estim   Activity Tolerance Patient tolerated treatment well      Past Medical History  Diagnosis Date  . Dysphagia   . DVT (deep venous thrombosis) (HCC)   . Hypertension   . Depression   . 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 Enlarged prostate     Past Surgical History  Procedure Laterality Date  . Esophagoscopy w/ percutaneous gastrostomy tube placement  04/12/2013    Dr Murrell ConverseHildreth[facility]    There were no vitals filed for this visit.  Visit Diagnosis:  Spasticity  Hemiparesis affecting dominant side as late effect of cerebrovascular accident [facility](HCC)      Subjective Assessment - 07/16/15 0846    Subjective  The brace is fine. I've been wearing at night and some during the day   Pertinent History see epic    Patient Stated Goals to use arm again, write name   Currently in Pain? No/denies                      OT Treatments/Exercises (OP) - 07/16/15 0915    Neurological Re-education Exercises   Other Exercises 1 Supine: self ROM in shoulder flexion to improve ROM and decrease stiffness at beginning of session with min cues  to perform correctly   Other Exercises 2 AA/ROM as able RUE for low range sh. flex with max facilitation for elbow extension and to prevent trunk compensation, followed by closed chain shoulder flexion for RUE activation. RUE dominated by synergy pattern   Other Weight-Bearing Exercises 1 Seated: wt bearing over RUE with trunk rotation and on elbow with max cueing required and mod facilitation to perform correctly   Modalities   Modalities Electrical Stimulation   Electrical Stimulation   Electrical Stimulation Location dorsal forearm   Electrical Stimulation Action wrist and finger extension   Electrical Stimulation Parameters 50 pps, 250 pw, 10 sec. cycle on/off, x 15 minutes (with Oval 8 on ring finger to prevent PIP flexion with MP extension)   Electrical Stimulation Goals Neuromuscular facilitation                  OT Short Term Goals - 06/06/15 1142    OT SHORT TERM GOAL #1   Title I with HEP.   Time 4   Period Weeks   Status New   OT SHORT TERM GOAL #2   Title Pt will verbalize understanding of RUE positioning to minimize risk for injury, pain  and contracture.   Time 4   Period Weeks   Status New   OT SHORT TERM GOAL #  3   Title Pt will demonstrate A/ROM shoulder flexion of 60* in prep for functional reach.   Time 4   Period Weeks   Status New   OT SHORT TERM GOAL #4   Title Pt will demonstrate ability to write his name with 70% legibility   Time 4   Period Weeks   Status New           OT Long Term Goals - 06/06/15 1143    OT LONG TERM GOAL #1   Title Pt will demonstrate ability to perform light home management / cooking modified independently demonstrating good safety awareness.   Time 8   Period Weeks   Status New   OT LONG TERM GOAL #2   Title Pt will report ability to use RUE as an active assist at least 50% of the time for light home management/ ADLs.   Time 8   Period Weeks   Status New   OT LONG TERM GOAL #3   Title Pt will demonstrate  improved RUE functional use as evidenced by performing 10 blocks on box/ blocks with RUE.   Time 8   Period Weeks   Status New   OT LONG TERM GOAL #4   Title Pt will demonstrate ability to write his name with 90% legibility   Time 8   Period Weeks   Status New               Plan - 07/16/15 4098    Clinical Impression Statement Pt dominated by syngery pattern and significant spasticity RUE. Pt demo poor body awareness and poor insight into long term deficits caused by CVA.   Plan assess STG's    Consulted and Agree with Plan of Care Patient        Problem List Patient Active Problem List   Diagnosis Date Noted  . OSA (obstructive sleep apnea) 10/11/2014  . Excessive daytime sleepiness 10/11/2014  . Hemiparesis (HCC) 10/11/2014  . Dysarthria 10/11/2014  . Pneumoperitoneum 05/01/2013  . Lower GI bleed 05/01/2013  . UTI (urinary tract infection) 05/01/2013  . Supratherapeutic INR 05/01/2013  . Chronic indwelling Foley catheter 05/01/2013  . Jejunostomy tube present (?originally G tube?) 05/01/2013  . Laceration of eyebrow s/p repair 04/26/2013 05/01/2013  . 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 Hemiparesis affecting right side as late effect of stroke (HCC) 05/01/2013  . 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 DVT of right axillary vein, acute 05/01/2013  . CVA (cerebral vascular accident) (HCC) 05/01/2013  . Anemia 05/01/2013  . Dysphagia     Kelli Churn, OTR/L 07/16/2015, 9:24 AM  [facility] [address] [place], [place], [address] Phone: [phone]   Fax:  [phone]  Name: [name] [name] MRN: [id number] Date of Birth: 1956-06-23

## 2015-07-16 NOTE — Therapy (Signed)
[facility] [address] [place], [place], [address] Phone: [phone]   Fax:  [phone]  Physical Therapy Treatment  Patient Details  Name: [name] [name] MRN: [id number] Date of Birth: 13-Sep-1955 Referring Provider: Sarina Ill, MD  Encounter Date: 07/16/2015      PT End of Session - 07/16/15 0907    Visit Number 11   Number of Visits 17   Date for PT Re-Evaluation 08/04/15   Authorization Type BCBS; 20 visit limit for PT   Authorization - Visit Number 11   Authorization - Number of Visits 20   PT Start Time 0803   PT Stop Time 0845   PT Time Calculation (min) 42 min   Activity Tolerance Patient tolerated treatment well   Behavior During Therapy [facility] for tasks assessed/performed      Past Medical History  Diagnosis Date  . Dysphagia   . DVT (deep venous thrombosis) ([place])   . Hypertension   . Depression   . CVA (cerebral vascular accident) ([place]) 05/01/2013    left pontine infarct  . 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 Enlarged prostate     Past Surgical History  Procedure Laterality Date  . Esophagoscopy w/ percutaneous gastrostomy tube placement  04/12/2013    Dr Rinaldo Cloud, [facility]    There were no vitals filed for this visit.  Visit Diagnosis:  Abnormality of gait  Unsteadiness  Spasticity  Hemiparesis affecting dominant side as late effect of cerebrovascular accident [facility])      Subjective Assessment - 07/16/15 0806    Subjective No family present with patient today. Pt saw orthotist last Friday. Pt states, "He (orthotist) put some screws in there to give me a little more freedom. It feels a little bit different. Not too much."   Pertinent History bilateral pontine CVA (03/31/13); basilar thrombectomy (04/05/13); HTN   Patient Stated Goals "I wish I could walk without the brace; take care of some of this stiffness; and get an  exercise routine," per pt. Pt's brother's goals: to improve safety awareness, to increase pt safety going up stairs with cane.   Currently in Pain? No/denies                         [facility] Adult PT Treatment/Exercise - 07/16/15 0001    Transfers   Transfers Sit to Stand;Stand to Sit   Sit to Stand 5: Supervision   Sit to Stand Details (indicate cue type and reason) cueing for management of R hand (placing in hand orthosis) with effective within-session carryover   Stand to Sit 5: Supervision   Stand to Sit Details cueing for managemement of R hand in R hand orthosis (removing from h/o prior to sitting) with inconsistent withins-session carryover.   Ambulation/Gait   Ambulation/Gait Yes   Ambulation/Gait Assistance 5: Supervision;4: Min guard   Ambulation Distance (Feet) 800 Feet  575' indoors; 225' outdoors   Assistive device Rolling walker;Other (Comment)  R hand orthosis; R AFO   Gait Pattern Step-through pattern;Decreased arm swing - right;Decreased step length - right;Decreased hip/knee flexion - right;Decreased stance time - right;Decreased weight shift to right;Abducted- right;Trunk rotated posteriorly on right;Right foot flat;Decreased dorsiflexion - right;Trunk flexed   Ambulation Surface Level;Indoor;Unlevel;Outdoor;Paved   Ramp 5: Supervision;4: Min assist   Ramp Details (indicate cue type and reason) x5 rep indoors, x2 reps outdoors with RW, R hand orthosis, and cueing for  maintaining RW on ground at all times due to pt tendency to lift RW off ground on R side (unsure if due to spasticity/hypertonia in RUE).   Curb 4: Min assist   Curb Details (indicate cue type and reason) Blocked practice of curb step negotiation with RW and R hand orthosis x5 reps then x2 reps indoors; then x3 reps outdoors. Pt initially required verbal/demo cueing for sequencing during ascent/descent, but did exhibit effective within-session carryover of sequencing after blocked practice. Pt did  not exhibit within-session carryover of cueing to avoid moving feet (stepping forward) while lifting/lowering walking onto/off of curb step.   Gait Comments During gait training, multimodal cueing focused on the following: maintaining RW in contact witrh ground at all times (especially during turning); turning slowly and in smaller increments (rather than lifting up walker and turning 180 degrees at once), and lateral weight shift to R side during gait.                PT Education - 07/16/15 0858    Education provided Yes   Education Details Safe use of RW during all mobility; emphasis on sequencing of curb step negotiation with RW.   Person(s) Educated Patient   Methods Explanation;Demonstration;Verbal cues;Handout   Comprehension Verbalized understanding;Returned demonstration          PT Short Term Goals - 07/13/15 1309    PT SHORT TERM GOAL #1   Title Pt will perform initial HEP with mod I using paper handout to maximize functional gains made in PT. Target date:07/03/15   Baseline Needs min cues throughout to complete HEP.    Status Partially Met   PT SHORT TERM GOAL #2   Title Complete Berg and improve score by 5 points from baseline to indicate improved functional standing balance. Target date:07/03/15   Baseline 10/31: baseline Berg score = 42/56, 12/2: 46/56   Status Partially Met   PT SHORT TERM GOAL #3   Title Pt will decrease TUG from 25.10 sec to < 20 sec to indicate increased efficiency of mobility. Target date:07/03/15   Status Partially Met   PT SHORT TERM GOAL #4   Title Pt will increase gait velocity from 1.23 ft/sec to 1.53 ft/sec to indicate increased efficiency of ambulation. Target date:07/03/15   Status Partially Met   PT SHORT TERM GOAL #5   Title Pt will negotiate 4 stairs with 2 rails (either side to simulate home entrance) while safely managing SPC to indicate increased safety using primary entrance of home.  Target date:07/03/15   Baseline Needs  continued cues for safety managing cane, otherwise does well with two handrails.    Status Partially Met   PT SHORT TERM GOAL #6   Title Pt will ambulate 200' over level, indoor surfaces with mod I with proper use of LRAD to indicate increased safety with household mobility, safe use of AD. Target date:07/03/15   Baseline Intermittent min cues for placing quad cane in complete contact with ground as he continues to keep it tilted.    Status Partially Met           PT Long Term Goals - 06/11/15 0925    PT LONG TERM GOAL #1   Title Pt will verbalize understanding of CVA warning signs, pertinent risk factors to prevent future CVA. Target date: 07/31/15   PT LONG TERM GOAL #2   Title Pt will verbalize understanding of fall prevention strategies to decrease fall risk in home environment. Target date: 07/31/15   PT  LONG TERM GOAL #3   Title Pt will negotiate standard ramp and curb step with mod I using LRAD to increase pt safety/independence traversing community obstacles. Target date: 07/31/15   PT LONG TERM GOAL #4   Title Pt will ambulate 500' over unlevel, paved surfaces with mod I using LRAD to indicate increased safety with limited community mobility. Target date: 07/31/15   PT LONG TERM GOAL #5   Title Pt will increase gait velocity from 1.23 ft/sec to > / = 1.83 ft/sec to indicate decreased risk of recurrent falls. Target date: 07/31/15   PT LONG TERM GOAL #6   Title Pt will improve Berg score by 8 points from baseline to indicate significant improvement in functional standing balance. Target date: 07/31/15   Baseline 10/31: baseline Berg score = 42/56   PT LONG TERM GOAL #7   Title Pt will decrease TUG time from 25.10 sec to < 15 seconds to indicate decreased fall risk. Target date: 07/31/15               Plan - 07/16/15 0907    Clinical Impression Statement Session focused on gait training with RW using R hand orthosis. Also noted that pt had R AFO modified last Friday. As  planned, orthotist modified solid AFO to articulating AFO with DF assist. Overall, gait stability did appear to be improved with RW, R hand orthosis (as compared with [facility]). Pt will likely require continued cueing to maintain RW on ground on R side (unsure if RUE flexor tone is attributing to this) and will need reinfocement for safe use on curb step.   Pt will benefit from skilled therapeutic intervention in order to improve on the following deficits Abnormal gait;Decreased cognition;Decreased knowledge of use of DME;Decreased coordination;Decreased mobility;Decreased endurance;Decreased activity tolerance;Decreased balance;Decreased range of motion;Decreased safety awareness;Impaired UE functional use;Decreased strength;Impaired tone;Impaired flexibility   Clinical Impairments Affecting Rehab Potential Transportation limitations (pt does not drive; brother works FT)   PT Frequency 2x / week   PT Duration 8 weeks   PT Treatment/Interventions DME Instruction;Electrical Stimulation;Gait training;Stair training;Functional mobility training;Therapeutic activities;Therapeutic exercise;Balance training;Patient/family education;Orthotic Fit/Training;Neuromuscular re-education;Manual techniques;Vestibular   PT Next Visit Plan Continue gait training with RW and R hand orthosis (opened R hand orthosis, size L, in supply closet). Need to clarify if pt has used insurance to cover AD in past 5 years.   Consulted and Agree with Plan of Care Patient        Problem List Patient Active Problem List   Diagnosis Date Noted  . OSA (obstructive sleep apnea) 10/11/2014  . Excessive daytime sleepiness 10/11/2014  . Hemiparesis ([place]) 10/11/2014  . Dysarthria 10/11/2014  . Pneumoperitoneum 05/01/2013  . Lower GI bleed 05/01/2013  . UTI (urinary tract infection) 05/01/2013  . Supratherapeutic INR 05/01/2013  . Chronic indwelling Foley catheter 05/01/2013  . Jejunostomy tube present (?originally G tube?) 05/01/2013   . Laceration of eyebrow s/p repair 04/26/2013 05/01/2013  . 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 Hemiparesis affecting right side as late effect of stroke ([place]) 05/01/2013  . 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 DVT of right axillary vein, acute 05/01/2013  . CVA (cerebral vascular accident) ([place]) 05/01/2013  . Anemia 05/01/2013  . Dysphagia     Billie Ruddy, PT, DPT [facility] [address] [place], [place], [address] Phone: [phone]   Fax:  [phone] 07/16/2015, 9:13 AM   Name: [name] [name] MRN: [id number] Date of Birth: August 15, 1955

## 2015-07-18 ENCOUNTER — Encounter: Payer: BLUE CROSS/BLUE SHIELD | Admitting: Occupational Therapy

## 2015-07-20 ENCOUNTER — Encounter: Payer: Self-pay | Admitting: Rehabilitation

## 2015-07-20 ENCOUNTER — Ambulatory Visit: Payer: BLUE CROSS/BLUE SHIELD | Admitting: Rehabilitation

## 2015-07-20 ENCOUNTER — Ambulatory Visit: Payer: BLUE CROSS/BLUE SHIELD | Admitting: *Deleted

## 2015-07-20 DIAGNOSIS — R279 Unspecified lack of coordination: Secondary | ICD-10-CM

## 2015-07-20 DIAGNOSIS — IMO0002 Reserved for concepts with insufficient information to code with codable children: Secondary | ICD-10-CM

## 2015-07-20 DIAGNOSIS — R2681 Unsteadiness on feet: Secondary | ICD-10-CM

## 2015-07-20 DIAGNOSIS — I69359 Hemiplegia and hemiparesis following cerebral infarction affecting unspecified side: Secondary | ICD-10-CM

## 2015-07-20 DIAGNOSIS — R269 Unspecified abnormalities of gait and mobility: Secondary | ICD-10-CM

## 2015-07-20 DIAGNOSIS — I69319 Unspecified symptoms and signs involving cognitive functions following cerebral infarction: Secondary | ICD-10-CM

## 2015-07-20 DIAGNOSIS — R278 Other lack of coordination: Secondary | ICD-10-CM

## 2015-07-20 NOTE — Therapy (Signed)
[facility] [address] [place], [place], [address] Phone: [phone]   Fax:  [phone]  Occupational Therapy Treatment  Patient Details  Name: [name] [name] MRN: [id number] Date of Birth: 1955/12/19 Referring Provider: Dr. Jaynee Eagles  Encounter Date: 07/20/2015      OT End of Session - 07/20/15 0955    Visit Number 9   Number of Visits 17   Date for OT Re-Evaluation 08/03/15   Authorization Type BCBS   Authorization Time Period 20 visit limit   Authorization - Visit Number 9   Authorization - Number of Visits 20   OT Start Time 0805   OT Stop Time 0850   OT Time Calculation (min) 45 min   Activity Tolerance Patient tolerated treatment well   Behavior During Therapy [facility] for tasks assessed/performed      Past Medical History  Diagnosis Date  . Dysphagia   . DVT (deep venous thrombosis) ([place])   . Hypertension   . Depression   . CVA (cerebral vascular accident) ([place]) 05/01/2013    left pontine infarct  . 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 Enlarged prostate     Past Surgical History  Procedure Laterality Date  . Esophagoscopy w/ percutaneous gastrostomy tube placement  04/12/2013    Dr Rinaldo Cloud, [facility]    There were no vitals filed for this visit.  Visit Diagnosis:  Cognitive deficits following cerebral infarction  Decreased coordination  Weakness due to cerebrovascular accident  Hemiparesis affecting dominant side as late effect of cerebrovascular accident [facility])      Subjective Assessment - 07/20/15 0810    Subjective  "I'm feeling a bit sluggish today, I feel lazy and weaker than usual". "I've been wearing my splint some, just not during exercises"    Pertinent History see epic    Patient Stated Goals get back to "close to 100%"   Currently in Pain? Yes   Pain Score 3    Pain Location Finger (Comment which one)  thumb   Pain  Orientation Left   Pain Descriptors / Indicators Aching   Pain Type Chronic pain   Pain Onset 1 to 4 weeks ago   Pain Frequency Intermittent   Aggravating Factors  overuse   Pain Relieving Factors rest   Multiple Pain Sites No      Engaged patient in stretching(shoulder flexion/extension, shoulder abduction, shoulder external/internal rotation) and NMR to right shoulder in supine position, using square PVC pipe for NMR. Therapist assisted with shoulder flexion and chest press, providing max assist due to spasticity and stiffness.  Pt sat at high/low table for elbow flexion/extension and shoulder horizontal abduction exercises. Therapist facilitating movement and fluidity.    Pt then engaged in therapeutic activity focusing on handwriting. Therapist wrote out patient's name in [place] and had patient trace name. Patient's handwriting is ~40% legible.  Pt using built up foam on pen. Therapist administered patient piece of foam to take home and encouraged patient to practice handwriting skills.   Pt continued to perseverate on his stroke and on things he continues to have a hard time doing/completing.                         OT Education - 07/20/15 [phone]    Education provided Yes   Education Details use of built of foam for assist with writing tasks    Person(s) Educated Patient  Methods Explanation   Comprehension Verbalized understanding          OT Short Term Goals - 07/20/15 0954    OT SHORT TERM GOAL #1   Title I with HEP.   Time 4   Period Weeks   Status On-going   OT SHORT TERM GOAL #2   Title Pt will verbalize understanding of RUE positioning to minimize risk for injury, pain  and contracture.   Time 4   Period Weeks   Status On-going   OT SHORT TERM GOAL #3   Title Pt will demonstrate A/ROM shoulder flexion of 60* in prep for functional reach.   Time 4   Period Weeks   Status On-going   OT SHORT TERM GOAL #4   Title Pt will demonstrate  ability to write his name with 70% legibility   Time 4   Period Weeks   Status Partially Met           OT Long Term Goals - 07/20/15 0955    OT LONG TERM GOAL #1   Title Pt will demonstrate ability to perform light home management / cooking modified independently demonstrating good safety awareness.   Time 8   Period Weeks   Status On-going   OT LONG TERM GOAL #2   Title Pt will report ability to use RUE as an active assist at least 50% of the time for light home management/ ADLs.   Time 8   Period Weeks   Status On-going   OT LONG TERM GOAL #3   Title Pt will demonstrate improved RUE functional use as evidenced by performing 10 blocks on box/ blocks with RUE.   Time 8   Period Weeks   Status On-going   OT LONG TERM GOAL #4   Title Pt will demonstrate ability to write his name with 90% legibility   Time 8   Period Weeks   Status On-going               Plan - 07/20/15 0951    Clinical Impression Statement Pt with slow progression. Pt continues to have poor insight into long term deficits caused by CVA. Pt perseverating on not being able to complete certain tasks at hand during this OT treatment session.    Pt will benefit from skilled therapeutic intervention in order to improve on the following deficits (Retired) Abnormal gait;Decreased coordination;Decreased range of motion;Difficulty walking;Impaired flexibility;Decreased endurance;Decreased safety awareness;Increased edema;Impaired sensation;Obesity;Decreased balance;Decreased knowledge of use of DME;Impaired UE functional use;Pain;Impaired perceived functional ability;Decreased strength;Decreased mobility;Decreased cognition;Impaired tone;Decreased activity tolerance;Decreased knowledge of precautions   Rehab Potential Fair   Clinical Impairments Affecting Rehab Potential spasticity   OT Frequency 2x / week   OT Duration 8 weeks   OT Treatment/Interventions Self-care/ADL training;Therapeutic exercise;Patient/family  education;Balance training;Splinting;Manual Therapy;Neuromuscular education;Ultrasound;Iontophoresis;Therapeutic exercises;Therapeutic activities;DME and/or AE instruction;Parrafin;Cryotherapy;Electrical Stimulation;Fluidtherapy;Gait Training;Cognitive remediation/compensation;Visual/perceptual remediation/compensation;Passive range of motion;Contrast Bath;Moist Heat   Plan assess STG's, writing activity (pt successful with tracing highlighted letters, but didn't seem to like doing this because he felt it was juvenile)   Consulted and Agree with Plan of Care Patient        Problem List Patient Active Problem List   Diagnosis Date Noted  . OSA (obstructive sleep apnea) 10/11/2014  . Excessive daytime sleepiness 10/11/2014  . Hemiparesis ([place]) 10/11/2014  . Dysarthria 10/11/2014  . Pneumoperitoneum 05/01/2013  . Lower GI bleed 05/01/2013  . UTI (urinary tract infection) 05/01/2013  . Supratherapeutic INR 05/01/2013  . Chronic indwelling Foley catheter 05/01/2013  .  Jejunostomy tube present (?originally G tube?) 05/01/2013  . Laceration of eyebrow s/p repair 04/26/2013 05/01/2013  . 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 Hemiparesis affecting right side as late effect of stroke ([place]) 05/01/2013  . 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 DVT of right axillary vein, acute 05/01/2013  . CVA (cerebral vascular accident) ([place]) 05/01/2013  . Anemia 05/01/2013  . Dysphagia     Kimani Hovis , MS, OTR/L, CLT Pager: [phone]  07/20/2015, 10:24 AM  [facility] [address] [place], [place], [address] Phone: [phone]   Fax:  [phone]  Name: Revel Stellmach MRN: [id number] Date of Birth: 1956-03-01

## 2015-07-20 NOTE — Therapy (Signed)
[facility] [address] [place], [place], [address] Phone: [phone]   Fax:  [phone]  Physical Therapy Treatment  Patient Details  Name: [name] [name] MRN: [id number] Date of Birth: 07/02/1956 Referring Provider: Sarina Ill, MD  Encounter Date: 07/20/2015      PT End of Session - 07/20/15 0853    Visit Number 12   Number of Visits 17   Date for PT Re-Evaluation 08/04/15   Authorization Type BCBS; 20 visit limit for PT   Authorization - Visit Number 11   Authorization - Number of Visits 20   PT Start Time 5945   PT Stop Time 0930   PT Time Calculation (min) 43 min   Activity Tolerance Patient tolerated treatment well   Behavior During Therapy [facility] for tasks assessed/performed      Past Medical History  Diagnosis Date  . Dysphagia   . DVT (deep venous thrombosis) ([place])   . Hypertension   . Depression   . CVA (cerebral vascular accident) ([place]) 05/01/2013    left pontine infarct  . 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 Enlarged prostate     Past Surgical History  Procedure Laterality Date  . Esophagoscopy w/ percutaneous gastrostomy tube placement  04/12/2013    Dr Rinaldo Cloud, [facility]    There were no vitals filed for this visit.  Visit Diagnosis:  Abnormality of gait  Unsteadiness  Hemiparesis affecting dominant side as late effect of cerebrovascular accident ([name])  Weakness due to cerebrovascular accident      Subjective Assessment - 07/20/15 0851    Subjective "I should be able to do these things."  (in reference to writing from previous OT session)   Pertinent History bilateral pontine CVA (03/31/13); basilar thrombectomy (04/05/13); HTN   Patient Stated Goals "I wish I could walk without the brace; take care of some of this stiffness; and get an exercise routine," per pt. Pt's brother's goals: to improve safety awareness, to  increase pt safety going up stairs with cane.   Currently in Pain? No/denies            Gait:  Continue to address gait with use of RW and R hand orthosis for improved carryover of safety and technique during gait, ramp, curb negotiation as well as safety with stairs.  Pt able to ambulate 345' during session at S level with cues for upright posture throughout, increased WB through RUE to prevent RW from "picking up" (feel this may be due to increased tone), and for safety when making turns as he tends to pick walker up and then turn with RW in the air.  Performed ramp and curb during session x 1 rep up and back with use of RW.  Pt able to recall approx 80% of technique and sequencing, however did require min cues for safety when descending curb.  Performed stairs x 1 reps with L rail.  States that he is having ramp built at home, however educated that pt may need to perform stairs in community or when visiting friends/family.  Performed at S level with single cue for technique when descending stairs.     NMR:  Performed standing stepping task in // bars with RLE placed on 4" step while LLE stepped up to chair for increased WB through RLE and increased quad and glute activation during R stance to carryover to gait.  Note difficulty initially as pt was very  forward flexed therefore provided manual facilitation and use of mirror for increased visual feedback on posture throughout and to utilize mirror when stepping rather than looking down to chair.  Also went over standing counter top exercise, as pt states he was doing at home sitting.  Performed LLE abduction x 15 reps with cues for slower speed of movement for increased time in R SLS as well as increased activation of RLE as he tends to keep RLE in flexed posture.                       PT Education - 07/20/15 [phone]    Education provided Yes   Education Details Safe use of RW during all mobility, notified pt of PT checking cost of  hand splint, and education to check to see if pt has RW at brother's house.    Person(s) Educated Patient   Methods Explanation   Comprehension Verbalized understanding          PT Short Term Goals - 07/13/15 1309    PT SHORT TERM GOAL #1   Title Pt will perform initial HEP with mod I using paper handout to maximize functional gains made in PT. Target date:07/03/15   Baseline Needs min cues throughout to complete HEP.    Status Partially Met   PT SHORT TERM GOAL #2   Title Complete Berg and improve score by 5 points from baseline to indicate improved functional standing balance. Target date:07/03/15   Baseline 10/31: baseline Berg score = 42/56, 12/2: 46/56   Status Partially Met   PT SHORT TERM GOAL #3   Title Pt will decrease TUG from 25.10 sec to < 20 sec to indicate increased efficiency of mobility. Target date:07/03/15   Status Partially Met   PT SHORT TERM GOAL #4   Title Pt will increase gait velocity from 1.23 ft/sec to 1.53 ft/sec to indicate increased efficiency of ambulation. Target date:07/03/15   Status Partially Met   PT SHORT TERM GOAL #5   Title Pt will negotiate 4 stairs with 2 rails (either side to simulate home entrance) while safely managing SPC to indicate increased safety using primary entrance of home.  Target date:07/03/15   Baseline Needs continued cues for safety managing cane, otherwise does well with two handrails.    Status Partially Met   PT SHORT TERM GOAL #6   Title Pt will ambulate 200' over level, indoor surfaces with mod I with proper use of LRAD to indicate increased safety with household mobility, safe use of AD. Target date:07/03/15   Baseline Intermittent min cues for placing quad cane in complete contact with ground as he continues to keep it tilted.    Status Partially Met           PT Long Term Goals - 06/11/15 0925    PT LONG TERM GOAL #1   Title Pt will verbalize understanding of CVA warning signs, pertinent risk factors to prevent  future CVA. Target date: 07/31/15   PT LONG TERM GOAL #2   Title Pt will verbalize understanding of fall prevention strategies to decrease fall risk in home environment. Target date: 07/31/15   PT LONG TERM GOAL #3   Title Pt will negotiate standard ramp and curb step with mod I using LRAD to increase pt safety/independence traversing community obstacles. Target date: 07/31/15   PT LONG TERM GOAL #4   Title Pt will ambulate 500' over unlevel, paved surfaces with mod I using LRAD to  indicate increased safety with limited community mobility. Target date: 07/31/15   PT LONG TERM GOAL #5   Title Pt will increase gait velocity from 1.23 ft/sec to > / = 1.83 ft/sec to indicate decreased risk of recurrent falls. Target date: 07/31/15   PT LONG TERM GOAL #6   Title Pt will improve Berg score by 8 points from baseline to indicate significant improvement in functional standing balance. Target date: 07/31/15   Baseline 10/31: baseline Berg score = 42/56   PT LONG TERM GOAL #7   Title Pt will decrease TUG time from 25.10 sec to < 15 seconds to indicate decreased fall risk. Target date: 07/31/15               Plan - 07/20/15 0853    Clinical Impression Statement Skilled session focused on continued blocked gait training with use of RW and R hand orthosis for improved carryover on technique.  Also addressed curb/ramp and stair negotiation as well as R NMR during session.  Pt continues to demonstrate poor carryover between sessions and requires constant cues for technique and safety at home.     Pt will benefit from skilled therapeutic intervention in order to improve on the following deficits Abnormal gait;Decreased cognition;Decreased knowledge of use of DME;Decreased coordination;Decreased mobility;Decreased endurance;Decreased activity tolerance;Decreased balance;Decreased range of motion;Decreased safety awareness;Impaired UE functional use;Decreased strength;Impaired tone;Impaired flexibility    Clinical Impairments Affecting Rehab Potential Transportation limitations (pt does not drive; brother works FT)   PT Frequency 2x / week   PT Duration 8 weeks   PT Treatment/Interventions DME Instruction;Electrical Stimulation;Gait training;Stair training;Functional mobility training;Therapeutic activities;Therapeutic exercise;Balance training;Patient/family education;Orthotic Fit/Training;Neuromuscular re-education;Manual techniques;Vestibular   PT Next Visit Plan Continue gait training with RW and R hand orthosis (opened R hand orthosis, size L, in supply closet). Pt to check and see if he has RW at home, need to check cost of hand splint.    Consulted and Agree with Plan of Care Patient        Problem List Patient Active Problem List   Diagnosis Date Noted  . OSA (obstructive sleep apnea) 10/11/2014  . Excessive daytime sleepiness 10/11/2014  . Hemiparesis ([place]) 10/11/2014  . Dysarthria 10/11/2014  . Pneumoperitoneum 05/01/2013  . Lower GI bleed 05/01/2013  . UTI (urinary tract infection) 05/01/2013  . Supratherapeutic INR 05/01/2013  . Chronic indwelling Foley catheter 05/01/2013  . Jejunostomy tube present (?originally G tube?) 05/01/2013  . Laceration of eyebrow s/p repair 04/26/2013 05/01/2013  . 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 Hemiparesis affecting right side as late effect of stroke ([place]) 05/01/2013  . 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 DVT of right axillary vein, acute 05/01/2013  . CVA (cerebral vascular accident) ([place]) 05/01/2013  . Anemia 05/01/2013  . Dysphagia    Cameron Sprang, PT, MPT [facility] [address] [place], [place], [address] Phone: [phone]   Fax:  [phone] 07/20/2015, 9:39 AM  Name: Patty Lopezgarcia MRN: [id number] Date of Birth: 10/24/1955

## 2015-07-23 ENCOUNTER — Ambulatory Visit: Payer: BLUE CROSS/BLUE SHIELD | Admitting: Occupational Therapy

## 2015-07-23 ENCOUNTER — Ambulatory Visit: Payer: BLUE CROSS/BLUE SHIELD | Admitting: Physical Therapy

## 2015-07-23 ENCOUNTER — Encounter: Payer: Self-pay | Admitting: Occupational Therapy

## 2015-07-23 VITALS — BP 147/90 | HR 71

## 2015-07-23 DIAGNOSIS — I69359 Hemiplegia and hemiparesis following cerebral infarction affecting unspecified side: Secondary | ICD-10-CM

## 2015-07-23 DIAGNOSIS — R252 Cramp and spasm: Secondary | ICD-10-CM

## 2015-07-23 DIAGNOSIS — R269 Unspecified abnormalities of gait and mobility: Secondary | ICD-10-CM | POA: Diagnosis not present

## 2015-07-23 NOTE — Therapy (Signed)
[facility] [address] [place], [place], [address] Phone: [phone]   Fax:  [phone]  Occupational Therapy Treatment  Patient Details  Name: [name] [name] MRN: [id number] Date of Birth: July 10, 1956 Referring Provider: Dr. Jaynee Eagles  Encounter Date: 07/23/2015      OT End of Session - 07/23/15 0902    Visit Number 10   Number of Visits 17   Date for OT Re-Evaluation 08/03/15   Authorization Type BCBS   Authorization Time Period 20 visit limit   Authorization - Visit Number 10   Authorization - Number of Visits 20   OT Start Time 0815   OT Stop Time 0905   OT Time Calculation (min) 50 min   Equipment Utilized During Treatment estim   Activity Tolerance Patient tolerated treatment well      Past Medical History  Diagnosis Date  . Dysphagia   . DVT (deep venous thrombosis) ([place])   . Hypertension   . Depression   . CVA (cerebral vascular accident) ([place]) 05/01/2013    left pontine infarct  . 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 Enlarged prostate     Past Surgical History  Procedure Laterality Date  . Esophagoscopy w/ percutaneous gastrostomy tube placement  04/12/2013    Dr Rinaldo Cloud, [place]:   07/23/15 0818  BP: 147/90  Pulse: 71    Visit Diagnosis:  Hemiparesis affecting dominant side as late effect of cerebrovascular accident [facility])  Spasticity      Subjective Assessment - 07/23/15 0817    Subjective  I feel a little lethargic   Pertinent History see epic    Patient Stated Goals get back to "close to 100%"   Currently in Pain? No/denies                      OT Treatments/Exercises (OP) - 07/23/15 0001    Neurological Re-education Exercises   Other Exercises 1 Supine: AA/ROM in chest press motion initially with max assist and c/o some pain along biceps when lowering into extension. Pt point tender  over bicep muscle belly (more distal) . However, no pain after below treatment   Other Exercises 2 Supine: after P/ROM and initial stretching, pt able to perform shoulder flexion and extension with only min assist. Elbow extension against gravity with only min facilitation to prevent compensations. Pt then progressed to shoulder flexion/extension bilaterally with cane without assist   Other Weight-Bearing Exercises 1 Seated: wt bearing over Rt elbow with hand over hand technique and min to mod cues for proper technique and counting to avoid holding breath   Electrical Stimulation   Electrical Stimulation Location dorsal forearm   Electrical Stimulation Action wrist and finger extension   Electrical Stimulation Parameters 50 pps, 250 pw, 10 sec cycle on/off x 15 minutes   Electrical Stimulation Goals Neuromuscular facilitation   Manual Therapy   Manual Therapy Passive ROM;Taping   Passive ROM Therapist performed P/ROM in shoulder flexion and extension followed by taping below in supine. Therapist also performed shoulder extension and ER stretch while pt seated and actively working on scapula retraction and trunk extension.    Kinesiotex Inhibit Muscle  biceps for pain management                  OT Short Term Goals - 07/20/15 0954    OT SHORT TERM GOAL #1   Title  I with HEP.   Time 4   Period Weeks   Status On-going   OT SHORT TERM GOAL #2   Title Pt will verbalize understanding of RUE positioning to minimize risk for injury, pain  and contracture.   Time 4   Period Weeks   Status On-going   OT SHORT TERM GOAL #3   Title Pt will demonstrate A/ROM shoulder flexion of 60* in prep for functional reach.   Time 4   Period Weeks   Status On-going   OT SHORT TERM GOAL #4   Title Pt will demonstrate ability to write his name with 70% legibility   Time 4   Period Weeks   Status Partially Met           OT Long Term Goals - 07/20/15 0955    OT LONG TERM GOAL #1   Title Pt  will demonstrate ability to perform light home management / cooking modified independently demonstrating good safety awareness.   Time 8   Period Weeks   Status On-going   OT LONG TERM GOAL #2   Title Pt will report ability to use RUE as an active assist at least 50% of the time for light home management/ ADLs.   Time 8   Period Weeks   Status On-going   OT LONG TERM GOAL #3   Title Pt will demonstrate improved RUE functional use as evidenced by performing 10 blocks on box/ blocks with RUE.   Time 8   Period Weeks   Status On-going   OT LONG TERM GOAL #4   Title Pt will demonstrate ability to write his name with 90% legibility   Time 8   Period Weeks   Status On-going               Plan - 07/23/15 0903    Clinical Impression Statement Pt with potential biceps tendonitis. Pt appeared to have decreased spasticity at elbow today demo by ability to perform elbow extension against gravity with only min facilitation   Plan assess STG's   Consulted and Agree with Plan of Care Patient        Problem List Patient Active Problem List   Diagnosis Date Noted  . OSA (obstructive sleep apnea) 10/11/2014  . Excessive daytime sleepiness 10/11/2014  . Hemiparesis ([place]) 10/11/2014  . Dysarthria 10/11/2014  . Pneumoperitoneum 05/01/2013  . Lower GI bleed 05/01/2013  . UTI (urinary tract infection) 05/01/2013  . Supratherapeutic INR 05/01/2013  . Chronic indwelling Foley catheter 05/01/2013  . Jejunostomy tube present (?originally G tube?) 05/01/2013  . Laceration of eyebrow s/p repair 04/26/2013 05/01/2013  . 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 Hemiparesis affecting right side as late effect of stroke ([place]) 05/01/2013  . 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 DVT of right axillary vein, acute 05/01/2013  . CVA (cerebral vascular accident) ([place]) 05/01/2013  . Anemia 05/01/2013  . Dysphagia     Carey Bullocks, OTR/L  07/23/2015, 9:05  AM  [facility] [address] [place], [place], [address] Phone: [phone]   Fax:  [phone]  Name: [name] [name] MRN: [id number] Date of Birth: 11-06-1955

## 2015-07-25 ENCOUNTER — Encounter: Payer: BLUE CROSS/BLUE SHIELD | Admitting: Occupational Therapy

## 2015-07-27 ENCOUNTER — Ambulatory Visit: Payer: BLUE CROSS/BLUE SHIELD | Admitting: Occupational Therapy

## 2015-07-27 ENCOUNTER — Ambulatory Visit: Payer: BLUE CROSS/BLUE SHIELD | Admitting: Physical Therapy

## 2015-07-30 ENCOUNTER — Ambulatory Visit: Payer: BLUE CROSS/BLUE SHIELD | Admitting: Rehabilitation

## 2015-07-30 ENCOUNTER — Encounter: Payer: Self-pay | Admitting: Occupational Therapy

## 2015-07-30 ENCOUNTER — Ambulatory Visit: Payer: BLUE CROSS/BLUE SHIELD | Admitting: Occupational Therapy

## 2015-07-30 DIAGNOSIS — R252 Cramp and spasm: Secondary | ICD-10-CM

## 2015-07-30 DIAGNOSIS — R269 Unspecified abnormalities of gait and mobility: Secondary | ICD-10-CM | POA: Diagnosis not present

## 2015-07-30 DIAGNOSIS — I69359 Hemiplegia and hemiparesis following cerebral infarction affecting unspecified side: Secondary | ICD-10-CM

## 2015-07-30 NOTE — Therapy (Signed)
[facility] [address] [place], [place], [address] Phone: [phone]   Fax:  [phone]  Occupational Therapy Treatment  Patient Details  Name: [name] [name] MRN: [id number] Date of Birth: 03/09/1956 Referring Provider: Dr. Jaynee Eagles  Encounter Date: 07/30/2015      OT End of Session - 07/30/15 0927    Visit Number 11   Number of Visits 17   Date for OT Re-Evaluation 08/03/15   Authorization Type BC[employer]   Authorization Time Period 20 visit limit   Authorization - Visit Number 11   Authorization - Number of Visits 20   OT Start Time 0850   OT Stop Time 0930   OT Time Calculation (min) 40 min   Equipment Utilized During Treatment estim   Activity Tolerance Patient tolerated treatment well      Past Medical History  Diagnosis Date  . Dysphagia   . DVT (deep venous thrombosis) ([place])   . Hypertension   . Depression   . 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 Enlarged prostate     Past Surgical History  Procedure Laterality Date  . Esophagoscopy w/ percutaneous gastrostomy tube placement  04/12/2013    Dr Rinaldo Cloud, [facility]    There were no vitals filed for this visit.  Visit Diagnosis:  Hemiparesis affecting dominant side as late effect of cerebrovascular accident [facility])  Spasticity      Subjective Assessment - 07/30/15 0853    Subjective  My fingers feel a little numb   Pertinent History see epic    Patient Stated Goals get back to "close to 100%"   Currently in Pain? No/denies                      OT Treatments/Exercises (OP) - 07/30/15 0001    ADLs   Writing Practiced writing name in print with built up pen and compensations at approx. 85% legibility and extra time. Practiced in cursive with built up pen at approx. 70% legibility   ADL Comments  Assessed STG's and progress to date.    Neurological Re-education Exercises   Other Exercises 1 AA/ROM in shoulder flex/ext low level and mid level. AA/ROM in shoulder abduction low level. Both requiring mod facilitation/assist and reminders to breathe   Electrical Stimulation   Electrical Stimulation Location dorsal forearm   Electrical Stimulation Action wrist and finger extension   Electrical Stimulation Parameters 50 pps, 250 pw, 10 sec. cycle on/off x 15 min.    Electrical Stimulation Goals Neuromuscular facilitation                  OT Short Term Goals - 07/30/15 0902    OT SHORT TERM GOAL #1   Title I with HEP.   Time 4   Period Weeks   Status Achieved   OT SHORT TERM GOAL #2   Title Pt will verbalize understanding of RUE positioning to minimize risk for injury, pain  and contracture.   Time 4   Period Weeks   Status On-going   OT SHORT TERM GOAL #3   Title Pt will demonstrate A/ROM shoulder flexion of 60* in prep for functional reach.   Time 4   Period Weeks   Status Achieved   OT SHORT TERM GOAL #4   Title Pt will demonstrate ability to write his name  with 70% legibility   Time 4   Period Weeks   Status Achieved           OT Long Term Goals - 07/20/15 0955    OT LONG TERM GOAL #1   Title Pt will demonstrate ability to perform light home management / cooking modified independently demonstrating good safety awareness.   Time 8   Period Weeks   Status On-going   OT LONG TERM GOAL #2   Title Pt will report ability to use RUE as an active assist at least 50% of the time for light home management/ ADLs.   Time 8   Period Weeks   Status On-going   OT LONG TERM GOAL #3   Title Pt will demonstrate improved RUE functional use as evidenced by performing 10 blocks on box/ blocks with RUE.   Time 8   Period Weeks   Status On-going   OT LONG TERM GOAL #4   Title Pt will demonstrate ability to write his name with 90% legibility   Time 8   Period Weeks    Status On-going               Plan - 07/30/15 0902    Clinical Impression Statement Pt met 3/4 STG's. Pt with improved ability to write with extra time   Plan continue NMR and estim Rt hand   Consulted and Agree with Plan of Care Patient        Problem List Patient Active Problem List   Diagnosis Date Noted  . OSA (obstructive sleep apnea) 10/11/2014  . Excessive daytime sleepiness 10/11/2014  . Hemiparesis ([place]) 10/11/2014  . Dysarthria 10/11/2014  . Pneumoperitoneum 05/01/2013  . Lower GI bleed 05/01/2013  . UTI (urinary tract infection) 05/01/2013  . Supratherapeutic INR 05/01/2013  . Chronic indwelling Foley catheter 05/01/2013  . Jejunostomy tube present (?originally G tube?) 05/01/2013  . Laceration of eyebrow s/p repair 04/26/2013 05/01/2013  . 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 Hemiparesis affecting right side as late effect of stroke ([place]) 05/01/2013  . 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 DVT of right axillary vein, acute 05/01/2013  . CVA (cerebral vascular accident) ([place]) 05/01/2013  . Anemia 05/01/2013  . Dysphagia     Carey Bullocks, OTR/L 07/30/2015, 9:28 AM  [facility] [address] [place], [place], [address] Phone: [phone]   Fax:  [phone]  Name: Usiel Astarita MRN: [id number] Date of Birth: 09-Dec-1955

## 2015-08-01 ENCOUNTER — Encounter: Payer: BLUE CROSS/BLUE SHIELD | Admitting: Occupational Therapy

## 2015-08-03 ENCOUNTER — Ambulatory Visit: Payer: BLUE CROSS/BLUE SHIELD | Admitting: Rehabilitation

## 2015-08-07 ENCOUNTER — Ambulatory Visit: Payer: BLUE CROSS/BLUE SHIELD | Admitting: Rehabilitation

## 2015-08-09 ENCOUNTER — Ambulatory Visit: Payer: BLUE CROSS/BLUE SHIELD | Admitting: Occupational Therapy

## 2015-08-09 DIAGNOSIS — R252 Cramp and spasm: Secondary | ICD-10-CM

## 2015-08-09 DIAGNOSIS — R279 Unspecified lack of coordination: Secondary | ICD-10-CM

## 2015-08-09 DIAGNOSIS — I69319 Unspecified symptoms and signs involving cognitive functions following cerebral infarction: Secondary | ICD-10-CM

## 2015-08-09 DIAGNOSIS — R278 Other lack of coordination: Secondary | ICD-10-CM

## 2015-08-09 DIAGNOSIS — I69359 Hemiplegia and hemiparesis following cerebral infarction affecting unspecified side: Secondary | ICD-10-CM

## 2015-08-09 DIAGNOSIS — R269 Unspecified abnormalities of gait and mobility: Secondary | ICD-10-CM | POA: Diagnosis not present

## 2015-08-09 NOTE — Therapy (Signed)
[facility] [address] [place], [place], [address] Phone: [phone]   Fax:  [phone]  Occupational Therapy Treatment  Patient Details  Name: [name] [name] MRN: [id number] Date of Birth: 05-17-1956 Referring Provider: Dr. Lucia Gaskins  Encounter Date: 08/09/2015      OT End of Session - 08/09/15 0816    Visit Number 12   Authorization Type BCBS   Authorization Time Period 20 visit limit   Authorization - Visit Number 12   Authorization - Number of Visits 20   OT Start Time 0809   OT Stop Time 0845   OT Time Calculation (min) 36 min   Activity Tolerance Patient tolerated treatment well   Behavior During Therapy [facility] for tasks assessed/performed      Past Medical History  Diagnosis Date  . Dysphagia   . DVT (deep venous thrombosis) ([facility])   . Hypertension   . Depression   . 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 Enlarged prostate     Past Surgical History  Procedure Laterality Date  . Esophagoscopy w/ percutaneous gastrostomy tube placement  04/12/2013    Dr Murrell Converse, [facility]    There were no vitals filed for this visit.  Visit Diagnosis:  Hemiparesis affecting dominant side as late effect of cerebrovascular accident [facility])  Spasticity  Cognitive deficits following cerebral infarction  Decreased coordination      Subjective Assessment - 08/09/15 1231    Subjective  Pt reports using his RUE some   Pertinent History see epic    Patient Stated Goals get back to "close to 100%"   Currently in Pain? No/denies        Treatment: supine cane exercises for shoulder flexion AA/ROM min facilitation/ v.c. For positioning. Seated weightbearing through right elbow and hand, passive stretch at wrist in extension  Seated at table handwriting activity to trace letters with  RUE using highlighter, min-mod difficulty Discussed plans for d/c tomorrow.                        OT Short Term Goals - 07/30/15 0902    OT SHORT TERM GOAL #1   Title I with HEP.   Time 4   Period Weeks   Status Achieved   OT SHORT TERM GOAL #2   Title Pt will verbalize understanding of RUE positioning to minimize risk for injury, pain  and contracture.   Time 4   Period Weeks   Status On-going   OT SHORT TERM GOAL #3   Title Pt will demonstrate A/ROM shoulder flexion of 60* in prep for functional reach.   Time 4   Period Weeks   Status Achieved   OT SHORT TERM GOAL #4   Title Pt will demonstrate ability to write his name with 70% legibility   Time 4   Period Weeks   Status Achieved           OT Long Term Goals - 08/09/15 0841    OT LONG TERM GOAL #1   Title Pt will demonstrate ability to perform light home management / cooking modified independently demonstrating good safety awareness.   Status On-going   OT LONG TERM GOAL #2   Title Pt will report ability to use RUE as an active assist at least 50% of the time for light home  management/ ADLs.   Status On-going   OT LONG TERM GOAL #3   Title Pt will demonstrate improved RUE functional use as evidenced by performing 10 blocks on box/ blocks with RUE.   Status On-going   OT LONG TERM GOAL #4   Title Pt will demonstrate ability to write his name with 90% legibility   Status On-going               Plan - 08/09/15 0818    Pt will benefit from skilled therapeutic intervention in order to improve on the following deficits (Retired) Abnormal gait;Decreased coordination;Decreased range of motion;Difficulty walking;Impaired flexibility;Decreased endurance;Decreased safety awareness;Increased edema;Impaired sensation;Obesity;Decreased balance;Decreased knowledge of use of DME;Impaired UE functional use;Pain;Impaired perceived functional ability;Decreased strength;Decreased mobility;Decreased  cognition;Impaired tone;Decreased activity tolerance;Decreased knowledge of precautions   Rehab Potential Fair   Clinical Impairments Affecting Rehab Potential spasticity   OT Frequency 2x / week   OT Duration 8 weeks   OT Treatment/Interventions Self-care/ADL training;Therapeutic exercise;Patient/family education;Balance training;Splinting;Manual Therapy;Neuromuscular education;Ultrasound;Iontophoresis;Therapeutic exercises;Therapeutic activities;DME and/or AE instruction;Parrafin;Cryotherapy;Electrical Stimulation;Fluidtherapy;Gait Training;Cognitive remediation/compensation;Visual/perceptual remediation/compensation;Passive range of motion;Contrast Bath;Moist Heat   Plan NMR, d/c tomorrow   Consulted and Agree with Plan of Care Patient        Problem List Patient Active Problem List   Diagnosis Date Noted  . OSA (obstructive sleep apnea) 10/11/2014  . Excessive daytime sleepiness 10/11/2014  . Hemiparesis ([facility]) 10/11/2014  . Dysarthria 10/11/2014  . Pneumoperitoneum 05/01/2013  . Lower GI bleed 05/01/2013  . UTI (urinary tract infection) 05/01/2013  . Supratherapeutic INR 05/01/2013  . Chronic indwelling Foley catheter 05/01/2013  . Jejunostomy tube present (?originally G tube?) 05/01/2013  . Laceration of eyebrow s/p repair 04/26/2013 05/01/2013  . 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 Hemiparesis affecting right side as late effect of stroke ([facility]) 05/01/2013  . 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 DVT of right axillary vein, acute 05/01/2013  . CVA (cerebral vascular accident) ([facility]) 05/01/2013  . Anemia 05/01/2013  . Dysphagia     RINE,KATHRYN 08/09/2015, 12:33 PM Keene BreathKathryn Rine, OTR/L [phone] Phone: [phone] 12:33 PM 08/09/2015 [facility] [address] [place], [place], [address] Phone: [phone]   Fax:  [phone]  Name: [name] NissenRobert [name] MRN: [id number] Date  of Birth: [birth year]

## 2015-08-10 ENCOUNTER — Encounter: Payer: Self-pay | Admitting: Rehabilitation

## 2015-08-10 ENCOUNTER — Ambulatory Visit: Payer: BLUE CROSS/BLUE SHIELD | Admitting: Rehabilitation

## 2015-08-10 ENCOUNTER — Ambulatory Visit: Payer: BLUE CROSS/BLUE SHIELD | Admitting: Occupational Therapy

## 2015-08-10 DIAGNOSIS — R278 Other lack of coordination: Secondary | ICD-10-CM

## 2015-08-10 DIAGNOSIS — R279 Unspecified lack of coordination: Secondary | ICD-10-CM

## 2015-08-10 DIAGNOSIS — R269 Unspecified abnormalities of gait and mobility: Secondary | ICD-10-CM

## 2015-08-10 DIAGNOSIS — IMO0002 Reserved for concepts with insufficient information to code with codable children: Secondary | ICD-10-CM

## 2015-08-10 DIAGNOSIS — M62838 Other muscle spasm: Secondary | ICD-10-CM

## 2015-08-10 DIAGNOSIS — I69359 Hemiplegia and hemiparesis following cerebral infarction affecting unspecified side: Secondary | ICD-10-CM

## 2015-08-10 DIAGNOSIS — R2681 Unsteadiness on feet: Secondary | ICD-10-CM

## 2015-08-10 DIAGNOSIS — I69319 Unspecified symptoms and signs involving cognitive functions following cerebral infarction: Secondary | ICD-10-CM

## 2015-08-10 DIAGNOSIS — R252 Cramp and spasm: Secondary | ICD-10-CM

## 2015-08-10 NOTE — Therapy (Signed)
[facility] [address] [place], [place], [address] Phone: [phone]   Fax:  [phone]  Occupational Therapy Treatment  Patient Details  Name: [name] [name] MRN: [id number] Date of Birth: 23-Feb-1956 Referring Provider: Dr. Jaynee Eagles  Encounter Date: 08/10/2015      OT End of Session - 08/10/15 1211    Visit Number 13   Number of Visits 17   Date for OT Re-Evaluation 08/10/15  extended due to missed visits   Authorization Type BCBS   Authorization Time Period 20 visit limit   Authorization - Visit Number 13   Authorization - Number of Visits 20   OT Start Time 1102   OT Stop Time 1145   OT Time Calculation (min) 43 min   Activity Tolerance Patient tolerated treatment well   Behavior During Therapy [facility] for tasks assessed/performed      Past Medical History  Diagnosis Date  . Dysphagia   . DVT (deep venous thrombosis) ([place])   . Hypertension   . Depression   . CVA (cerebral vascular accident) ([place]) 05/01/2013    left pontine infarct  . 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 Enlarged prostate     Past Surgical History  Procedure Laterality Date  . Esophagoscopy w/ percutaneous gastrostomy tube placement  04/12/2013    Dr Rinaldo Cloud, [facility]    There were no vitals filed for this visit.  Visit Diagnosis:  Hemiparesis affecting dominant side as late effect of cerebrovascular accident [facility])  Spasticity  Cognitive deficits following cerebral infarction  Decreased coordination      Subjective Assessment - 08/10/15 1127    Pertinent History see epic    Patient Stated Goals get back to "close to 100%"   Currently in Pain? No/denies         Treatment: Therapist cleaned splint and reviewed wear care and precautions with pt. He verbalized understanding/ returned demonstration . Pt was instructed not to wear splint until blister on pinky  heals. (Pt reports he was probably strapping splint too tight) Therapist chacked progress towards goals. Review of table slides and self P/ROM supination, wrist extension. Pt was instructed in how to incorporate right hand into folding towels/ washcloths. He returned demonstration. Pt agrees to d/c                       OT Short Term Goals - 08/10/15 1129    OT SHORT TERM GOAL #1   Title I with HEP.   Time 4   Period Weeks   Status Achieved   OT SHORT TERM GOAL #2   Title Pt will verbalize understanding of RUE positioning to minimize risk for injury, pain  and contracture.   Time 4   Period Weeks   Status Achieved   OT SHORT TERM GOAL #3   Title Pt will demonstrate A/ROM shoulder flexion of 60* in prep for functional reach.   Time 4   Period Weeks   Status Achieved   OT SHORT TERM GOAL #4   Title Pt will demonstrate ability to write his name with 70% legibility   Time 4   Period Weeks   Status Achieved           OT Long Term Goals - 08/10/15 1131    OT LONG TERM GOAL #1   Title Pt will demonstrate ability to perform light home management / cooking modified independently demonstrating  good safety awareness.   Baseline Therapist recommends pt doe microwave cooking for safety, not stove top, pt reports performing light home management but he has assist with heavier tasks   Status Partially Met   OT LONG TERM GOAL #2   Title Pt will report ability to use RUE as an active assist at least 50% of the time for light home management/ ADLs.   Baseline 10%   Status Not Met   OT LONG TERM GOAL #3   Title Pt will demonstrate improved RUE functional use as evidenced by performing 10 blocks on box/ blocks with RUE.   Baseline 13 blocks   Time 8   Period Weeks   Status Achieved   OT LONG TERM GOAL #4   Title Pt will demonstrate ability to write his name with 90% legibility   Baseline Pt demonstrates ability to write his name legibly using foam grip   Time 8    Period Weeks   Status Achieved               Plan - 08/10/15 1206    Clinical Impression Statement Pt agrees with plans for d/c. Pt has been educated regarding activities to perform at home to continue to use RUE functionally.   Pt will benefit from skilled therapeutic intervention in order to improve on the following deficits (Retired) Abnormal gait;Decreased coordination;Decreased range of motion;Difficulty walking;Impaired flexibility;Decreased endurance;Decreased safety awareness;Increased edema;Impaired sensation;Obesity;Decreased balance;Decreased knowledge of use of DME;Impaired UE functional use;Pain;Impaired perceived functional ability;Decreased strength;Decreased mobility;Decreased cognition;Impaired tone;Decreased activity tolerance;Decreased knowledge of precautions   Rehab Potential Fair   Clinical Impairments Affecting Rehab Potential spasticity   OT Frequency 2x / week   OT Duration 8 weeks   OT Treatment/Interventions Self-care/ADL training;Therapeutic exercise;Patient/family education;Balance training;Splinting;Manual Therapy;Neuromuscular education;Ultrasound;Iontophoresis;Therapeutic exercises;Therapeutic activities;DME and/or AE instruction;Parrafin;Cryotherapy;Electrical Stimulation;Fluidtherapy;Gait Training;Cognitive remediation/compensation;Visual/perceptual remediation/compensation;Passive range of motion;Contrast Bath;Moist Heat   Plan d/c    Consulted and Agree with Plan of Care Patient   Family Member Consulted brother        Problem List Patient Active Problem List   Diagnosis Date Noted  . OSA (obstructive sleep apnea) 10/11/2014  . Excessive daytime sleepiness 10/11/2014  . Hemiparesis ([place]) 10/11/2014  . Dysarthria 10/11/2014  . Pneumoperitoneum 05/01/2013  . Lower GI bleed 05/01/2013  . UTI (urinary tract infection) 05/01/2013  . Supratherapeutic INR 05/01/2013  . Chronic indwelling Foley catheter 05/01/2013  . Jejunostomy tube present  (?originally G tube?) 05/01/2013  . Laceration of eyebrow s/p repair 04/26/2013 05/01/2013  . 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 Hemiparesis affecting right side as late effect of stroke ([place]) 05/01/2013  . 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 DVT of right axillary vein, acute 05/01/2013  . CVA (cerebral vascular accident) ([place]) 05/01/2013  . Anemia 05/01/2013  . Dysphagia    OCCUPATIONAL THERAPY DISCHARGE SUMMARY    Current functional level related to goals / functional outcomes: Pt made progress towards goals yet he did not fully meet all goals due to cognitive deficits and spasticity.   Remaining deficits: Decreased strength, decreased coordination, cognitive deficits, spasticity, decreased balance   Education / Equipment: Pt was educated regarding: splint wear, care and precautions, and HEP. Therapist recommends that pt performs only microwave cooking for safety due to cognitive deficits. Pt verbalize understanding of all education. Pt is being d/c due to maximal progress achieved at this time.  Theone Murdoch, OTR/L [phone] Phone: [phone] 12:23 PM 08/10/2015 Plan: Patient agrees to discharge.  Patient  goals were partially met.                                                                                                      ????    [address] [place], [place], [address] Phone: [phone]   Fax:  [phone]  Name: [name] [name] MRN: [id number] Date of Birth: 1955-09-05

## 2015-08-10 NOTE — Therapy (Signed)
[facility] [address] [place], [place], [address] Phone: [phone]   Fax:  [phone]  Physical Therapy Treatment and D/C Summary  Patient Details  Name: [name] [name] MRN: [id number] Date of Birth: [birth year] Referring Provider: Sarina Ill, MD  Encounter Date: 08/10/2015      PT End of Session - 08/10/15 1135    Visit Number 13   Number of Visits 17   Date for PT Re-Evaluation 08/10/15  updated date as pt missed several visits and hasn't been seen since 12/9 for PT   Authorization Type [employer]; 20 visit limit for PT   Authorization - Visit Number 12   Authorization - Number of Visits 20   PT Start Time 1148   PT Stop Time 1240   PT Time Calculation (min) 52 min   Activity Tolerance Patient tolerated treatment well   Behavior During Therapy [facility] for tasks assessed/performed      Past Medical History  Diagnosis Date  . Dysphagia   . DVT (deep venous thrombosis) ([place])   . Hypertension   . Depression   . CVA (cerebral vascular accident) ([place]) 05/01/2013    left pontine infarct  . 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 Enlarged prostate     Past Surgical History  Procedure Laterality Date  . Esophagoscopy w/ percutaneous gastrostomy tube placement  04/12/2013    Dr Rinaldo Cloud, [facility]    There were no vitals filed for this visit.  Visit Diagnosis:  Abnormality of gait  Decreased coordination  Unsteadiness  Muscle spasticity  Weakness due to cerebrovascular accident      Subjective Assessment - 08/10/15 1134    Subjective Pt arrived with RW and B platforms.  Note marked improvement in postural control during gait with much less gait deviation than with SBQC.     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 Pt's brother's goals: to improve safety awareness, to increase pt safety going up stairs with cane.   Currently in Pain? No/denies          NMR:  Assessed BERG balance and TUG during session, see results below.  Pt partially meeting both goals, however feel that TUG goal not met due to now using B PFRW.    Gait:  Assessed gait on paved outdoor surfaces.  Pt able to ambulate up to 600' with B PFRW at mod I level.  Education to have S on grassy surfaces due to safety concerns.  Also performed curb/ramp with B PFRW.  Requires S for curb step negotiation, however was at mod I level for ramp negotiation.  Discussed that pt should be using his new ramp for entry and exit for home (discussed with pt and family).    Self Care:  Discussed fall prevention strategies and CVA warning signs and symptoms.  Pt able to state many on his own.  Provided pt and brother with handout for increased carryover.  Also discussed DC from therapy with pt and family.  Both verbalized understanding.                 [facility] Adult PT Treatment/Exercise - 08/10/15 1201    Standardized Balance Assessment   Standardized Balance Assessment Berg Balance Test;Timed Up and Go Test  Berg Balance Test   Sit to Stand Able to stand without using hands and stabilize independently   Standing Unsupported Able to stand safely 2 minutes   Sitting with Back Unsupported but Feet Supported on Floor or Stool Able to sit safely and securely 2 minutes   Stand to Sit Sits safely with minimal use of hands   Transfers Able to transfer safely, minor use of hands   Standing Unsupported with Eyes Closed Able to stand 10 seconds safely   Standing Ubsupported with Feet Together Able to place feet together independently and stand 1 minute safely   From Standing, Reach Forward with Outstretched Arm Can reach confidently >25 cm (10")   From Standing Position, Pick up Object from Floor Able  to pick up shoe safely and easily   From Standing Position, Turn to Look Behind Over each Shoulder Looks behind from both sides and weight shifts well   Turn 360 Degrees Able to turn 360 degrees safely but slowly   Standing Unsupported, Alternately Place Feet on Step/Stool Able to complete >2 steps/needs minimal assist   Standing Unsupported, One Foot in Front Able to plae foot ahead of the other independently and hold 30 seconds   Standing on One Leg Able to lift leg independently and hold 5-10 seconds   Total Score 49                PT Education - 08/10/15 1135    Education provided Yes   Education Details education on use of ramp to enter/exit home, use of B PFRW for improved efficiency of gait   Person(s) Educated Patient   Methods Explanation   Comprehension Verbalized understanding          PT Short Term Goals - 07/13/15 1309    PT SHORT TERM GOAL #1   Title Pt will perform initial HEP with mod I using paper handout to maximize functional gains made in PT. Target date:07/03/15   Baseline Needs min cues throughout to complete HEP.    Status Partially Met   PT SHORT TERM GOAL #2   Title Complete Berg and improve score by 5 points from baseline to indicate improved functional standing balance. Target date:07/03/15   Baseline 10/31: baseline Berg score = 42/56, 12/2: 46/56   Status Partially Met   PT SHORT TERM GOAL #3   Title Pt will decrease TUG from 25.10 sec to < 20 sec to indicate increased efficiency of mobility. Target date:07/03/15   Status Partially Met   PT SHORT TERM GOAL #4   Title Pt will increase gait velocity from 1.23 ft/sec to 1.53 ft/sec to indicate increased efficiency of ambulation. Target date:07/03/15   Status Partially Met   PT SHORT TERM GOAL #5   Title Pt will negotiate 4 stairs with 2 rails (either side to simulate home entrance) while safely managing SPC to indicate increased safety using primary entrance of home.  Target date:07/03/15    Baseline Needs continued cues for safety managing cane, otherwise does well with two handrails.    Status Partially Met   PT SHORT TERM GOAL #6   Title Pt will ambulate 200' over level, indoor surfaces with mod I with proper use of LRAD to indicate increased safety with household mobility, safe use of AD. Target date:07/03/15   Baseline Intermittent min cues for placing quad cane in complete contact with ground as he continues to keep it tilted.    Status Partially Met  PT Long Term Goals - 08/10/15 1137    PT LONG TERM GOAL #1   Title Pt will verbalize understanding of CVA warning signs, pertinent risk factors to prevent future CVA. Target date: 07/31/15   Baseline met 08/10/15   Status Achieved   PT LONG TERM GOAL #2   Title Pt will verbalize understanding of fall prevention strategies to decrease fall risk in home environment. Target date: 07/31/15   Baseline met 08/10/15   Status Achieved   PT LONG TERM GOAL #3   Title Pt will negotiate standard ramp and curb step with mod I using LRAD to increase pt safety/independence traversing community obstacles. Target date: 07/31/15   Baseline requires S for curb step, mod I ramp   Status Partially Met   PT LONG TERM GOAL #4   Title Pt will ambulate 500' over unlevel, paved surfaces with mod I using LRAD to indicate increased safety with limited community mobility. Target date: 07/31/15   Baseline met 08/10/15   Status Achieved   PT LONG TERM GOAL #5   Title Pt will increase gait velocity from 1.23 ft/sec to > / = 1.83 ft/sec to indicate decreased risk of recurrent falls. Target date: 07/31/15   Baseline 1.66 ft/sec on 08/10/15   Status Partially Met   PT LONG TERM GOAL #6   Title Pt will improve Berg score by 8 points from baseline to indicate significant improvement in functional standing balance. Target date: 07/31/15   Baseline 49/56 08/10/15   Status Partially Met   PT LONG TERM GOAL #7   Title Pt will decrease TUG time  from 25.10 sec to < 15 seconds to indicate decreased fall risk. Target date: 07/31/15   Baseline 34.47 seconds, using B platform  RW   Status Not Met               Plan - 08/10/15 1136    Clinical Impression Statement Skilled session focused on assessment of LTGs.  Pt has met 3/7 LTG's, partially meeting 3 more goals and did not meet 1 single goal.  Note that he is now using B PFRW and feel that this is why TUG had not improved, however postural control is markedly improved as well as demonstrating less gait compensatory strategies.  Recommend he use this at home.  Spoke with pts brother regarding D/C from therapy and for pt to work on things at home.  Pt at level to safely D/C and feel that further progress is limited by cognition.    Pt will benefit from skilled therapeutic intervention in order to improve on the following deficits Abnormal gait;Decreased cognition;Decreased knowledge of use of DME;Decreased coordination;Decreased mobility;Decreased endurance;Decreased activity tolerance;Decreased balance;Decreased range of motion;Decreased safety awareness;Impaired UE functional use;Decreased strength;Impaired tone;Impaired flexibility   Clinical Impairments Affecting Rehab Potential Transportation limitations (pt does not drive; brother works FT)   PT Frequency 2x / week   PT Duration 8 weeks   PT Treatment/Interventions DME Instruction;Electrical Stimulation;Gait training;Stair training;Functional mobility training;Therapeutic activities;Therapeutic exercise;Balance training;Patient/family education;Orthotic Fit/Training;Neuromuscular re-education;Manual techniques;Vestibular   PT Next Visit Plan n/a   Consulted and Agree with Plan of Care Patient        PHYSICAL THERAPY DISCHARGE SUMMARY  Visits from Start of Care: 13  Current functional level related to goals / functional outcomes: See LTG's above   Remaining deficits: Pt continues to have balance deficits and R spastic  hemiplegia.  He has made slow but steady gains in therapy and feel he is ready for D/C.  Also note limitation in progress due to cognition and poor carryover within session and between sessions.    Education / Equipment: HEP, education to use B PFRW and ramp to enter/exit home as well as remaining active in the home.   Plan: Patient agrees to discharge.  Patient goals were partially met. Patient is being discharged due to meeting the stated rehab goals.  ?????        Problem List Patient Active Problem List   Diagnosis Date Noted  . OSA (obstructive sleep apnea) 10/11/2014  . Excessive daytime sleepiness 10/11/2014  . Hemiparesis ([place]) 10/11/2014  . Dysarthria 10/11/2014  . Pneumoperitoneum 05/01/2013  . Lower GI bleed 05/01/2013  . UTI (urinary tract infection) 05/01/2013  . Supratherapeutic INR 05/01/2013  . Chronic indwelling Foley catheter 05/01/2013  . Jejunostomy tube present (?originally G tube?) 05/01/2013  . Laceration of eyebrow s/p repair 04/26/2013 05/01/2013  . 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 Hemiparesis affecting right side as late effect of stroke ([place]) 05/01/2013  . 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 DVT of right axillary vein, acute 05/01/2013  . CVA (cerebral vascular accident) ([place]) 05/01/2013  . Anemia 05/01/2013  . Dysphagia     Cameron Sprang, PT, MPT [facility] [address] [place], [place], [address] Phone: [phone]   Fax:  [phone] 08/10/2015, 12:54 PM  Name: [name] [name] MRN: [id number] Date of Birth: 03-26-1956

## 2015-08-10 NOTE — Patient Instructions (Addendum)
Practice folding washcloths and picking up small medicine bottles with your right hand each day. Use your right hand to pick up light, unbreakable objects . Don't carry anything breakable, sharp or hot in that hand. Practice writing each day. Stop wearing your splint until the blister on your pinky heals. If you develop other pressure areas stop wearing splint and show your family. They can help you determine if splint is strapped too tight. It is better to stop wearing splint if you have a question than to develop a sore or blister. You must continue to monitor skin closely for injury or pressure areas.

## 2015-08-21 NOTE — Telephone Encounter (Signed)
Patients brother stated that Dr. Lucia GaskinsAhern advised the patient to stop injections while he is receiving occupational therapy.

## 2016-02-11 ENCOUNTER — Other Ambulatory Visit: Payer: Self-pay | Admitting: Neurology

## 2016-06-18 ENCOUNTER — Ambulatory Visit (INDEPENDENT_AMBULATORY_CARE_PROVIDER_SITE_OTHER): Payer: Medicare Other | Admitting: Neurology

## 2016-06-18 ENCOUNTER — Encounter: Payer: Self-pay | Admitting: Neurology

## 2016-06-18 VITALS — BP 147/92 | HR 77 | Ht 71.0 in | Wt 193.0 lb

## 2016-06-18 DIAGNOSIS — G8110 Spastic hemiplegia affecting unspecified side: Secondary | ICD-10-CM

## 2016-06-18 NOTE — Progress Notes (Signed)
[facility]    Provider:  Dr Lucia GaskinsAhern Referring Provider: Renaye [name], Veita, MD Primary Care Physician:  [name] [name],[name] J, MD  CC:  Stroke  HPI:  [name] NissenRobert [name] is a 60 y.o. male here as a referral from Dr. Parke [name] for stroke. PMHx memory loss, stroke, hypertension 60 year old male with history of hypertension, history of DVT on Coumadin who [facility] with an acute stroke involving a large area on the left side with residual right hemiplegia and dysphasia was discharged to skilled nursing facility. His brother is here and provides most information, patient is a poor historian. Brother dsays patient had a clot that traveled through his lungs to the posterior circulation and casued the stroke. He had a PEG tube placed in the hospital due to dysphagia. He has no records with him. He was going to [facility] for yearly updates. He still has weakness and some dysarthria. He can barely walk without the cane. He has drooling which is worse. He is in physical therapy. Has been a year since the last speech therapy. He has some vision problems reading close up. He used to be a IT trainertrucker. He had sleep apnea and wore a cpap but hasn't had a sleep test in years and doesn't have a cpap anymore. He is having frequent awakenings at night for unknown reasons, He lives alone, unclear if snores and is excessively tired during the day. They found him in his truck, they think he was there in the truck for 2-3 days after having the stroke, patient couldn't move and couldn't get out of the truck. He has gained a lot of weight recently.  Update 03/14/2015 : Patient returns for follow-up after last visit with Dr. Lucia GaskinsAhern in March 2016. A helical CT of the basilar. Patient continues to have significant right arm weakness that is now able to walk with a cane. He has mild dysarthria and speech expressive difficulties. He is living alone but the brother lives nearby and lites some help. He has not had  any recurrent stroke or TIA symptoms. His blood pressure is under good control is 139/76 in the office. I have reviewed his prior stroke records from August 2014 [facility] care everywhere. Reviewed notes, labs and imaging from [facility]. 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 He has residual right-sided hemiparesis. He presented initially to an outside facility in August 2014 after being found down in his truck. CT of the head showed subacute left pontine infarct. NIH stroke scale of 30. 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 Patient was taken for a basilar thrombectomy. This did not reestablish flow. 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 He was started on anticoagulation due to the intra-arterial clot as well as a right upper extremity DVT. He has been continued on warfarin since then. INR goal 2-3. Last neurology follow-up visit in January 2016 had suggested stopping warfarin and checking hypercoagulable panel but this was not done  Update 06/19/2015 :  He returns for follow-up after last visit with me 3 months ago. He has seen Dr. Lucia GaskinsAhern and received Botox in his right arm and hand but states she did not notice significant improvement in his pain however improved range of motion. He is currently  doing outpatient therapy. He had follow-up carotid ultrasound done on 03/29/15 which showed no significant extracranial stenosis. He remains on Plavix which is tolerating well without significant bleeding or bruising. He is also tolerating Lipitor well without significant muscle aches or pains. He states his blood pressure is well controlled though it is slightly elevated at 149/90 now office. He had lipid profile checked  one half months ago and was fine. Heating a particular diet and not losing weight.    Update 06/18/2016 : He returns for follow-up after last visit a year ago. Patient continues to have significant spastic right hemiplegia with pain in his right shoulder but very limited motion. Is able to ambulate with a cane but with some spasticity in the right leg with some 6 fixed flexion deformity at the knee. Patient states he discontinued Botox as his insurance changed but now he feels like he wants to try it again. He is tolerating Plavix well with bruising and easy bleeding. States his blood pressure is usually borderline in today to slightly elevated. He is tolerating Lipitor well without muscle aches or pains. He admits to feeling depressed and is currently on Zoloft 25 mg a day. He has not had any recurrent stroke or TIA symptoms. He has not had follow-up carotid ultrasound done in more than a year.  Review of Systems: Patient complains of symptoms per HPI as well as the following symptoms:   weakness, gait difficulty, leg weakness., Runny nose, excessive sweating, eye redness, frequency of urination, joint pain, aching muscles, depression, memory loss, easy bruising and bleeding and all other systems negative Pertinent negatives per HPI. All others negative.   Social History   Social History  . Marital status: Married    Spouse name: N/A  . Number of children: 0  . Years of education: 12   Occupational History  . umemployed    Social History Main Topics  . Smoking status: Never Smoker  . Smokeless tobacco: Never Used  . Alcohol use No  . Drug use: No  . Sexual activity: Yes   Other Topics Concern  . Not on file   Social History Narrative   Lives at home by self.    Single.   Caffeine use: daily    Family History  Problem Relation Age of Onset  . Heart Problems Mother   . Stroke Father   . High blood pressure    . Diabetes    . Heart Problems      Past Medical History:    Diagnosis Date  . 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  . Depression   . DVT (deep venous thrombosis) ([facility])   . 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 Dysphagia   . Enlarged prostate   . 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 Hypertension     Past Surgical History:  Procedure Laterality Date  . ESOPHAGOSCOPY W/ PERCUTANEOUS GASTROSTOMY TUBE PLACEMENT  04/12/2013   Dr Murrell Converse, [facility]    Current Outpatient Prescriptions  Medication Sig Dispense Refill  . atorvastatin (LIPITOR) 40 MG tablet 40 mg by PEG Tube route daily.    . botulinum toxin Type A (BOTOX) 100 UNITS SOLR injection To be administered by provider in office to muscles every three months. 5 vial 0  . clopidogrel (PLAVIX) 75 MG tablet TAKE ONE TABLET BY MOUTH ONCE DAILY 30 tablet 5  . doxazosin (CARDURA) 1 MG tablet Take 1 tablet by mouth daily.  0  . QUEtiapine (SEROQUEL) 25 MG tablet Take 25 mg by mouth at bedtime.     . sertraline (ZOLOFT) 25 MG tablet Take 25 mg by mouth daily.      No current facility-administered medications for this visit.     Allergies as of 06/18/2016  . (No Known Allergies)    Vitals: BP (!) 147/92 (BP Location: Left Arm, Patient Position: Sitting, Cuff Size: Normal)   Pulse 77   Ht 5\' 11"  (1.803 m)   Wt 193 lb (87.5 kg)   BMI 26.92 kg/m  Last Weight:  Wt Readings from Last 1 Encounters:  06/18/16 193 lb (87.5 kg)   Last Height:   Ht Readings from Last 1 Encounters:  06/18/16 5\' 11"  (1.803 m)    Physical exam: Exam: Gen: NAD, well groomed  middle-aged male                   CV: RRR, no MRG. No Carotid Bruits. No peripheral edema, warm, nontender Eyes: Conjunctivae clear without exudates or hemorrhage Lungs clear to auscultation. Neuro: Awake alert oriented 3 Speech:    Speech is dysarthric;  Cognition:    The patient is oriented to person    recent and remote memory impaired;     Language not fluent;      Impaired attention, concentration,     fund of knowledge appears impaired Cranial Nerves:    The pupils are equal, round, and reactive to light. Cannot visualize fundi due to patient movements.  Visual fields are full to finger confrontation. Extraocular movements are intact. Trigeminal sensation is intact and the muscles of mastication are normal. The face is slightly asymmetric. The palate elevates in the midline. Hearing appears intact. Voice is normal. Shoulder shrug is normal. The tongue has normal motion without fasciculations.   Coordination:    Cannot perform on the right due to spasticity and weakness   Motor Observation:    no involuntary movements noted. Tone:    Spasticity right upper and lower extremity  Posture:    Posture is normal. normal erect    Strength:    Right spastic hemiparesis, right deltoid and hip flexion 2+/5 in upper extremities and 4/5 in the lower extremities except ankle dorsiflexor weakness with footdrop. Right shoulder movements limited due to pain     Sensation: intact to LT     Reflex Exam:  DTR's:    Brisk on the right Toes:    The toes are up on the right, down on the left    Clonus:    Clonus is absent. 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 Significant recidual spastic hemiplegia   I had a long d/w patient about his remote stroke, spastic hemiplegia, risk for recurrent stroke/TIAs, personally independently reviewed imaging studies and stroke evaluation results and answered questions.Continue Plavix  for secondary stroke prevention and maintain strict control of hypertension with blood pressure goal below 130/90, diabetes with hemoglobin A1c goal below 6.5% and lipids with LDL cholesterol  goal below 70 mg/dL. I also advised the patient to eat a healthy diet with plenty of whole grains, cereals, fruits and vegetables, exercise regularly and maintain ideal body weight .they will consider repeat Botox for post stroke spasticity if his insurance  will permit it. Check follow-up carotid ultrasound study. Continue Zoloft for depression and follow-up with his primary care physician for the same Greater than 50% time during this 25 minute visit was spent on counseling and coordination of care about his stroke, post stroke spasticity and answering questions Followup in the future with me in one year or call earlier if necessary Delia HeadyPramod Sethi, MD  [facility] [address] [place], [place] [id number]  Phone [phone] Fax [phone]

## 2016-06-24 ENCOUNTER — Telehealth: Payer: Self-pay | Admitting: Neurology

## 2016-06-24 NOTE — Telephone Encounter (Signed)
Patient ready to be scheduled for Doppler. NO PA needed. Thanks Shannon.  °

## 2016-07-10 ENCOUNTER — Ambulatory Visit (INDEPENDENT_AMBULATORY_CARE_PROVIDER_SITE_OTHER): Payer: Medicare Other

## 2016-07-10 DIAGNOSIS — G8110 Spastic hemiplegia affecting unspecified side: Secondary | ICD-10-CM

## 2016-07-29 ENCOUNTER — Telehealth: Payer: Self-pay

## 2016-07-29 NOTE — Telephone Encounter (Signed)
Dr. Pearlean BrownieSethi reviewed the carotid doppler study. The study was negative,and normal.

## 2016-07-30 NOTE — Telephone Encounter (Signed)
LFt vm for patient to call back about carotid doppler results.

## 2016-07-31 NOTE — Telephone Encounter (Signed)
Advised patient per previous message of negative and normal doppler study.

## 2016-09-01 ENCOUNTER — Other Ambulatory Visit: Payer: Self-pay | Admitting: Neurology

## 2017-06-18 ENCOUNTER — Encounter (INDEPENDENT_AMBULATORY_CARE_PROVIDER_SITE_OTHER): Payer: Self-pay

## 2017-06-18 ENCOUNTER — Encounter: Payer: Self-pay | Admitting: Neurology

## 2017-06-18 ENCOUNTER — Ambulatory Visit: Payer: Medicare Other | Admitting: Neurology

## 2017-06-18 VITALS — BP 165/91 | HR 70 | Wt 216.6 lb

## 2017-06-18 DIAGNOSIS — I651 Occlusion and stenosis of basilar artery: Secondary | ICD-10-CM | POA: Diagnosis not present

## 2017-06-18 DIAGNOSIS — G8110 Spastic hemiplegia affecting unspecified side: Secondary | ICD-10-CM | POA: Diagnosis not present

## 2017-06-18 NOTE — Progress Notes (Signed)
[name]    Provider:  Dr Lucia GaskinsAhern Referring Provider: Renaye [name], Veita, MD Primary Care Physician:  [name] [name], Veita, MD  CC:  Stroke  HPI:  [name] NissenRobert [name] is a 61 y.o. male here as a referral from Dr. Parke [name] for stroke. PMHx memory loss, stroke, hypertension 61 year old male with history of hypertension, history of DVT on Coumadin who was [facility] with an acute stroke involving a large area on the left side with residual right hemiplegia and dysphasia was discharged to skilled nursing facility. His brother is here and provides most information, patient is a poor historian. Brother dsays patient had a clot that traveled through his lungs to the posterior circulation and casued the stroke. He had a PEG tube placed in the hospital due to dysphagia. He has no records with him. He was going to [facility] for yearly updates. He still has weakness and some dysarthria. He can barely walk without the cane. He has drooling which is worse. He is in physical therapy. Has been a year since the last speech therapy. He has some vision problems reading close up. He used to be a IT trainertrucker. He had sleep apnea and wore a cpap but hasn't had a sleep test in years and doesn't have a cpap anymore. He is having frequent awakenings at night for unknown reasons, He lives alone, unclear if snores and is excessively tired during the day. They found him in his truck, they think he was there in the truck for 2-3 days after having the stroke, patient couldn't move and couldn't get out of the truck. He has gained a lot of weight recently.  Update 03/14/2015 : Patient returns for follow-up after last visit with Dr. Lucia GaskinsAhern in March 2016. A helical CT of the basilar. Patient continues to have significant right arm weakness that is now able to walk with a cane. He has mild dysarthria and speech expressive difficulties. He is living alone but the brother lives nearby and lites some help. He has not had  any recurrent stroke or TIA symptoms. His blood pressure is under good control is 139/76 in the office. I have reviewed his prior stroke records from August 2014 [facility] care everywhere. Reviewed notes, labs and imaging from [facility]. 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 He has residual right-sided hemiparesis. He presented initially to an outside facility in August 2014 after being found down in his truck. CT of the head showed subacute left pontine infarct. NIH stroke scale of 30. 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 Patient was taken for a basilar thrombectomy. This did not reestablish flow. 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 He was started on anticoagulation due to the intra-arterial clot as well as a right upper extremity DVT. He has been continued on warfarin since then. INR goal 2-3. Last neurology follow-up visit in January 2016 had suggested stopping warfarin and checking hypercoagulable panel but this was not done  Update 06/19/2015 :  He returns for follow-up after last visit with me 3 months ago. He has seen Dr. Lucia GaskinsAhern and received Botox in his right arm and hand but states she did not notice significant improvement in his pain however improved range of motion. He is currently  doing outpatient therapy. He had follow-up carotid ultrasound done on 03/29/15 which showed no significant extracranial stenosis. He remains on Plavix which is tolerating well without significant bleeding or bruising. He is also tolerating Lipitor well without significant muscle aches or pains. He states his blood pressure is well controlled though it is slightly elevated at 149/90 now office. He had lipid profile checked  one half months ago and was fine. Heating a particular diet and not losing weight.    Update 06/18/2016 : He returns for follow-up after last visit a year ago. Patient continues to have significant spastic right hemiplegia with pain in his right shoulder but very limited motion. Is able to ambulate with a cane but with some spasticity in the right leg with some 6 fixed flexion deformity at the knee. Patient states he discontinued Botox as his insurance changed but now he feels like he wants to try it again. He is tolerating Plavix well with bruising and easy bleeding. States his blood pressure is usually borderline in today to slightly elevated. He is tolerating Lipitor well without muscle aches or pains. He admits to feeling depressed and is currently on Zoloft 25 mg a day. He has not had any recurrent stroke or TIA symptoms. He has not had follow-up carotid ultrasound done in more than a year. Update 06/18/2017 ; he returns for follow-up after last visit a year ago.  He continues to have significant spastic hemiplegia and right-sided weakness.  He states his condition is about the same.  He cannot elevate his shoulder above a certain degree and has to use his left hand to raise it above.  He cannot walk very fast and he blames this on his stiff right leg.  He has been wearing a AFO.  He has not tried Botox for spasticity in his right leg.  He did try Botox once for his shoulder but it did not help him.  He is tolerating Plavix well without significant bleeding but does agree to easy bruising.  States his blood pressure is well controlled though it is elevated today at 1 6 5/9 118 rechecked it myself it was 1 4 0/9 0.  He is tolerating Lipitor well without muscle aches and pains.  He cannot tell me when his lipid profile was last checked.  He is also not had follow-up Doppler studies checked for more than a year.  He has no other new complaints today. Review of Systems: Patient complains of symptoms per HPI as  well as the following symptoms:   weakness, gait difficulty, leg weakness.,  Frequency of urination, easy bruising and bleeding, memory loss, back pain, hallucinations, insomnia, snoring, skin moles all other systems negative.   Social History   Socioeconomic History  . Marital status: Married    Spouse name: Not on file  . Number of children: 0  . Years of education: 25  . Highest education level: Not on file  Social Needs  . Financial resource strain: Not on file  . Food insecurity - worry: Not on file  . Food insecurity - inability: Not on file  . Transportation needs - medical: Not on file  . Transportation needs - non-medical: Not on file  Occupational History  . Occupation: umemployed  Tobacco Use  . Smoking status: Never Smoker  . Smokeless tobacco: Never Used  Substance and Sexual Activity  . Alcohol use: No  . Drug use: No  . Sexual activity: Yes  Other Topics Concern  . Not on  file  Social History Narrative   Lives at home by self.    Single.   Caffeine use: daily    Family History  Problem Relation Age of Onset  . Heart Problems Mother   . Stroke Father   . High blood pressure Unknown   . Diabetes Unknown   . Heart Problems Unknown     Past Medical History:  Diagnosis Date  . 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  . Depression   . DVT (deep venous thrombosis) (HCC)   . 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 Dysphagia   . Enlarged prostate   . 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 Hypertension     Past Surgical History:  Procedure Laterality Date  . ESOPHAGOSCOPY W/ PERCUTANEOUS GASTROSTOMY TUBE PLACEMENT  04/12/2013   Dr Murrell [facility]verse[facility]ildreth[facility]    Current Outpatient Medications  Medication Sig Dispense Refill  . atorvastatin (LIPITOR) 40 MG tablet 40 mg by PEG Tube route daily.    . clopidogrel (PLAVIX) 75 MG tablet TAKE ONE TABLET BY MOUTH ONCE DAILY 90 tablet 3  . doxazosin (CARDURA) 1 MG  tablet Take 1 tablet by mouth daily.  0  . finasteride (PROSCAR) 5 MG tablet 5 mg.    [name]. QUEtiapine (SEROQUEL) 25 MG tablet Take 25 mg by mouth at bedtime.     . sertraline (ZOLOFT) 25 MG tablet Take 25 mg by mouth daily.      No current facility-administered medications for this visit.     Allergies as of 06/18/2017  . (No Known Allergies)    Vitals: BP (!) 165/91 (Patient Position: Sitting, Cuff Size: Normal)   Pulse 70   Wt 216 lb 9.6 oz (98.2 kg)   BMI 30.21 kg/m  Last Weight:  Wt Readings from Last 1 Encounters:  06/18/17 216 lb 9.6 oz (98.2 kg)   Last Height:   Ht Readings from Last 1 Encounters:  06/18/16 5\' 11"  (1.803 m)    Physical exam: Exam: Gen: NAD, well groomed  middle-aged male                   CV: RRR, no MRG. No Carotid Bruits. No peripheral edema, warm, nontender Eyes: Conjunctivae clear without exudates or hemorrhage Lungs clear to auscultation. Neuro: Awake alert oriented 3 Speech:    Speech is dysarthric;  Cognition:    The patient is oriented to person    recent and remote memory impaired;     Language not fluent;     Impaired attention, concentration,     fund of knowledge appears impaired Cranial Nerves:    The pupils are equal, round, and reactive to light. Cannot visualize fundi due to patient movements.  Visual fields are full to finger confrontation. Extraocular movements are intact. Trigeminal sensation is intact and the muscles of mastication are normal. The face is slightly asymmetric. The palate elevates in the midline. Hearing appears intact. Voice is normal. Shoulder shrug is normal. The tongue has normal motion without fasciculations.   Coordination:    Cannot perform on the right due to spasticity and weakness   Motor Observation:    no involuntary movements noted. Tone:    Spasticity right upper and lower extremity  Posture:    Posture is normal. normal erect    Strength:    Right spastic hemiparesis, right deltoid and  hip flexion 2+/5 in upper extremities and 4/5 in the lower extremities except ankle dorsiflexor  weakness with footdrop. Right shoulder movements limited due to pain     Sensation: intact to LT     Reflex Exam:  DTR's:    Brisk on the right Toes:    The toes are up on the right, down on the left    Clonus:    Clonus is absent. Gait : Spastic hemiplegic gait with circumduction and right foot drop and uses a cane      Assessment/Plan:  I had a long d/w patient about his remote stroke, spastic right hemiparesis,risk for recurrent stroke/TIAs, personally independently reviewed imaging studies and stroke evaluation results and answered questions.Continue Plavix 75 mg daily  for secondary stroke prevention and maintain strict control of hypertension with blood pressure goal below 130/90, diabetes with hemoglobin A1c goal below 6.5% and lipids with LDL cholesterol goal below 70 mg/dL. I also advised the patient to eat a healthy diet with plenty of whole grains, cereals, fruits and vegetables, exercise regularly and maintain ideal body weight.  I will refer him to Dr. Terrace Arabia for Botox into his spastic right leg to improve his walking.  Also consider a walk aid device to help with ambulation.  Check lipid profile hemoglobin A1c, carotid ultrasound and transcranial Doppler studies.  Followup in the future with me in only as necessary and no routine appointment was made.Greater than 50% time during this 25 minute visit was spent on counseling and coordination of care about his stroke, post stroke spasticity and answering questions   Delia Heady, MD  [facility] [address] [place], [place] [address]  Phone [phone] Fax [phone]

## 2017-06-18 NOTE — Patient Instructions (Addendum)
I had a long d/w patient about his remote stroke, spastic right hemiparesis,risk for recurrent stroke/TIAs, personally independently reviewed imaging studies and stroke evaluation results and answered questions.Continue Plavix 75 mg daily  for secondary stroke prevention and maintain strict control of hypertension with blood pressure goal below 130/90, diabetes with hemoglobin A1c goal below 6.5% and lipids with LDL cholesterol goal below 70 mg/dL. I also advised the patient to eat a healthy diet with plenty of whole grains, cereals, fruits and vegetables, exercise regularly and maintain ideal body weight.  I will refer him to Dr. Terrace ArabiaYan for Botox into his spastic right leg to improve his walking.  Also consider a walk aid device to help with ambulation.  Check lipid profile hemoglobin A1c, carotid ultrasound and transcranial Doppler studies.  Followup in the future with me in only as necessary and no routine appointment was made.

## 2017-06-19 LAB — LIPID PANEL
CHOLESTEROL TOTAL: 97 mg/dL — AB (ref 100–199)
Chol/HDL Ratio: 2.5 ratio (ref 0.0–5.0)
HDL: 39 mg/dL — AB (ref >39)
LDL Calculated: 50 mg/dL (ref 0–99)
Triglycerides: 41 mg/dL (ref 0–149)
VLDL CHOLESTEROL CAL: 8 mg/dL (ref 5–40)

## 2017-06-19 LAB — HEMOGLOBIN A1C
ESTIMATED AVERAGE GLUCOSE: 97 mg/dL
Hgb A1c MFr Bld: 5 % (ref 4.8–5.6)

## 2017-06-26 ENCOUNTER — Ambulatory Visit (HOSPITAL_BASED_OUTPATIENT_CLINIC_OR_DEPARTMENT_OTHER)
Admission: RE | Admit: 2017-06-26 | Discharge: 2017-06-26 | Disposition: A | Discharge status: Home / Self Care | Payer: Medicare Other | Source: Ambulatory Visit | Attending: Neurology | Admitting: Neurology

## 2017-06-26 ENCOUNTER — Ambulatory Visit (HOSPITAL_COMMUNITY)
Admission: RE | Admit: 2017-06-26 | Discharge: 2017-06-26 | Disposition: A | Discharge status: Home / Self Care | Payer: Medicare Other | Source: Ambulatory Visit | Attending: Neurology | Admitting: Neurology

## 2017-06-26 DIAGNOSIS — I6523 Occlusion and stenosis of bilateral carotid arteries: Secondary | ICD-10-CM | POA: Diagnosis not present

## 2017-06-26 DIAGNOSIS — I651 Occlusion and stenosis of basilar artery: Secondary | ICD-10-CM

## 2017-06-26 NOTE — Progress Notes (Signed)
*  PRELIMINARY RESULTS* Vascular Ultrasound Transcranial Doppler has been completed.    [name] FischerCharlotte C Janellie [name] 06/26/2017, 2:46 PM

## 2017-06-26 NOTE — Progress Notes (Signed)
*  PRELIMINARY RESULTS* Vascular Ultrasound Carotid Duplex (Doppler) has been completed.  Preliminary findings: Bilateral 1-39% ICA stenosis, antegrade vertebral flow.   [name] FischerCharlotte C Binyamin [name] 06/26/2017, 2:49 PM

## 2017-06-29 ENCOUNTER — Telehealth: Payer: Self-pay

## 2017-06-29 NOTE — Telephone Encounter (Signed)
Left vm for patient to call back about carotid ultrasound results. ------

## 2017-06-29 NOTE — Telephone Encounter (Signed)
Receive incoming call from patient. Carotid ultrasound was unremarkable. Pt verbalized understanding. ------

## 2017-06-29 NOTE — Telephone Encounter (Signed)
Receive incoming call from patient. Carotid ultrasound was unremarkable. Pt verbalized understanding. ------ 

## 2017-06-29 NOTE — Telephone Encounter (Signed)
Left vm for patient to call back about lipid panel results.

## 2017-06-29 NOTE — Telephone Encounter (Signed)
-----   Message from Micki RileyPramod S Sethi, MD sent at 06/26/2017  6:15 PM EST ----- Kindly inform the patient that her cholesterol and test for blood sugars were both satisfactory

## 2017-07-07 ENCOUNTER — Telehealth: Payer: Self-pay

## 2017-07-07 NOTE — Telephone Encounter (Signed)
Unable to leave vm for patient to call back. The vm stated it was not set up. ------

## 2017-07-07 NOTE — Telephone Encounter (Signed)
-----   Message from Micki RileyPramod S Sethi, MD sent at 07/06/2017  4:15 PM EST ----- Kindly inform the patient that trans-cranial Doppler study showed no significant blockages to worry about

## 2017-07-08 NOTE — Telephone Encounter (Signed)
If patient calls back tell him the transcranial doppler study showed no significant blockages to worry about. Continue to follow up with his MD,and continue treatment plan.  Left vm for patient to call back about transcranial doppler study results

## 2017-07-08 NOTE — Telephone Encounter (Signed)
-----   Message from Pramod S Sethi, MD sent at 07/06/2017  4:15 PM EST ----- Kindly inform the patient that trans-cranial Doppler study showed no significant blockages to worry about 

## 2017-07-09 NOTE — Telephone Encounter (Signed)
Rn call patient that the transcranial doppler study showed no significant blockages to worry about. Pt verbalized understanding.

## 2017-08-11 ENCOUNTER — Other Ambulatory Visit: Payer: Self-pay | Admitting: Neurology

## 2017-08-12 ENCOUNTER — Encounter: Payer: Self-pay | Admitting: Neurology

## 2017-08-12 ENCOUNTER — Ambulatory Visit: Payer: Medicare Other | Admitting: Neurology

## 2017-08-12 ENCOUNTER — Telehealth: Payer: Self-pay | Admitting: Neurology

## 2017-08-12 VITALS — BP 158/78 | HR 66 | Ht 71.0 in | Wt 220.0 lb

## 2017-08-12 DIAGNOSIS — I69351 Hemiplegia and hemiparesis following cerebral infarction affecting right dominant side: Secondary | ICD-10-CM | POA: Diagnosis not present

## 2017-08-12 NOTE — Telephone Encounter (Signed)
Patient needs 4 week BOTOX appt.

## 2017-08-12 NOTE — Progress Notes (Signed)
PATIENT: [name] [name] DOB: 01/04/1956  Chief Complaint  Patient presents with  . Spastic Hemiparesis    He is here to discuss treatment with Botox (referred by Dr. Pearlean Brownie).     HISTORICAL  [name] [name] is a 62 years old male, seen in refer by Dr. Pearlean Brownie for evaluation of EMG guided botulinum toxin injection for spastic right hemiparesis, initial evaluation was on August 12, 2017.  His primary care physician is Dr. 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  Now he is taking Plavix 75 mg daily  He received his only Botox injections in 2016 by Dr. Lucia Gaskins in his right arm and hand, relax his muscles some, but lost follow-up,  REVIEW OF SYSTEMS: Full 14 system review of systems performed and notable only for frequent urination, memory loss, bruise easily, joint pain, back pain, achy muscles, walking difficulty, confusion, apnea, snoring, fatigue  ALLERGIES: No Known Allergies  HOME MEDICATIONS: Current Outpatient Medications  Medication Sig Dispense Refill  . atorvastatin (LIPITOR) 40 MG tablet 40 mg by PEG Tube route daily.    . clopidogrel (PLAVIX) 75 MG tablet TAKE ONE  TABLET BY MOUTH ONCE DAILY 90 tablet 3  . doxazosin (CARDURA) 1 MG tablet Take 1 tablet by mouth daily.  0  . finasteride (PROSCAR) 5 MG tablet 5 mg.    [name] QUEtiapine (SEROQUEL) 25 MG tablet Take 25 mg by mouth at bedtime.     . sertraline (ZOLOFT) 25 MG tablet Take 25 mg by mouth daily.      No current facility-administered medications for this visit.     PAST MEDICAL HISTORY: Past Medical History:  Diagnosis Date  . 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  . Depression   . DVT (deep venous thrombosis) (HCC)   . 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 Dysphagia   . Enlarged prostate   . 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 Hypertension     PAST SURGICAL HISTORY: Past Surgical History:  Procedure Laterality Date  . ESOPHAGOSCOPY W/ PERCUTANEOUS GASTROSTOMY TUBE PLACEMENT  04/12/2013   Dr Murrell Converse, [facility]    FAMILY HISTORY: Family History  Problem Relation Age of Onset  . Heart Problems Mother   . Stroke Father   . High blood pressure Unknown   . Diabetes Unknown   . Heart Problems Unknown     SOCIAL HISTORY:  Social History   Socioeconomic History  . Marital status: Single    Spouse name: Not on file  . Number of children: 0  . Years of education: 69  . Highest education level: Not on file  Social Needs  . Financial resource strain: Not on file  .  Food insecurity - worry: Not on file  . Food insecurity - inability: Not on file  . Transportation needs - medical: Not on file  . Transportation needs - non-medical: Not on file  Occupational History  . Occupation: umemployed  Tobacco Use  . Smoking status: Never Smoker  . Smokeless tobacco: Never Used  Substance and Sexual Activity  . Alcohol use: No  . Drug use: No  . Sexual activity: Yes  Other Topics Concern  . Not on file  Social History Narrative   Lives at home by self.    Single.   Caffeine use: daily     PHYSICAL EXAM   Vitals:    08/12/17 1428  BP: (!) 158/78  Pulse: 66  Weight: 220 lb (99.8 kg)  Height: 5\' 11"  (1.803 m)    Not recorded      Body mass index is 30.68 kg/m.  PHYSICAL EXAMNIATION:  Gen: NAD, conversant, well nourised, obese, well groomed                     Cardiovascular: Regular rate rhythm, no peripheral edema, warm, nontender. Eyes: Conjunctivae clear without exudates or hemorrhage Neck: Supple, no carotid bruits. Pulmonary: Clear to auscultation bilaterally   NEUROLOGICAL EXAM:  MENTAL STATUS: Speech:    Speech is normal; fluent and spontaneous with normal comprehension.  Cognition:     Orientation to time, place and person     Normal recent and remote memory     Normal Attention span and concentration     Normal Language, naming, repeating,spontaneous speech     Fund of knowledge   CRANIAL NERVES: CN II: Visual fields are full to confrontation. Fundoscopic exam is normal with sharp discs and no vascular changes. Pupils are round equal and briskly reactive to light. CN III, IV, VI: extraocular movement are normal. No ptosis. CN V: Facial sensation is intact to pinprick in all 3 divisions bilaterally. Corneal responses are intact.  CN VII: Face is symmetric with normal eye closure and smile. CN VIII: Hearing is normal to rubbing fingers CN IX, X: Palate elevates symmetrically. Phonation is normal. CN XI: Head turning and shoulder shrug are intact CN XII: Tongue is midline with normal movements and no atrophy.  MOTOR: Spastic right hemiparesis, upper extremity proximal muscles 4 -, distal muscles 4 -, significant right lower extremity muscle spasm, right lower extremity proximal distal muscle strength 3  REFLEXES: Reflexia on the right upper and lower extremity muscles, plantar responses were extensor on the right side  SENSORY: Intact to light touch, pinprick, positional sensation and vibratory sensation are intact in fingers and toes.  COORDINATION: Rapid alternating  movements and fine finger movements are intact. There is no dysmetria on finger-to-nose and heel-knee-shin.    GAIT/STANCE: He needs pushed up to get up from seated position, dragging his right arm and right leg   DIAGNOSTIC DATA (LABS, IMAGING, TESTING) - I reviewed patient records, labs, notes, testing and imaging myself where available.   ASSESSMENT AND PLAN  [name] [name] is a 62 y.o. male   Spastic right hemiparesis due to large left pontine stroke in 2014  He would benefit EMG guided botulinum toxin injection, ask for xeomin 600 units.  Return to clinic in one month for injection  Levert FeinsteinYijun Moselle Rister, M.D. Ph.D.  [facility] [address], [address] [place], [place] [address] Ph: [phone]) [phone] Fax: [phone]  CC: Referring Provider

## 2017-08-18 NOTE — Telephone Encounter (Signed)
I called to schedule the patient but he did not answer so I left a VM asking him to call me back.  °

## 2017-08-18 NOTE — Telephone Encounter (Signed)
Pt is returning call.  

## 2017-09-29 ENCOUNTER — Encounter: Payer: Self-pay | Admitting: *Deleted

## 2017-09-29 ENCOUNTER — Encounter: Payer: Self-pay | Admitting: Neurology

## 2017-09-29 ENCOUNTER — Ambulatory Visit: Payer: Medicare Other | Admitting: Neurology

## 2017-09-29 VITALS — BP 148/90 | HR 75 | Ht 71.0 in | Wt 220.0 lb

## 2017-09-29 DIAGNOSIS — I69351 Hemiplegia and hemiparesis following cerebral infarction affecting right dominant side: Secondary | ICD-10-CM

## 2017-09-29 DIAGNOSIS — G8111 Spastic hemiplegia affecting right dominant side: Secondary | ICD-10-CM | POA: Insufficient documentation

## 2017-09-29 MED ORDER — INCOBOTULINUMTOXINA 100 UNITS IM SOLR
600.0000 [IU] | INTRAMUSCULAR | Status: DC
  Administered 2017-09-29: 600 [IU] via INTRAMUSCULAR

## 2017-09-29 NOTE — Progress Notes (Signed)
PATIENT: [name] [name] DOB: [birth year]  Chief Complaint  Patient presents with  . Right hemiparesis    Xeomin 100 units x 6 vials - office supply     HISTORICAL  [name] [name] is a 62 years old male, seen in refer by Dr. Pearlean Brownie for evaluation of EMG guided botulinum toxin injection for spastic right hemiparesis, initial evaluation was on August 12, 2017.  His primary care physician is Dr. 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  Now he is taking Plavix 75 mg daily  He received his only Botox injections in 2016 by Dr. Lucia Gaskins in his right arm and hand, relax his muscles some, but lost follow-up,  UPDATE Sep 29 2017: This is his first electrical stimulation guided xeomin injection, we used 600 units of xeomin   REVIEW OF SYSTEMS: Full 14 system review of systems performed and notable only for frequent urination, memory loss, bruise easily, joint pain, back pain, achy muscles, walking difficulty, confusion, apnea, snoring, fatigue  ALLERGIES: No Known Allergies  HOME MEDICATIONS: Current Outpatient Medications  Medication Sig Dispense Refill  . atorvastatin (LIPITOR) 40 MG  tablet 40 mg by PEG Tube route daily.    . clopidogrel (PLAVIX) 75 MG tablet TAKE ONE TABLET BY MOUTH ONCE DAILY 90 tablet 3  . doxazosin (CARDURA) 1 MG tablet Take 1 tablet by mouth daily.  0  . finasteride (PROSCAR) 5 MG tablet 5 mg.    [name] QUEtiapine (SEROQUEL) 25 MG tablet Take 25 mg by mouth at bedtime.     . sertraline (ZOLOFT) 25 MG tablet Take 25 mg by mouth daily.      No current facility-administered medications for this visit.     PAST MEDICAL HISTORY: Past Medical History:  Diagnosis Date  . 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  . Depression   . DVT (deep venous thrombosis) (HCC)   . 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 Dysphagia   . Enlarged prostate   . 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 Hypertension     PAST SURGICAL HISTORY: Past Surgical History:  Procedure Laterality Date  . ESOPHAGOSCOPY W/ PERCUTANEOUS GASTROSTOMY TUBE PLACEMENT  04/12/2013   Dr Murrell Converse, [facility]    FAMILY HISTORY: Family History  Problem Relation Age of Onset  . Heart Problems Mother   . Stroke Father   . High blood pressure Unknown   . Diabetes Unknown   . Heart Problems Unknown     SOCIAL HISTORY:  Social History   Socioeconomic History  . Marital status: Single    Spouse name: Not on file  . Number of children: 0  . Years of education: 18  .  Highest education level: Not on file  Social Needs  . Financial resource strain: Not on file  . Food insecurity - worry: Not on file  . Food insecurity - inability: Not on file  . Transportation needs - medical: Not on file  . Transportation needs - non-medical: Not on file  Occupational History  . Occupation: umemployed  Tobacco Use  . Smoking status: Never Smoker  . Smokeless tobacco: Never Used  Substance and Sexual Activity  . Alcohol use: No  . Drug use: No  . Sexual activity: Yes  Other Topics Concern  . Not on file  Social History Narrative   Lives at  home by self.    Single.   Caffeine use: daily     PHYSICAL EXAM   Vitals:   09/29/17 1327  BP: (!) 148/90  Pulse: 75  Weight: 220 lb (99.8 kg)  Height: 5\' 11"  (1.803 m)    Not recorded      Body mass index is 30.68 kg/m.  PHYSICAL EXAMNIATION:  Gen: NAD, conversant, well nourised, obese, well groomed                     Cardiovascular: Regular rate rhythm, no peripheral edema, warm, nontender. Eyes: Conjunctivae clear without exudates or hemorrhage Neck: Supple, no carotid bruits. Pulmonary: Clear to auscultation bilaterally   NEUROLOGICAL EXAM:  MENTAL STATUS: Speech:    Speech is normal; fluent and spontaneous with normal comprehension.  Cognition:     Orientation to time, place and person     Normal recent and remote memory     Normal Attention span and concentration     Normal Language, naming, repeating,spontaneous speech     Fund of knowledge   CRANIAL NERVES: CN II: Visual fields are full to confrontation. Fundoscopic exam is normal with sharp discs and no vascular changes. Pupils are round equal and briskly reactive to light. CN III, IV, VI: extraocular movement are normal. No ptosis. CN V: Facial sensation is intact to pinprick in all 3 divisions bilaterally. Corneal responses are intact.  CN VII: Face is symmetric with normal eye closure and smile. CN VIII: Hearing is normal to rubbing fingers CN IX, X: Palate elevates symmetrically. Phonation is normal. CN XI: Head turning and shoulder shrug are intact CN XII: Tongue is midline with normal movements and no atrophy.  MOTOR: Spastic right hemiparesis, upper extremity proximal muscles 4 -, distal muscles 4 -, significant right lower extremity muscle spasm, right lower extremity proximal distal muscle strength 3  REFLEXES: Reflexia on the right upper and lower extremity muscles, plantar responses were extensor on the right side  SENSORY: Intact to light touch, pinprick, positional sensation and  vibratory sensation are intact in fingers and toes.  COORDINATION: Rapid alternating movements and fine finger movements are intact. There is no dysmetria on finger-to-nose and heel-knee-shin.    GAIT/STANCE: He needs pushed up to get up from seated position, dragging his right arm and right leg   DIAGNOSTIC DATA (LABS, IMAGING, TESTING) - I reviewed patient records, labs, notes, testing and imaging myself where available.   ASSESSMENT AND PLAN  Dock Baccam is a 62 y.o. male   Spastic right hemiparesis due to large left pontine stroke in 2014  We used 600 units of xeomin, under electrical stimulation  Right pronator teres 50 units Right biceps 50 units Right brachials 50 units  Right flexor carpi ulnaris 50 units  Right flexor digitorum profundus 50 units Right flexor digitorum superficialis  50 units Right pectoralis major 50 units Right latissimus dorsi 50 units  Right tibialis anterior 100 units Right flexor digitorum longus 100 units  Levert FeinsteinYijun Khalib Fendley, M.D. Ph.D.  [facility] [address], [address] [place], [place] [address] Ph: [phone] Fax: [phone]  CC: Referring Provider

## 2017-09-29 NOTE — Progress Notes (Signed)
**  Xeomin 100 units x 6 vials, NDC [id number], Lot [id number], Exp 09/2019, office supply.//mck,rn**

## 2017-12-21 NOTE — Telephone Encounter (Signed)
EMG guided xeomin injection for spastic right hemiparesis in February 2019,  Can patient get assistance program through pharmaceutical company?  If not, is up to patient to decide whether he continue injection are not with his high copayment.

## 2017-12-21 NOTE — Telephone Encounter (Signed)
I called patient to let him know he owes a balance for last injection. The patient states he has read about a new medication that is supposed to reverse the symptoms of a stroke and he would like to know more about this. He is not sure he wants to continue the Xeomin injections because they are costing him 600 dollars and he has noticed no difference. He would like to know if Dr. Terrace Arabia would suggest him trying it a second time. Please call and advise.

## 2017-12-23 ENCOUNTER — Encounter: Payer: Self-pay | Admitting: Neurology

## 2017-12-23 ENCOUNTER — Ambulatory Visit: Payer: Medicare Other | Admitting: Neurology

## 2017-12-23 ENCOUNTER — Telehealth: Payer: Self-pay | Admitting: Neurology

## 2017-12-23 VITALS — BP 150/85 | HR 69 | Ht 71.0 in | Wt 223.5 lb

## 2017-12-23 DIAGNOSIS — I69351 Hemiplegia and hemiparesis following cerebral infarction affecting right dominant side: Secondary | ICD-10-CM | POA: Diagnosis not present

## 2017-12-23 MED ORDER — INCOBOTULINUMTOXINA 100 UNITS IM SOLR
600.0000 [IU] | INTRAMUSCULAR | Status: DC
  Administered 2017-12-23: 600 [IU] via INTRAMUSCULAR

## 2017-12-23 NOTE — Telephone Encounter (Signed)
12 wk inj xeomin

## 2017-12-23 NOTE — Progress Notes (Signed)
PATIENT: [name] [name] DOB: 10-07-1955  No chief complaint on file.    HISTORICAL  [name] [name] is a 62 years old male, seen in refer by Dr. Pearlean Brownie for evaluation of EMG guided botulinum toxin injection for spastic right hemiparesis, initial evaluation was on August 12, 2017.  His primary care physician is Dr. 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  Now he is taking Plavix 75 mg daily  He received his only Botox injections in 2016 by Dr. Lucia Gaskins in his right arm and hand, relax his muscles some, but lost follow-up,  UPDATE Sep 29 2017: This is his first electrical stimulation guided xeomin injection, we used 600 units of xeomin  UPDATE Dec 23 2017: He received his first injection in February 2019, 600 units of xeomin, was injected to right arm and lower extremity muscles, is most concerned about his walking ability, did not notice any significant change, complains of right shoulder elbow finger spasticity, tightness  REVIEW OF SYSTEMS: Full 14 system review of systems performed and notable only for as above ALLERGIES: No Known Allergies  HOME MEDICATIONS: Current  Outpatient Medications  Medication Sig Dispense Refill  . atorvastatin (LIPITOR) 40 MG tablet 40 mg by PEG Tube route daily.    . clopidogrel (PLAVIX) 75 MG tablet TAKE ONE TABLET BY MOUTH ONCE DAILY 90 tablet 3  . doxazosin (CARDURA) 1 MG tablet Take 1 tablet by mouth daily.  0  . finasteride (PROSCAR) 5 MG tablet 5 mg.    [name] QUEtiapine (SEROQUEL) 25 MG tablet Take 25 mg by mouth at bedtime.     . sertraline (ZOLOFT) 25 MG tablet Take 25 mg by mouth daily.      Current Facility-Administered Medications  Medication Dose Route Frequency Provider Last Rate Last Dose  . incobotulinumtoxinA (XEOMIN) 100 units injection 600 Units  600 Units Intramuscular Q90 days Levert Feinstein, MD   600 Units at 09/29/17 1429    PAST MEDICAL HISTORY: Past Medical History:  Diagnosis Date  . 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  . Depression   . DVT (deep venous thrombosis) (HCC)   . 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 Dysphagia   . Enlarged prostate   . 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 Hypertension     PAST SURGICAL HISTORY: Past Surgical History:  Procedure Laterality Date  . ESOPHAGOSCOPY W/ PERCUTANEOUS GASTROSTOMY TUBE PLACEMENT  04/12/2013   Dr Murrell Converse, [facility]    FAMILY HISTORY: Family History  Problem Relation Age of Onset  . Heart Problems Mother   . Stroke Father   . High blood pressure Unknown   . Diabetes Unknown   . Heart Problems  Unknown     SOCIAL HISTORY:  Social History   Socioeconomic History  . Marital status: Single    Spouse name: Not on file  . Number of children: 0  . Years of education: 53  . Highest education level: Not on file  Occupational History  . Occupation: umemployed  Social Needs  . Financial resource strain: Not on file  . Food insecurity:    Worry: Not on file    Inability: Not on file  . Transportation needs:    Medical: Not on file    Non-medical: Not on file  Tobacco Use  .  Smoking status: Never Smoker  . Smokeless tobacco: Never Used  Substance and Sexual Activity  . Alcohol use: No  . Drug use: No  . Sexual activity: Yes  Lifestyle  . Physical activity:    Days per week: Not on file    Minutes per session: Not on file  . Stress: Not on file  Relationships  . Social connections:    Talks on phone: Not on file    Gets together: Not on file    Attends religious service: Not on file    Active member of club or organization: Not on file    Attends meetings of clubs or organizations: Not on file    Relationship status: Not on file  . Intimate partner violence:    Fear of current or ex partner: Not on file    Emotionally abused: Not on file    Physically abused: Not on file    Forced sexual activity: Not on file  Other Topics Concern  . Not on file  Social History Narrative   Lives at home by self.    Single.   Caffeine use: daily     PHYSICAL EXAM   There were no vitals filed for this visit.  Not recorded      There is no height or weight on file to calculate BMI.  PHYSICAL EXAMNIATION:  Gen: NAD, conversant, well nourised, obese, well groomed                     Cardiovascular: Regular rate rhythm, no peripheral edema, warm, nontender. Eyes: Conjunctivae clear without exudates or hemorrhage Neck: Supple, no carotid bruits. Pulmonary: Clear to auscultation bilaterally   NEUROLOGICAL EXAM:  MENTAL STATUS: Speech:    Speech is normal; fluent and spontaneous with normal comprehension.  Cognition:     Orientation to time, place and person     Normal recent and remote memory     Normal Attention span and concentration     Normal Language, naming, repeating,spontaneous speech     Fund of knowledge   CRANIAL NERVES: CN II: Visual fields are full to confrontation. Fundoscopic exam is normal with sharp discs and no vascular changes. Pupils are round equal and briskly reactive to light. CN III, IV, VI: extraocular movement are normal.  No ptosis. CN V: Facial sensation is intact to pinprick in all 3 divisions bilaterally. Corneal responses are intact.  CN VII: Face is symmetric with normal eye closure and smile. CN VIII: Hearing is normal to rubbing fingers CN IX, X: Palate elevates symmetrically. Phonation is normal. CN XI: Head turning and shoulder shrug are intact CN XII: Tongue is midline with normal movements and no atrophy.  MOTOR: Spastic right hemiparesis, upper extremity proximal muscles 4 -, distal muscles 4 -, significant right lower extremity muscle spasm, right lower extremity proximal distal muscle strength 3  REFLEXES: Reflexia on the right upper and lower extremity muscles, plantar responses were extensor on the right side  SENSORY: Intact to light touch, pinprick, positional sensation and vibratory sensation are intact in fingers and toes.  COORDINATION: Rapid alternating movements and fine finger movements are intact. There is no dysmetria on finger-to-nose and heel-knee-shin.    GAIT/STANCE: He needs pushed up to get up from seated position, dragging his right arm and right leg   DIAGNOSTIC DATA (LABS, IMAGING, TESTING) - I reviewed patient records, labs, notes, testing and imaging myself where available.   ASSESSMENT AND PLAN  Floyde Dingley is a 62 y.o. male   Spastic right hemiparesis due to large left pontine stroke in 2014  We used 600 units of xeomin, under electrical stimulation  Right pronator teres 50 units Right brachials 50 units  Right flexor carpi ulnaris 50 units  Right flexor digitorum profundus 50 units Right flexor digitorum superficialis 50 units Right pectoralis major 50 units Right latissimus dorsi 50 units  Right vastus lateralis 25 units Right vastus medialis 25 units Right adductor longus 25 units Right adductor magnus 25 units Right rectus femoralis 50 units  He will return to clinic in 3 months for repeat injection, refer him to PT OT  Levert Feinstein, M.D.  Ph.D.  [facility] [address], [address] [place], [place] [address] Ph: [phone] Fax: [phone]  CC: Referring Provider

## 2017-12-31 NOTE — Telephone Encounter (Signed)
Patient has already been scheduled for injection 

## 2018-03-26 ENCOUNTER — Telehealth: Payer: Self-pay | Admitting: Neurology

## 2018-03-26 NOTE — Telephone Encounter (Signed)
Patient called regarding rescheduling. He is not sure if he will have a ride. I told him the reschedule may push him out a few weeks. He is going to check with his ride and see if they can pick him up and he will call me back Monday to confirm.

## 2018-03-29 ENCOUNTER — Telehealth: Payer: Self-pay | Admitting: Neurology

## 2018-03-29 NOTE — Telephone Encounter (Signed)
Noted, thank you

## 2018-03-29 NOTE — Telephone Encounter (Signed)
Xeomin appt -   I spoke to the patient and offered him an appt on 04/01/18 at 3pm.  He is aware to arrive at 2:45pm.

## 2018-03-29 NOTE — Telephone Encounter (Signed)
Marcelino DusterMichelle, should I offer him a research spot?

## 2018-03-29 NOTE — Telephone Encounter (Signed)
Please call pt to R/S 8/21 Injection appt. Pt states he will not have a ride back from his appt.

## 2018-03-31 ENCOUNTER — Ambulatory Visit: Payer: Self-pay | Admitting: Neurology

## 2018-04-01 ENCOUNTER — Encounter: Payer: Self-pay | Admitting: Neurology

## 2018-04-01 ENCOUNTER — Ambulatory Visit: Payer: Medicare Other | Admitting: Neurology

## 2018-04-01 ENCOUNTER — Telehealth: Payer: Self-pay | Admitting: Neurology

## 2018-04-01 VITALS — BP 136/79 | HR 71 | Ht 71.0 in | Wt 222.0 lb

## 2018-04-01 DIAGNOSIS — G8111 Spastic hemiplegia affecting right dominant side: Secondary | ICD-10-CM

## 2018-04-01 MED ORDER — INCOBOTULINUMTOXINA 100 UNITS IM SOLR
600.0000 [IU] | INTRAMUSCULAR | Status: AC
  Administered 2018-04-01: 600 [IU] via INTRAMUSCULAR

## 2018-04-01 MED ORDER — GABAPENTIN 300 MG PO CAPS: 300.0000 mg | ORAL_CAPSULE | Freq: Three times a day (TID) | ORAL | 11 refills | Status: AC

## 2018-04-01 NOTE — Progress Notes (Signed)
**  Xeomin 100 units x 6 vials, NDC 0259-1610-01, Lot [id number], Exp 05/2020, office supply.//mck,rn** 

## 2018-04-01 NOTE — Progress Notes (Signed)
PATIENT: [name] [name] DOB: 1956/05/24  Chief Complaint  Patient presents with  . Right Spastic Hemiplegia    Xeomin 100 units x 6 vials - office supply     HISTORICAL  [name] NissenRobert Dorsey is a 62 years old male, seen in refer by Dr. Pearlean BrownieSethi for evaluation of EMG guided botulinum toxin injection for spastic right hemiparesis, initial evaluation was on August 12, 2017.  His primary care physician is Dr. 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  Now he is taking Plavix 75 mg daily  He received his only Botox injections in 2016 by Dr. Lucia GaskinsAhern in his right arm and hand, relax his muscles some, but lost follow-up,  UPDATE Sep 29 2017: This is his first electrical stimulation guided xeomin injection, we used 600 units of xeomin  UPDATE Dec 23 2017: He received his first injection in February 2019, 600 units of xeomin, was injected to right arm and lower extremity muscles, is most concerned about his walking ability, did not notice any significant change, complains of right shoulder elbow finger spasticity, tightness  UPDATE April 01 2018: He is alone at today's clinical visit, where he  was able to talk with his brother over the phone, patient reported no significant improvement with previous injection, this will be his third trial, he will only come back to continue injection if there was significant benefit,  we emphasized the injection to his spastic right upper extremity, also reeducate the patient that the injection will focus on improving muscle spasticity and pain, will not change the weakness of the muscles,  REVIEW OF SYSTEMS: Full 14 system review of systems performed and notable only for as above ALLERGIES: No Known Allergies  HOME MEDICATIONS: Current Outpatient Medications  Medication Sig Dispense Refill  . atorvastatin (LIPITOR) 40 MG tablet 40 mg by PEG Tube route daily.    . clopidogrel (PLAVIX) 75 MG tablet TAKE ONE TABLET BY MOUTH ONCE DAILY 90 tablet 3  . doxazosin (CARDURA) 1 MG tablet Take 1 tablet by mouth daily.  0  . finasteride (PROSCAR) 5 MG tablet 5 mg.    . IncobotulinumtoxinA (XEOMIN IM) Inject 600 Units into the muscle every 3 (three) months.    . QUEtiapine (SEROQUEL) 25 MG tablet Take 25 mg by mouth at bedtime.     . sertraline (ZOLOFT) 25 MG tablet Take 25 mg by mouth daily.      No current facility-administered medications for this visit.     PAST MEDICAL HISTORY: Past Medical History:  Diagnosis Date  . 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  . Depression   . DVT (deep venous thrombosis) ([facility])   . Dysarthria as late effect of stroke  05/01/2013  . Dysphagia   . Enlarged prostate   . 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 Hypertension     PAST SURGICAL HISTORY: Past Surgical History:  Procedure Laterality Date  . ESOPHAGOSCOPY W/ PERCUTANEOUS GASTROSTOMY TUBE PLACEMENT  04/12/2013   Dr Murrell Converse, [facility]    FAMILY HISTORY: Family History  Problem Relation Age of Onset  . Heart Problems Mother   . Stroke Father   . High blood pressure Unknown   . Diabetes Unknown   . Heart  Problems Unknown     SOCIAL HISTORY:  Social History   Socioeconomic History  . Marital status: Single    Spouse name: Not on file  . Number of children: 0  . Years of education: 61  . Highest education level: Not on file  Occupational History  . Occupation: umemployed  Social Needs  . Financial resource strain: Not on file  . Food insecurity:    Worry: Not on file    Inability: Not on file  . Transportation needs:    Medical: Not on file    Non-medical: Not on file  Tobacco Use  . Smoking status: Never Smoker  . Smokeless tobacco: Never Used  Substance and Sexual Activity  . Alcohol use: No  . Drug use: No  . Sexual activity: Yes  Lifestyle  . Physical activity:    Days per week: Not on file    Minutes per session: Not on file  . Stress: Not on file  Relationships  . Social connections:    Talks on phone: Not on file    Gets together: Not on file    Attends religious service: Not on file    Active member of club or organization: Not on file    Attends meetings of clubs or organizations: Not on file    Relationship status: Not on file  . Intimate partner violence:    Fear of current or ex partner: Not on file    Emotionally abused: Not on file    Physically abused: Not on file    Forced sexual activity: Not on file  Other Topics Concern  . Not on file  Social History Narrative   Lives at home by self.    Single.   Caffeine use: daily     PHYSICAL EXAM   Vitals:   04/01/18 1403  BP: 136/79  Pulse: 71  Weight: 222 lb (100.7 kg)  Height: 5\' 11"  (1.803 m)    Not recorded      Body mass index is 30.96 kg/m.  PHYSICAL EXAMNIATION:  Gen: NAD, conversant, well nourised, obese, well groomed                     Cardiovascular: Regular rate rhythm, no peripheral edema, warm, nontender. Eyes: Conjunctivae clear without exudates or hemorrhage Neck: Supple, no carotid bruits. Pulmonary: Clear to auscultation bilaterally   NEUROLOGICAL EXAM:  MENTAL  STATUS: Speech:    Speech is normal; fluent and spontaneous with normal comprehension.  Cognition:     Orientation to time, place and person     Normal recent and remote memory     Normal Attention span and concentration     Normal Language, naming, repeating,spontaneous speech     Fund of knowledge   CRANIAL NERVES: CN II: Visual fields are full to confrontation. Fundoscopic exam is normal with sharp discs and no vascular changes. Pupils are round equal and briskly reactive to light. CN III, IV,  VI: extraocular movement are normal. No ptosis. CN V: Facial sensation is intact to pinprick in all 3 divisions bilaterally. Corneal responses are intact.  CN VII: Face is symmetric with normal eye closure and smile. CN VIII: Hearing is normal to rubbing fingers CN IX, X: Palate elevates symmetrically. Phonation is normal. CN XI: Head turning and shoulder shrug are intact CN XII: Tongue is midline with normal movements and no atrophy.  MOTOR: Spastic right hemiparesis, upper extremity proximal muscles 4 -, distal muscles 4 -, significant right lower extremity muscle spasm, right lower extremity proximal distal muscle strength 3  REFLEXES: Reflexia on the right upper and lower extremity muscles, plantar responses were extensor on the right side  SENSORY: Intact to light touch, pinprick, positional sensation and vibratory sensation are intact in fingers and toes.  COORDINATION: Rapid alternating movements and fine finger movements are intact. There is no dysmetria on finger-to-nose and heel-knee-shin.    GAIT/STANCE: He needs pushed up to get up from seated position, dragging his right arm and right leg   DIAGNOSTIC DATA (LABS, IMAGING, TESTING) - I reviewed patient records, labs, notes, testing and imaging myself where available.   ASSESSMENT AND PLAN  Leaf Kernodle is a 62 y.o. male   Spastic right hemiparesis due to large left pontine stroke in 2014  We used 600 units of xeomin,  under electrical stimulation  Right pronator teres 50 units Right brachials100 units Right biceps 50 units Right palmaris longus 50 units Right flexor carpi ulnaris 50 units  Right flexor digitorum profundus 50 units Right flexor digitorum superficialis 50 units Right pectoralis major 100 units Right latissimus dorsi 100 units   He will return to clinic in 3 months for repeat injection, refer him to PT OT  Levert Feinstein, M.D. Ph.D.  [facility] [address], [address] [place], [place] [address] Ph: [phone] Fax: [phone]  CC: Referring Provider

## 2018-04-01 NOTE — Telephone Encounter (Signed)
Pt. Needs 60 day Xeomin inj

## 2018-04-01 NOTE — Addendum Note (Signed)
Addended by: Levert FeinsteinYAN, Christophere Hillhouse on: 04/01/2018 05:04 PM   Modules accepted: Orders

## 2018-04-05 NOTE — Telephone Encounter (Signed)
Katrina, would you please contact patient regarding treatment options?

## 2018-04-05 NOTE — Telephone Encounter (Signed)
Per vo by Dr. Terrace ArabiaYan, the patient will need to return to care with Dr. Pearlean BrownieSethi to discuss alternate options.  She has asked that his next Xeomin appt be canceled since the therapy has been unhelpful.

## 2018-04-05 NOTE — Telephone Encounter (Signed)
I called to schedule the patient but he said that the injections have not helped him at all. He would like to know if there is something else he could do. Please call and advise.

## 2018-04-06 ENCOUNTER — Telehealth: Payer: Self-pay | Admitting: Neurology

## 2018-04-06 NOTE — Telephone Encounter (Addendum)
Spoke to patient to discuss options below from Dr. Terrace ArabiaYan.  Included in this discussion were the potential improvements he can expect to see with Botox (less spasticity of involved muscles, less muscle achy pain).  He is agreeable to try Botox.  However, he needs to know the co-pay prior to having the injections to make sure this therapy is affordable.  Per vo by Dr. Terrace ArabiaYan, please get Botox 600 units approved with his insurance company and call the patient with his estimated cost.  Dr. Terrace ArabiaYan would also like this plan reviewed with his brother, Edwin Cap[place]wood Mccord ([place]  I have reached out to his brother and left a message requesting a return call.

## 2018-04-06 NOTE — Telephone Encounter (Addendum)
Spoke to patient's brother, [name] [name] ([place] who helps with his care.  He is also in agreement to start the process of getting Botox approved and checking the cost.    Both the patient and his brother will need to be contacted concerning the cost of Botox.  His brother works at [employer] and his work number is the best place to reach him during the work day [phone](#[phone] - ask for Mr. Laurina B[employer]ukes).

## 2018-04-06 NOTE — Telephone Encounter (Signed)
See other telephone encounter for further details on treatment.

## 2018-04-06 NOTE — Telephone Encounter (Signed)
From Dr. Pearlean BrownieSethi: Spoke to the patient informed me that he had had 3 injections of xeomin and was not sure he had noticed any benefit and he was wondering if there was an alternative treatment which should be tried. I informed him that I would leave it up to Dr. Terrace ArabiaYan who is expert in spasticity treatment to decide if she needs to switch to Botox or Myobloc instead.

## 2018-04-06 NOTE — Telephone Encounter (Signed)
Please call patient, patient called office again, complaining of lack of response to previous xeomin injection, we have performed a 3 large dose xeomin injection since February 2019, most recent one was on April 01, 2018.  On examination, patient was noted to have significant spastic right hemiparesis due to large left pontine stroke 2014, involving right upper and lower extremities, significant weakness was noted too.  I had multiple discussion was patient during his office visit, the expected improvement with botulism toxin injection are less spasticity of involved muscles, less muscle achy pain, patient seems to hope to get stronger with botulism toxin injection,  Marcelino DusterMichelle, please call patient's again, try to explain the potential improvement he would get from botulism toxin injection, may consider talk with his brother.  If you wish, we will give him another trial of different botulism toxin injection, instead of xeomin, we can use Botox a 600 units  If he truly did not have any significant improvement with his injection, may consider baclofen, tizanidine, which will have significant systemic side effect such as drowsiness,

## 2018-04-06 NOTE — Telephone Encounter (Signed)
I spoke to the patient informed me that he had had 3 injections of xeomin and was not sure he had noticed any benefit and he was wondering if there was an alternative treatment which should be tried. I informed him that I would leave it up to Dr. Terrace ArabiaYan who is expert in spasticity treatment to decide if she needs to switch to Botox or Myobloc instead

## 2018-04-07 NOTE — Telephone Encounter (Signed)
I called the patient but he did not answer so I left a VM asking him to call back.   I called and spoke with his brother regarding the cost, he is going to speak with Molly Maduroobert and give us a call back.   The estimated out of pocket cost is around 750.00.

## 2018-06-30 ENCOUNTER — Ambulatory Visit: Payer: Medicare Other | Admitting: Neurology

## 2018-08-12 ENCOUNTER — Other Ambulatory Visit: Payer: Self-pay | Admitting: Neurology

## 2018-08-13 ENCOUNTER — Other Ambulatory Visit: Payer: Self-pay | Admitting: Neurology

## 2019-05-17 ENCOUNTER — Other Ambulatory Visit: Payer: Self-pay | Admitting: Neurology

## 2019-10-21 ENCOUNTER — Other Ambulatory Visit: Payer: Medicare Other

## 2019-10-24 ENCOUNTER — Ambulatory Visit: Payer: Medicare Other | Attending: Internal Medicine

## 2019-10-24 DIAGNOSIS — Z23 Encounter for immunization: Secondary | ICD-10-CM

## 2019-10-24 NOTE — Progress Notes (Signed)
   Covid-19 Vaccination Clinic  Name:  Chaz Mcglasson    MRN: [id number] DOB: 31-May-1956  10/24/2019  Mr. Tant was observed post Covid-19 immunization for 15 minutes without incident. He was provided with Vaccine Information Sheet and instruction to access the V-Safe system.   Mr. Doverspike was instructed to call 911 with any severe reactions post vaccine: [name] Difficulty breathing  . Swelling of face and throat  . A fast heartbeat  . A bad rash all over body  . Dizziness and weakness   Immunizations Administered    Name Date Dose VIS Date Route   Pfizer COVID-19 Vaccine 10/24/2019  3:35 PM 0.3 mL 07/22/2019 Intramuscular   Manufacturer: [employer], [employer]   Lot: [id number]   NDC: [address]-4

## 2019-11-16 ENCOUNTER — Ambulatory Visit: Payer: Medicare Other | Attending: Internal Medicine

## 2019-11-16 DIAGNOSIS — Z23 Encounter for immunization: Secondary | ICD-10-CM

## 2019-11-16 NOTE — Progress Notes (Signed)
   Covid-19 Vaccination Clinic  Name:  [name] [name]    MRN: [id number] DOB: April 01, 1956  11/16/2019  [name] was observed post Covid-19 immunization for 15 minutes without incident. He was provided with Vaccine Information Sheet and instruction to access the V-Safe system.   [name] was instructed to call 911 with any severe reactions post vaccine: [name] Difficulty breathing  . Swelling of face and throat  . A fast heartbeat  . A bad rash all over body  . Dizziness and weakness   Immunizations Administered    Name Date Dose VIS Date Route   Pfizer COVID-19 Vaccine 11/16/2019  3:03 PM 0.3 mL 07/22/2019 Intramuscular   Manufacturer: [employer], [employer]   Lot: [id number]   NDC: [address]-9

## 2021-04-05 ENCOUNTER — Other Ambulatory Visit: Payer: Self-pay | Admitting: Family Medicine

## 2021-04-05 ENCOUNTER — Ambulatory Visit
Admission: RE | Admit: 2021-04-05 | Discharge: 2021-04-05 | Disposition: A | Discharge status: Home / Self Care | Payer: Medicare Other | Source: Ambulatory Visit | Attending: Family Medicine | Admitting: Family Medicine

## 2021-04-05 DIAGNOSIS — M79642 Pain in left hand: Secondary | ICD-10-CM

## 2021-04-05 DIAGNOSIS — M25532 Pain in left wrist: Secondary | ICD-10-CM

## 2021-11-20 ENCOUNTER — Ambulatory Visit: Payer: Medicare Other | Admitting: Podiatry

## 2021-11-20 DIAGNOSIS — L603 Nail dystrophy: Secondary | ICD-10-CM | POA: Diagnosis not present

## 2021-11-20 NOTE — Progress Notes (Signed)
?Subjective:  ?Patient ID: [name] [name], male    DOB: 1955/12/26,  MRN: [id number] ? ?Chief Complaint  ?Patient presents with  ? Toe Pain  ? ? ?66 y.o. male presents with the above complaint.  Patient presents with right hallux nail dystrophy status post self avulsion of the nail.  He wanted to get it evaluated to make sure that there is nothing concerning going on.  He does have history of CVA to the right side making the right side weaker.  He states that the nail just fell off and had thickened dystrophic painful.  He states the pain is a little bit better now.  He denies seeing anyone else prior to seeing me he denies any other acute complaints. ? ? ?Review of Systems: Negative except as noted in the HPI. Denies N/V/F/Ch. ? ?Past Medical History:  ?Diagnosis Date  ? CVA (cerebral vascular accident) ([place]) 05/01/2013  ? left pontine infarct  ? Depression   ? DVT (deep venous thrombosis) ([place])   ? 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/01/2013  ? Dysphagia   ? Enlarged prostate   ? 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 Hypertension   ? ? ?Current Outpatient Medications:  ?  atorvastatin (LIPITOR) 40 MG tablet, 40 mg by PEG Tube route daily., Disp: , Rfl:  ?  clopidogrel (PLAVIX) 75 MG tablet, TAKE 1 TABLET BY MOUTH ONCE DAILY, Disp: 90 tablet, Rfl: 0 ?  doxazosin (CARDURA) 1 MG tablet, Take 1 tablet by mouth daily., Disp: , Rfl: 0 ?  finasteride (PROSCAR) 5 MG tablet, 5 mg., Disp: , Rfl:  ?  gabapentin (NEURONTIN) 300 MG capsule, Take 1 capsule (300 mg total) by mouth 3 (three) times daily., Disp: 30 capsule, Rfl: 11 ?  IncobotulinumtoxinA (XEOMIN IM), Inject 600 Units into the muscle every 3 (three) months., Disp: , Rfl:  ?  QUEtiapine (SEROQUEL) 25 MG tablet, Take 25 mg by mouth at bedtime. , Disp: , Rfl:  ?  sertraline (ZOLOFT) 25 MG tablet, Take 25 mg by mouth daily. , Disp: , Rfl:  ? ?Current Facility-Administered Medications:  ?  incobotulinumtoxinA (XEOMIN) 100 units injection 600 Units, 600  Units, Intramuscular, Q90 days, Marcial Pacas, MD, 600 Units at 04/01/18 1703 ? ?Social History  ? ?Tobacco Use  ?Smoking Status Never  ?Smokeless Tobacco Never  ? ? ?No Known Allergies ?Objective:  ?There were no vitals filed for this visit. ?There is no height or weight on file to calculate BMI. ?Constitutional Well developed. ?Well nourished.  ?Vascular Dorsalis pedis pulses palpable bilaterally. ?Posterior tibial pulses palpable bilaterally. ?Capillary refill normal to all digits.  ?No cyanosis or clubbing noted. ?Pedal hair growth normal.  ?Neurologic Normal speech. ?Oriented to person, place, and time. ?Epicritic sensation to light touch grossly present bilaterally.  ?Dermatologic Right second digit dry nailbed noted.  Some signs of regrowing of the newer nail noted.  No concern for infection noted no open wounds or lesion noted.  No redness noted.  ?Orthopedic: Normal joint ROM without pain or crepitus bilaterally. ?No visible deformities. ?No bony tenderness.  ? ?Radiographs: None ?Assessment:  ? ?1. Nail dystrophy   ? ?Plan:  ?Patient was evaluated and treated and all questions answered. ? 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  I encouraged shoe gear modification padding protecting to take some of the pressure  off of there for the pain.  He does have some underlying hammertoe deformity putting excessive stress to the second toenail.  I discussed with him in the future we can surgically help fix this.  He states understanding. ? ?No follow-ups on file. ?

## 2022-07-19 LAB — COLOGUARD

## 2022-08-05 LAB — COLOGUARD: COLOGUARD: NEGATIVE

## 2022-10-15 NOTE — Therapy (Signed)
OUTPATIENT OCCUPATIONAL THERAPY NEURO EVALUATION  Patient Name: [name] [name] MRN: [id number] DOB:Oct 15, 1955, 67 y.o., male Today's Date: 10/16/2022  PCP: Lucianne Lei, MD REFERRING PROVIDER: Lucianne Lei, MD  END OF SESSION:  OT End of Session - 10/16/22 1029     Visit Number 1    Number of Visits 9    Date for OT Re-Evaluation 12/16/22    Authorization Type ? 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   Hypertension    Past Surgical History:  Procedure Laterality Date   ESOPHAGOSCOPY W/ PERCUTANEOUS GASTROSTOMY TUBE PLACEMENT  04/12/2013   Dr Rinaldo Cloud, [facility]   Patient Active Problem List   Diagnosis Date Noted   Right spastic hemiplegia ([place]) 09/29/2017   OSA (obstructive sleep apnea) 10/11/2014   Excessive daytime sleepiness 10/11/2014   Hemiparesis ([place]) 10/11/2014   Dysarthria 10/11/2014   Pneumoperitoneum 05/01/2013   Lower GI bleed 05/01/2013   UTI (urinary tract infection) 05/01/2013   Supratherapeutic INR 05/01/2013   Chronic indwelling Foley catheter 05/01/2013   Jejunostomy tube present (?originally G tube?) 05/01/2013   Laceration of eyebrow s/p repair 04/26/2013 05/01/2013   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   Hemiparesis affecting right side as late effect of stroke ([place]) 05/01/2013   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/01/2013   DVT of right axillary vein, acute ([place]) 05/01/2013   CVA (cerebral vascular accident) ([place]) 05/01/2013   Anemia  05/01/2013   Dysphagia     ONSET DATE: 09/29/2022  REFERRING DIAG: Diagnosis G81.91 (ICD-10-CM) - Hemiplegia, unspecified affecting right dominant side  THERAPY DIAG:  Hemiplegia and hemiparesis following cerebral infarction affecting right dominant side ([place])  Other lack of coordination  Unsteadiness on feet  Rationale for Evaluation and Treatment: Rehabilitation  SUBJECTIVE:   SUBJECTIVE STATEMENT: Rt shoulder is weak but no pain Pt accompanied by: self  PERTINENT HISTORY: CVA 2014 w/ residual hemiplegia, HTN  PRECAUTIONS: Fall  WEIGHT BEARING RESTRICTIONS: No  PAIN:  Are you having pain? No  FALLS: Has patient fallen in last 6 months? No, but near falls  LIVING ENVIRONMENT: Lives with: lives alone (2 brothers lives within 20 minutes) Lives in: [place] in 1 story home, 1 step to enter Has following equipment at home: Single point cane, shower chair, and Ramped entry  PLOF: Independent  PATIENT GOALS: Not sure  OBJECTIVE:   HAND DOMINANCE:  was Rt handed prior to stroke, but Lt hand dominant last 10 years  ADLs: Eating: mod I Grooming: mod I  UB Dressing: mod I   LB Dressing: mod I  Toileting: mod I  Bathing: mod I  Tub Shower transfers: walk in shower w/ shower chair   IADLs: Shopping: mod I (relies on others for transportation)  Light housekeeping: mod I  Meal Prep: mod I w/ Air Fryer and Sales executive mobility: [employer] transportation or relies  on family Medication management: mod I  Financial management: - auto pay for most bills, brother assist for other things Handwriting:  uses Lt hand  MOBILITY STATUS:  walks w/ cane     UPPER EXTREMITY ROM:  LUE AROM WNL's RUE: minimal shoulder flex, goes into synergy pattern (sh abduction and elbow flex), elbow ext up to 90% - difficult d/t spasticity, sup to neutral, Full composite hand flexion 90%, 25% finger ext w/ passive movement difficulty (especially at PIP joints) d/t spasticity unless in  wrist flexion.  Full P/ROM Rt shoulder flexion  HAND FUNCTION: Use Rt hand as stabilizer  COORDINATION: Pt can place things in Rt hand to hold as stabilizer to gross assist but cannot release I'ly  SENSATION: Not tested  EDEMA: mild Rt hand  MUSCLE TONE: RUE: Moderate and Hypertonic  COGNITION: Overall cognitive status:  decreased awareness, tangential   VISION: Subjective report: I am going to the eye doctor in a couple weeks Baseline vision: Wears glasses for reading only  PERCEPTION: Not tested  PRAXIS: Not tested  OBSERVATIONS: pt easily gets off topic and needs to be redirected, decreased understanding of spasticity from stroke   TODAY'S TREATMENT:                                                                                                                              N/A  PATIENT EDUCATION: Education details: OT POC Person educated: Patient Education method: Explanation Education comprehension: verbalized understanding  HOME EXERCISE PROGRAM: N/A   GOALS: Goals reviewed with patient? Yes  SHORT TERM GOALS: Target date: 11/16/22  Independent with HEP  Baseline: Goal status: INITIAL  2.  Independent with resting hand splint wear and care Baseline:  Goal status: INITIAL  3.  Pt to verbalize understanding with bed positioning to help prevent pain Baseline:  Goal status: INITIAL   LONG TERM GOALS: Target date: 12/16/22  Pt to verbalize understanding with A/E for cooking to increase safety and ease Baseline:  Goal status: INITIAL  2.  Pt to verbalize understanding with contact info for driver evaluation and adaptations for operating vehicle with Lt side Baseline:  Goal status: INITIAL   ASSESSMENT:  CLINICAL IMPRESSION: Patient is a 67 y.o. male who was seen today for occupational therapy evaluation with chronic Rt side hemiplegia from stroke in 2014. Pt tangential during evaluation. Pt would benefit from O.T. to address: splinting needs, A/E  needs, updated HEP, and safety  PERFORMANCE DEFICITS: in functional skills including ADLs, IADLs, proprioception, edema, tone, ROM, pain, Gross motor control, mobility, body mechanics, decreased knowledge of precautions, decreased knowledge of use of DME, and UE functional use, cognitive skills including attention and safety awareness, and psychosocial skills including coping strategies.   IMPAIRMENTS: are limiting patient from ADLs, IADLs, and social participation.   CO-MORBIDITIES: may have co-morbidities  that affects occupational performance. Patient will benefit from skilled OT to address above impairments and improve overall function.  MODIFICATION OR ASSISTANCE  TO COMPLETE EVALUATION: No modification of tasks or assist necessary to complete an evaluation.  OT OCCUPATIONAL PROFILE AND HISTORY: Problem focused assessment: Including review of records relating to presenting problem.  CLINICAL DECISION MAKING: LOW - limited treatment options, no task modification necessary  REHAB POTENTIAL: Fair time since onset  EVALUATION COMPLEXITY: Low    PLAN:  OT FREQUENCY: 1x/week  OT DURATION: 8 weeks (plus eval)   PLANNED INTERVENTIONS: self care/ADL training, therapeutic exercise, therapeutic activity, neuromuscular re-education, manual therapy, passive range of motion, functional mobility training, splinting, electrical stimulation, moist heat, cryotherapy, patient/family education, cognitive remediation/compensation, energy conservation, and DME and/or AE instructions  RECOMMENDED OTHER SERVICES: none at this time  CONSULTED AND AGREED WITH PLAN OF CARE: Patient  PLAN FOR NEXT SESSION: resting hand splint, check on insurance authorization   Hans Eden, OT 10/16/2022, 10:30 AM

## 2022-10-16 ENCOUNTER — Ambulatory Visit: Payer: Medicare Other | Admitting: Physical Therapy

## 2022-10-16 ENCOUNTER — Ambulatory Visit: Payer: Medicare Other | Attending: Family Medicine | Admitting: Occupational Therapy

## 2022-10-16 VITALS — BP 144/91

## 2022-10-16 DIAGNOSIS — R278 Other lack of coordination: Secondary | ICD-10-CM | POA: Insufficient documentation

## 2022-10-16 DIAGNOSIS — I69351 Hemiplegia and hemiparesis following cerebral infarction affecting right dominant side: Secondary | ICD-10-CM | POA: Insufficient documentation

## 2022-10-16 DIAGNOSIS — R2681 Unsteadiness on feet: Secondary | ICD-10-CM | POA: Diagnosis present

## 2022-10-16 NOTE — Therapy (Signed)
OUTPATIENT PHYSICAL THERAPY NEURO EVALUATION   Patient Name: [name] [name] MRN: [id number] DOB:09-May-1956, 67 y.o., male Today's Date: 10/16/2022   PCP: Lucianne Lei, MD REFERRING PROVIDER: Lucianne Lei, MD  END OF SESSION:  PT End of Session - 10/16/22 1019     Visit Number 1    Number of Visits 1    Authorization Type UHC Medicare    Progress Note Due on Visit --    PT Start Time 1014    PT Stop Time 1100    PT Time Calculation (min) 46 min    Activity Tolerance Patient tolerated treatment well    Behavior During Therapy WFL for tasks assessed/performed             Past Medical History:  Diagnosis Date   CVA (cerebral vascular accident) ([place]) 05/01/2013   left pontine infarct   Depression    DVT (deep venous thrombosis) ([place])    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/01/2013   Dysphagia    Enlarged prostate    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   Hypertension    Past Surgical History:  Procedure Laterality Date   ESOPHAGOSCOPY W/ PERCUTANEOUS GASTROSTOMY TUBE PLACEMENT  04/12/2013   Dr Rinaldo Cloud, [facility]   Patient Active Problem List   Diagnosis Date Noted   Right spastic hemiplegia ([place]) 09/29/2017   OSA (obstructive sleep apnea) 10/11/2014   Excessive daytime sleepiness 10/11/2014   Hemiparesis ([place]) 10/11/2014   Dysarthria 10/11/2014   Pneumoperitoneum 05/01/2013   Lower GI bleed 05/01/2013   UTI (urinary tract infection) 05/01/2013   Supratherapeutic INR 05/01/2013   Chronic indwelling Foley catheter 05/01/2013   Jejunostomy tube present (?originally G tube?) 05/01/2013   Laceration of eyebrow s/p repair 04/26/2013 05/01/2013   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   Hemiparesis affecting right side as late effect of stroke ([place]) 05/01/2013   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/01/2013   DVT of right axillary vein, acute ([place]) 05/01/2013   CVA (cerebral vascular accident) ([place]) 05/01/2013   Anemia 05/01/2013   Dysphagia      ONSET DATE: 09/26/2022 (referral)   REFERRING DIAG: G81.91 (ICD-10-CM) - Hemiplegia, unspecified affecting right dominant side  THERAPY DIAG:  Hemiplegia and hemiparesis following cerebral infarction affecting right dominant side ([place])  Rationale for Evaluation and Treatment: Rehabilitation  SUBJECTIVE:                                                                                                                                                                                             SUBJECTIVE STATEMENT: Pt presents to clinic w/SPC and wearing solid  posterior AFO on RLE. Pt reports he lives alone and is sedentary at home. "I am limited on what I can do anyway". Rides his stationary bike daily. Inquiring about therapist changing his medications because the "drugs have not fixed my arm". Reports he did not take his meds this morning, carrying them in a plastic bag. Denies recent falls. Reports he wants to ride a bike around the city and climb trees again.    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 No  VITALS  Vitals:   10/16/22 1021  BP: (!) 144/91     PRECAUTIONS: Fall  WEIGHT BEARING RESTRICTIONS: No  FALLS: Has patient fallen in last 6 months? 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 to the level of superior cerebellar arteries with hyperdense thrombus in the mid to distal basilar artery, he was taken for basilar thrombectomy, this did not reestablish flow, MRI of the brain revealed left pontine stroke with some medial pontine involvement  COGNITION: Overall cognitive status: Impaired and Difficulty to assess due to: no family present   MUSCLE TONE: RLE: Hypotonic and Flaccid   POSTURE: rounded shoulders, forward head, increased thoracic kyphosis, and weight shift left   BED MOBILITY:  Independent per pt  TRANSFERS: Assistive device utilized: Single point cane  Sit to stand: Modified independence Stand to sit: Modified independence  GAIT: Gait pattern: step through pattern, decreased step length- Left, decreased stance time- Right, decreased stride length, decreased hip/knee flexion- Right, decreased ankle dorsiflexion- Right, and lateral lean- Right Distance walked: Various clinic distances Assistive device utilized: Single point cane Level of assistance: Modified independence                                                                                                                     PATIENT EDUCATION: Education details: Pt perseverating on driving, riding a bike, climbing a tree and returning full function of R hemibody throughout  eval and was not receptive to education from therapists or doctors regarding unrealistic goals for return to PLOF as his stroke was 10 years  ago. Pt reporting he really just wants to work on his RUE function at this time if "you are not going to teach me to ride a bike". Educated pt on role of PT and realistic goals, but pt declining at this time and reports he will continue to seek out a TKA to "fix" his hemiplegia.  Person educated: Patient Education method: Customer service manager Education comprehension: needs further education   GOALS: N/A at this time   ASSESSMENT:  CLINICAL IMPRESSION: Patient is a 67 year old male referred to Neuro OPPT for R hemiplegia. Pt's PMH is significant for: bilateral pontine CVA (03/31/13); basilar thrombectomy (04/05/13); HTN. At this time, pt is not a candidate for skilled PT services as he would like to work on his RUE w/OT and has unrealistic goals for mobility. Pt has been educated extensively on his unrealistic goals and potential w/PT, but demonstrates absent retention. Educated pt on role of PT and potential for rehab, but pt declining due to not aligning with his goals.    REHAB POTENTIAL: Poor Pt w/chronic stroke and impaired cognition   CLINICAL DECISION MAKING: Evolving/moderate complexity  EVALUATION COMPLEXITY: Moderate  PLAN:  PT FREQUENCY: one time visit   Naoko Diperna E Candon Caras, PT, DPT 10/16/2022, 11:14 AM

## 2022-11-13 ENCOUNTER — Ambulatory Visit: Payer: Medicare Other | Attending: Family Medicine | Admitting: Occupational Therapy

## 2022-11-13 DIAGNOSIS — I69351 Hemiplegia and hemiparesis following cerebral infarction affecting right dominant side: Secondary | ICD-10-CM | POA: Insufficient documentation

## 2022-11-13 DIAGNOSIS — R278 Other lack of coordination: Secondary | ICD-10-CM | POA: Insufficient documentation

## 2022-11-19 ENCOUNTER — Ambulatory Visit: Payer: Medicare Other | Admitting: Occupational Therapy

## 2022-11-26 ENCOUNTER — Ambulatory Visit: Payer: Medicare Other | Admitting: Occupational Therapy

## 2022-12-03 ENCOUNTER — Ambulatory Visit: Payer: Medicare Other | Admitting: Rehabilitative and Restorative Service Providers"

## 2022-12-03 ENCOUNTER — Encounter: Payer: Self-pay | Admitting: Rehabilitative and Restorative Service Providers"

## 2022-12-03 DIAGNOSIS — R278 Other lack of coordination: Secondary | ICD-10-CM

## 2022-12-03 DIAGNOSIS — I69351 Hemiplegia and hemiparesis following cerebral infarction affecting right dominant side: Secondary | ICD-10-CM | POA: Diagnosis present

## 2022-12-03 NOTE — Therapy (Signed)
OUTPATIENT OCCUPATIONAL THERAPY TREATMENT NOTE  Patient Name: [name] [name] MRN: [id number] DOB:12/09/1955, 67 y.o., male Today's Date: 12/03/2022  PCP: Renaye Rakers, MD REFERRING PROVIDER: Renaye Rakers, MD  END OF SESSION:  OT End of Session - 12/03/22 1008     Visit Number 2    Number of Visits 9    Date for OT Re-Evaluation 12/16/22    Authorization Type UHC    OT Start Time 1015    OT Stop Time 1113    OT Time Calculation (min) 58 min    Equipment Utilized During Treatment orhtotic materials    Activity Tolerance Patient tolerated treatment well;No increased pain;Patient limited by lethargy    Behavior During Therapy [facility] for tasks assessed/performed             Past Medical History:  Diagnosis Date   CVA (cerebral vascular accident) 05/01/2013   left pontine infarct   Depression    DVT (deep venous thrombosis)    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/01/2013   Dysphagia    Enlarged prostate    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   Hypertension    Past Surgical History:  Procedure Laterality Date   ESOPHAGOSCOPY W/ PERCUTANEOUS GASTROSTOMY TUBE PLACEMENT  04/12/2013   Dr Murrell Converse, [facility]   Patient Active Problem List   Diagnosis Date Noted   Right spastic hemiplegia 09/29/2017   OSA (obstructive sleep apnea) 10/11/2014   Excessive daytime sleepiness 10/11/2014   Hemiparesis 10/11/2014   Dysarthria 10/11/2014   Pneumoperitoneum 05/01/2013   Lower GI bleed 05/01/2013   UTI (urinary tract infection) 05/01/2013   Supratherapeutic INR 05/01/2013   Chronic indwelling Foley catheter 05/01/2013   Jejunostomy tube present (?originally G tube?) 05/01/2013   Laceration of eyebrow s/p repair 04/26/2013 05/01/2013   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   Hemiparesis affecting right side as late effect of stroke 05/01/2013   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/01/2013   DVT of right axillary vein, acute 05/01/2013   CVA (cerebral  vascular accident) 05/01/2013   Anemia 05/01/2013   Dysphagia     ONSET DATE: 09/29/2022  REFERRING DIAG: Diagnosis G81.91 (ICD-10-CM) - Hemiplegia, unspecified affecting right dominant side  THERAPY DIAG:  Hemiplegia and hemiparesis following cerebral infarction affecting right dominant side  Other lack of coordination  Rationale for Evaluation and Treatment: Rehabilitation  PERTINENT HISTORY: CVA 2014 w/ residual hemiplegia, HTN  PRECAUTIONS: Fall; WEIGHT BEARING RESTRICTIONS: No   SUBJECTIVE:   SUBJECTIVE STATEMENT: He arrives limping, carrying cane, stating taking life "one day at a time." He states not having or recalling any exercises from any past therapies. He is verbose, complaining of leg and knee pain and off-topic conversation at times, but also seems to earnestly not understand the importance of keeping moving after stroke.    PAIN:  Are you having pain? No  FALLS: Has patient fallen in last 6 months? No, but near falls  LIVING ENVIRONMENT: Lives with: lives alone (2 brothers lives within 20 minutes) Lives in: [place] in 1 story home, 1 step to enter Has following equipment at home: Single point cane, shower chair, and Ramped entry  PLOF: Independent  PATIENT GOALS: Not sure   OBJECTIVE: (All objective assessments below are from initial evaluation on: 10/16/22 unless otherwise specified.)   HAND DOMINANCE:  was Rt handed prior to stroke, but Lt hand dominant last 10 years  ADLs: Eating: mod I Grooming: mod I  UB Dressing: mod I   LB  Dressing: mod I  Toileting: mod I  Bathing: mod I  Tub Shower transfers: walk in shower w/ shower chair   IADLs: Shopping: mod I (relies on others for transportation)  Light housekeeping: mod I  Meal Prep: mod I w/ Air Geophysicist/field seismologist mobility: [employer] transportation or relies on family Medication management: mod I  Landscape architect: - auto pay for most bills, brother assist for other  things Handwriting:  uses Lt hand  MOBILITY STATUS:  walks w/ cane  UPPER EXTREMITY ROM:  LUE AROM WNL's RUE: minimal shoulder flex, goes into synergy pattern (sh abduction and elbow flex), elbow ext up to 90% - difficult d/t spasticity, sup to neutral, Full composite hand flexion 90%, 25% finger ext w/ passive movement difficulty (especially at PIP joints) d/t spasticity unless in wrist flexion.  Full P/ROM Rt shoulder flexion  HAND FUNCTION: Use Rt hand as stabilizer  COORDINATION: Pt can place things in Rt hand to hold as stabilizer to gross assist but cannot release I'ly  SENSATION: Not tested  EDEMA: mild Rt hand  MUSCLE TONE: RUE: Moderate and Hypertonic  COGNITION: Overall cognitive status:  decreased awareness, tangential   VISION: Subjective report: I am going to the eye doctor in a couple weeks Baseline vision: Wears glasses for reading only  OBSERVATIONS: pt easily gets off topic and needs to be redirected, decreased understanding of spasticity from stroke   TODAY'S TREATMENT:                                                                                                                               12/03/22: OT custom fabricates a Rt hand anti-spasticity (ball) orthotic today, to hold hand in outstretched position, help reduce tightness and spasticity to help with function.  It was challenging to fabricate, as he has apparent boutonierre deformities (though somewhat flexible) in RF and MF of Rt hand. His FA tightness also made it hard to supinate for orthotic fitting. In the end, it seemed to fit well, no areas of pressure, and hold his hand open and fingers in open, natural cascade. He was edu to take off if bothering him, bring back for adjustments as needed (may need some for thumb).  OT also was trying to answer his barrage of health questions and he seemed shocked to hear that he should be trying to use/move his hand.  He wants to wear the orthosis all day and  night, bu tOT only recommends at night and sometimes in day, if he wishes to stretch. The remainder of the time he should be trying to actively use his hand as he did show fair or better muscular control of hand today- just very tight. He was given the following HEP and advised to do 2-4 x day everyday to try to regain better motion of dom Rt arm. He has been neglecting it.   Exercises - Seated Shoulder Flexion Towel Slide at Table Top  -  4 x daily - 3-5 reps - 15 hold - Seated Shoulder Abduction Towel Slide at Table Top  - 3-4 x daily - 3-5 reps - 15 hold - Seated Shoulder External Rotation PROM on Table  - 3-4 x daily - 3-5 reps - 15 sec hold - Standing Elbow Flexion Extension AROM  - 4-6 x daily - 10-15 reps - Forearm Supination Stretch  - 3-4 x daily - 3-5 reps - 15 sec hold - Forearm Pronation Stretch  - 3-4 x daily - 3-5 reps - 15 sec hold - Turn [employer] Facing Up & Down  - 4-6 x daily - 10-15 reps - Wrist Flexion Stretch  - 4 x daily - 3-5 reps - 15 sec hold - Wrist Extension Stretch Pronated  - 4 x daily - 3-5 reps - 15 hold - Bend and Pull Back Wrist SLOWLY  - 4 x daily - 10-15 reps - Finger Spreading  - 4-6 x daily - 10-15 reps - PUSH KNUCKLES DOWN  - 4 x daily - 3-5 reps - 15 seconds hold - Tendon Glides  - 4-6 x daily - 3-5 reps - 2-3 seconds hold  PATIENT EDUCATION: Education details: OT POC Person educated: Patient Education method: Explanation Education comprehension: verbalized understanding  HOME EXERCISE PROGRAM: Access Code: [email or web address] URL: [email or web address] Date: 12/03/2022 Prepared by: Fannie Knee     GOALS: Goals reviewed with patient? Yes  SHORT TERM GOALS: Target date: 11/16/22  Independent with HEP  Baseline: Goal status: INITIAL  2.  Independent with resting hand splint wear and care Baseline:  Goal status: INITIAL  3.  Pt to verbalize understanding with bed positioning to help prevent pain Baseline:  Goal status:  INITIAL   LONG TERM GOALS: Target date: 12/16/22  Pt to verbalize understanding with A/E for cooking to increase safety and ease Baseline:  Goal status: INITIAL  2.  Pt to verbalize understanding with contact info for driver evaluation and adaptations for operating vehicle with Lt side Baseline:  Goal status: INITIAL   ASSESSMENT:  CLINICAL IMPRESSION: 12/03/22: He was actually able to partially open/close hand and flex, ext Rt wrist today on command with no lag. This shows at least fair control, and OT believes he could be doing much better in Rt hand if he was active and actively stretching/using his hand. Continue on   Patient is a 67 y.o. male who was seen today for occupational therapy evaluation with chronic Rt side hemiplegia from stroke in 2014. Pt tangential during evaluation. Pt would benefit from O.T. to address: splinting needs, A/E needs, updated HEP, and safety    PLAN:  OT FREQUENCY: 1x/week  OT DURATION: 8 weeks (plus eval)   PLANNED INTERVENTIONS: self care/ADL training, therapeutic exercise, therapeutic activity, neuromuscular re-education, manual therapy, passive range of motion, functional mobility training, splinting, electrical stimulation, moist heat, cryotherapy, patient/family education, cognitive remediation/compensation, energy conservation, and DME and/or AE instructions  CONSULTED AND AGREED WITH PLAN OF CARE: Patient  PLAN FOR NEXT SESSION:  Check orthotic as needed, check HEP and encourage fnl use of arm/hand. Would benefit from manual therapy and stretches, etc as well.    Fannie Knee, OT 12/03/2022, 6:32 PM

## 2022-12-10 ENCOUNTER — Ambulatory Visit: Payer: Medicare Other | Admitting: Occupational Therapy

## 2022-12-17 ENCOUNTER — Ambulatory Visit: Payer: Medicare Other | Attending: Family Medicine

## 2022-12-17 DIAGNOSIS — I69351 Hemiplegia and hemiparesis following cerebral infarction affecting right dominant side: Secondary | ICD-10-CM | POA: Insufficient documentation

## 2022-12-17 DIAGNOSIS — R278 Other lack of coordination: Secondary | ICD-10-CM | POA: Diagnosis present

## 2022-12-17 DIAGNOSIS — R2681 Unsteadiness on feet: Secondary | ICD-10-CM | POA: Diagnosis present

## 2022-12-17 NOTE — Patient Instructions (Signed)
[email or web address] Office: [phone]

## 2022-12-17 NOTE — Therapy (Signed)
OUTPATIENT OCCUPATIONAL THERAPY TREATMENT NOTE  Patient Name: [name] [name] MRN: [id number] DOB:09-11-1955, 67 y.o., male Today's Date: 12/17/2022  PCP: Renaye Rakers, MD REFERRING PROVIDER: Renaye Rakers, MD  END OF SESSION:  OT End of Session - 12/17/22 1057     Visit Number 3    Number of Visits 9    Date for OT Re-Evaluation 12/16/22    Authorization Type [employer]    OT Start Time 1103    OT Stop Time 1145    OT Time Calculation (min) 42 min    Activity Tolerance Patient tolerated treatment well;No increased pain;Patient limited by lethargy    Behavior During Therapy [facility] for tasks assessed/performed             Past Medical History:  Diagnosis Date   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   Depression    DVT (deep venous thrombosis) (HCC)    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/01/2013   Dysphagia    Enlarged prostate    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   Hypertension    Past Surgical History:  Procedure Laterality Date   ESOPHAGOSCOPY W/ PERCUTANEOUS GASTROSTOMY TUBE PLACEMENT  04/12/2013   Dr Murrell Converse, [facility]   Patient Active Problem List   Diagnosis Date Noted   Right spastic hemiplegia (HCC) 09/29/2017   OSA (obstructive sleep apnea) 10/11/2014   Excessive daytime sleepiness 10/11/2014   Hemiparesis (HCC) 10/11/2014   Dysarthria 10/11/2014   Pneumoperitoneum 05/01/2013   Lower GI bleed 05/01/2013   UTI (urinary tract infection) 05/01/2013   Supratherapeutic INR 05/01/2013   Chronic indwelling Foley catheter 05/01/2013   Jejunostomy tube present (?originally G tube?) 05/01/2013   Laceration of eyebrow s/p repair 04/26/2013 05/01/2013   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   Hemiparesis affecting right side as late effect of stroke (HCC) 05/01/2013   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/01/2013   DVT of right axillary vein, acute (HCC) 05/01/2013   CVA (cerebral vascular accident) (HCC)  05/01/2013   Anemia 05/01/2013   Dysphagia     ONSET DATE: 09/29/2022  REFERRING DIAG: Diagnosis G81.91 (ICD-10-CM) - Hemiplegia, unspecified affecting right dominant side  THERAPY DIAG:  Hemiplegia and hemiparesis following cerebral infarction affecting right dominant side (HCC)  Other lack of coordination  Unsteadiness on feet  Rationale for Evaluation and Treatment: Rehabilitation  PERTINENT HISTORY: CVA 2014 w/ residual hemiplegia, HTN  PRECAUTIONS: Fall; WEIGHT BEARING RESTRICTIONS: No   SUBJECTIVE:   SUBJECTIVE STATEMENT: Pt reports wearing the splint all the time, as much as her can. Please refer below for re-education provided regarding splint wear schedule. Pt describing what he can't do with his RUE. When asked about his HEP, pt reports limited use of HEP.  PAIN:  Are you having pain? No  FALLS: Has patient fallen in last 6 months? No, but near falls  LIVING ENVIRONMENT: Lives with: lives alone (2 brothers lives within 20 minutes) Lives in: [place] in 1 story home, 1 step to enter Has following equipment at home: Single point cane, shower chair, and Ramped entry  PLOF: Independent  PATIENT GOALS: Not sure   OBJECTIVE: (All objective assessments below are from initial evaluation on: 10/16/22 unless otherwise specified.)   HAND DOMINANCE:  was Rt handed prior to stroke, but Lt hand dominant last 10 years  ADLs: Eating: mod I Grooming: mod I  UB Dressing: mod I   LB Dressing: mod I  Toileting: mod I  Bathing:  mod I  Tub Shower transfers: walk in shower w/ shower chair   IADLs: Shopping: mod I (relies on others for transportation)  Light housekeeping: mod I  Meal Prep: mod I w/ Air Geophysicist/field seismologist mobility: [employer] transportation or relies on family Medication management: mod I  Landscape architect: - auto pay for most bills, brother assist for other things Handwriting:  uses Lt hand  MOBILITY STATUS:  walks w/ cane  UPPER  EXTREMITY ROM:  LUE AROM WNL's RUE: minimal shoulder flex, goes into synergy pattern (sh abduction and elbow flex), elbow ext up to 90% - difficult d/t spasticity, sup to neutral, Full composite hand flexion 90%, 25% finger ext w/ passive movement difficulty (especially at PIP joints) d/t spasticity unless in wrist flexion.  Full P/ROM Rt shoulder flexion  HAND FUNCTION: Use Rt hand as stabilizer  COORDINATION: Pt can place things in Rt hand to hold as stabilizer to gross assist but cannot release I'ly  SENSATION: Not tested  EDEMA: mild Rt hand  MUSCLE TONE: RUE: Moderate and Hypertonic  COGNITION: Overall cognitive status:  decreased awareness, tangential   VISION: Subjective report: I am going to the eye doctor in a couple weeks Baseline vision: Wears glasses for reading only  OBSERVATIONS: pt easily gets off topic and needs to be redirected, decreased understanding of spasticity from stroke   TODAY'S TREATMENT:                                                                                                                              Pt again today asking a variety of health questions, some related to RUE and others not. Frequent redirection needed. Pt asking for other options to improve his hand function; however, discussing outcome of chronic stroke and affected extremity results. Pt seems to have difficulty accepting this.  OT addressing custom fabricated splint from last visit.  Splint fit looks well. No skin irritation, no skin breakdown, no skin redness. Re-enforced wear schedule. Pt to wear at night and sometimes in day, if he wishes to stretch. Pt encouraged to use RUE as much as possible versus keeping it in the splint all the time. Pt also concerned about driving. OT providing him with Driver's Rehab resource as listed in pt instructions so pt can call them to ask questions. Pt reports poor sleep and desiring addressing pain medications or sleep medications. OT deferring  to PCP; however, handout provided for sleep positions with hemiplegia. Pt reports still sleeping in a hospital bed.  OT having pt perform exercises as listed below through seated elbow flexion and extensive as patient tangential and requiring redirection. Review of HEP to reinforce importance of actively using RUE given he has been wearing splint very frequently, and even states "I can't do movement with this splint."   Exercises - Seated Shoulder Flexion Towel Slide at Table Top  - 4 x daily - 3-5 reps - 15 hold - Seated Shoulder Abduction  Towel Slide at Table Top  - 3-4 x daily - 3-5 reps - 15 hold - Seated Shoulder External Rotation PROM on Table  - 3-4 x daily - 3-5 reps - 15 sec hold - Standing Elbow Flexion Extension AROM  - 4-6 x daily - 10-15 reps - Forearm Supination Stretch  - 3-4 x daily - 3-5 reps - 15 sec hold - Forearm Pronation Stretch  - 3-4 x daily - 3-5 reps - 15 sec hold - Turn [employer] Facing Up & Down  - 4-6 x daily - 10-15 reps - Wrist Flexion Stretch  - 4 x daily - 3-5 reps - 15 sec hold - Wrist Extension Stretch Pronated  - 4 x daily - 3-5 reps - 15 hold - Bend and Pull Back Wrist SLOWLY  - 4 x daily - 10-15 reps - Finger Spreading  - 4-6 x daily - 10-15 reps - PUSH KNUCKLES DOWN  - 4 x daily - 3-5 reps - 15 seconds hold - Tendon Glides  - 4-6 x daily - 3-5 reps - 2-3 seconds hold  PATIENT EDUCATION: Education details: Sleep position, driver's rehab, questions for PCP, splint schedule, review HEP Person educated: Patient Education method: Explanation, Demonstration, and Handouts Education comprehension: verbalized understanding  HOME EXERCISE PROGRAM: Access Code: [email or web address] URL: [email or web address] Date: 12/03/2022 Prepared by: Fannie Knee   GOALS: Goals reviewed with patient? Yes  SHORT TERM GOALS: Target date: 11/16/22  Independent with HEP  Baseline: Goal status: INITIAL  2.  Independent with resting hand splint wear and care Baseline:   Goal status: INITIAL  3.  Pt to verbalize understanding with bed positioning to help prevent pain Baseline:  Goal status: INITIAL   LONG TERM GOALS: Target date: 12/16/22  Pt to verbalize understanding with A/E for cooking to increase safety and ease Baseline:  Goal status: INITIAL  2.  Pt to verbalize understanding with contact info for driver evaluation and adaptations for operating vehicle with Lt side Baseline:  Goal status: INITIAL   ASSESSMENT:  CLINICAL IMPRESSION: Pt tangential during treatment today. Pt asking questions how to address RUE despite previous education and education today. Pt asking about how to address sleep habits as well. Please refer above for today's treatment for addressing education on splint and HEP and other health concerns. Pt so far has been non-compliant with instructions for HEP and splint; however, hopeful he will start being an active participant in his care versus talking about his concerns. Pt would continue to benefit from OT to address HEP and splint. Pt tolerating treatment well today.   PLAN:  OT FREQUENCY: 1x/week  OT DURATION: 8 weeks (plus eval)   PLANNED INTERVENTIONS: self care/ADL training, therapeutic exercise, therapeutic activity, neuromuscular re-education, manual therapy, passive range of motion, functional mobility training, splinting, electrical stimulation, moist heat, cryotherapy, patient/family education, cognitive remediation/compensation, energy conservation, and DME and/or AE instructions  CONSULTED AND AGREED WITH PLAN OF CARE: Patient  PLAN FOR NEXT SESSION:  Check orthotic as needed again and talk about wear schedule since he reported incorrect schedule last visit, check HEP and encourage fnl use of arm/hand. Would benefit from manual therapy and stretches, etc as well.    [address] Baltasar Twilley, [place] 12/17/2022, 11:20 AM

## 2022-12-24 ENCOUNTER — Ambulatory Visit: Payer: Medicare Other

## 2022-12-24 DIAGNOSIS — R278 Other lack of coordination: Secondary | ICD-10-CM

## 2022-12-24 DIAGNOSIS — I69351 Hemiplegia and hemiparesis following cerebral infarction affecting right dominant side: Secondary | ICD-10-CM

## 2022-12-24 DIAGNOSIS — R2681 Unsteadiness on feet: Secondary | ICD-10-CM

## 2022-12-24 NOTE — Therapy (Signed)
OUTPATIENT OCCUPATIONAL THERAPY TREATMENT NOTE AND DISCHARGE SUMMARY  Patient Name: [name] [name] MRN: [id number] DOB:[birth year], 67 y.o., male Today's Date: 12/24/2022  PCP: Renaye Rakers, MD REFERRING PROVIDER: Renaye Rakers, MD  OCCUPATIONAL THERAPY DISCHARGE SUMMARY  Visits from Start of Care: 4  Current functional level related to goals / functional outcomes: Pt remains at baseline level of functioning without gains this episode.   Remaining deficits: RUE impairments   Education / Equipment: HEP, memory compensation strategies, splint   Patient agrees to discharge. Patient goals were not met. Patient is being discharged due to lack of progress due to minimal carryovr of treatment strategies, and not performing as instructed.   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   Dysphagia    Enlarged prostate    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   Hypertension    Past Surgical History:  Procedure Laterality Date   ESOPHAGOSCOPY W/ PERCUTANEOUS GASTROSTOMY TUBE PLACEMENT  04/12/2013   Dr Murrell Converse, [facility]   Patient Active Problem List   Diagnosis Date Noted   Right spastic hemiplegia (HCC) 09/29/2017   OSA (obstructive sleep apnea) 10/11/2014   Excessive daytime sleepiness 10/11/2014   Hemiparesis (HCC) 10/11/2014   Dysarthria 10/11/2014   Pneumoperitoneum 05/01/2013   Lower  GI bleed 05/01/2013   UTI (urinary tract infection) 05/01/2013   Supratherapeutic INR 05/01/2013   Chronic indwelling Foley catheter 05/01/2013   Jejunostomy tube present (?originally G tube?) 05/01/2013   Laceration of eyebrow s/p repair 04/26/2013 05/01/2013   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   Hemiparesis affecting right side as late effect of stroke (HCC) 05/01/2013   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/01/2013   DVT of right axillary vein, acute (HCC) 05/01/2013   CVA (cerebral vascular accident) (HCC) 05/01/2013   Anemia 05/01/2013   Dysphagia     ONSET DATE: 09/29/2022  REFERRING DIAG: Diagnosis G81.91 (ICD-10-CM) - Hemiplegia, unspecified affecting right dominant side  THERAPY DIAG:  Hemiplegia and hemiparesis following cerebral infarction affecting right dominant side (HCC)  Other lack of coordination  Unsteadiness on feet  Rationale for Evaluation and Treatment: Rehabilitation  PERTINENT HISTORY: CVA 2014 w/ residual hemiplegia, HTN  PRECAUTIONS: Fall; WEIGHT BEARING RESTRICTIONS: No   SUBJECTIVE:   SUBJECTIVE STATEMENT: Pt reports doing well since last visit. Pt showing OT digit re-trainer for strength and demonstrating use.   PAIN:  Are you having pain? No  FALLS: Has patient fallen in last 6 months? No, but near falls  LIVING ENVIRONMENT: Lives with: lives alone (2 brothers lives within 20 minutes) Lives in: [place] in 1 story home, 1 step to enter Has following equipment at home: Single point cane, shower chair, and Ramped entry  PLOF: Independent  PATIENT GOALS: Not  sure   OBJECTIVE: (All objective assessments below are from initial evaluation on: 10/16/22 unless otherwise specified.)   HAND DOMINANCE:  was Rt handed prior to stroke, but Lt hand dominant last 10 years  ADLs: Eating: mod I Grooming: mod I  UB Dressing: mod I   LB Dressing: mod I  Toileting: mod I  Bathing: mod I  Tub Shower transfers: walk in shower w/  shower chair   IADLs: Shopping: mod I (relies on others for transportation)  Light housekeeping: mod I  Meal Prep: mod I w/ Air Geophysicist/field seismologist mobility: [employer] transportation or relies on family Medication management: mod I  Landscape architect: - auto pay for most bills, brother assist for other things Handwriting:  uses Lt hand  MOBILITY STATUS:  walks w/ cane  UPPER EXTREMITY ROM:  LUE AROM WNL's RUE: minimal shoulder flex, goes into synergy pattern (sh abduction and elbow flex), elbow ext up to 90% - difficult d/t spasticity, sup to neutral, Full composite hand flexion 90%, 25% finger ext w/ passive movement difficulty (especially at PIP joints) d/t spasticity unless in wrist flexion.  Full P/ROM Rt shoulder flexion  HAND FUNCTION: Use Rt hand as stabilizer  COORDINATION: Pt can place things in Rt hand to hold as stabilizer to gross assist but cannot release I'ly  SENSATION: Not tested  EDEMA: mild Rt hand  MUSCLE TONE: RUE: Moderate and Hypertonic  COGNITION: Overall cognitive status:  decreased awareness, tangential   VISION: Subjective report: I am going to the eye doctor in a couple weeks Baseline vision: Wears glasses for reading only  OBSERVATIONS: pt easily gets off topic and needs to be redirected, decreased understanding of spasticity from stroke  12/24/22: Pt not wearing splint today. He did not bring today, and reports wearing sometimes at night, but not doing it 100%   TODAY'S TREATMENT:                                                                                                                              Pt again today asking a variety of health questions, some related to RUE and others not. Frequent redirection needed. Discussing outcome of chronic stroke and affected extremity results again today. Pt seems to have difficulty accepting this. Pt discussing topics such as how his hand can move better when laying down, frequent  urination at night, chronic low back pain, chronic knee pain, skin discoloration at right forearm. Pt frequently asking OT to address topics from 10 years ago stating many of his issues started then. OT consistently instructing pt to address with PCP as needed. OT addressing custom fabricated splint again due to patient's inability to report correct wear schedule. Pt to wear at night and sometimes in day, if he wishes to stretch. Pt encouraged to use RUE as much as possible versus keeping it in the splint all the time.  OT having pt perform exercises as listed below from forearm supination  stretch to pushing knuckles down requiring intermittent tactile cues and frequent redirection to task as pt asking how to "re-strengthen" his arm despite education for goal of this HEP. Review of HEP to reinforce importance of actively using RUE. Pt requiring review of HEP given he has not performed exercises since initiated.  Exercises - Seated Shoulder Flexion Towel Slide at Table Top  - 4 x daily - 3-5 reps - 15 hold - Seated Shoulder Abduction Towel Slide at Table Top  - 3-4 x daily - 3-5 reps - 15 hold - Seated Shoulder External Rotation PROM on Table  - 3-4 x daily - 3-5 reps - 15 sec hold - Standing Elbow Flexion Extension AROM  - 4-6 x daily - 10-15 reps - Forearm Supination Stretch  - 3-4 x daily - 3-5 reps - 15 sec hold - Forearm Pronation Stretch  - 3-4 x daily - 3-5 reps - 15 sec hold - Turn [employer] Facing Up & Down  - 4-6 x daily - 10-15 reps - Wrist Flexion Stretch  - 4 x daily - 3-5 reps - 15 sec hold - Wrist Extension Stretch Pronated  - 4 x daily - 3-5 reps - 15 hold - Bend and Pull Back Wrist SLOWLY  - 4 x daily - 10-15 reps - Finger Spreading  - 4-6 x daily - 10-15 reps - PUSH KNUCKLES DOWN  - 4 x daily - 3-5 reps - 15 seconds hold - Tendon Glides  - 4-6 x daily - 3-5 reps - 2-3 seconds hold  Pt provided with handout for adaptive equipment for cooking utensils. Pt also provided handout for  memory strategies. Pt encouraged to write down "do exercises" on a post-it, and place it where he sees it daily to recall to perform exercises.  PATIENT EDUCATION: Education details: D/C from OT services, advocating for self with PCP of his concerns, memory strategies Person educated: Patient Education method: Explanation, Demonstration, and Handouts Education comprehension: verbalized understanding  HOME EXERCISE PROGRAM: Access Code: [email or web address] URL: [email or web address] Date: 12/03/2022 Prepared by: Fannie Knee  12/24/22: AE cooking utensils, memory strategies  GOALS: Goals reviewed with patient? Yes  SHORT TERM GOALS: Target date: 11/16/22  Independent with HEP  Baseline: 12/24/22: Pt unable to recall exercises, similar to last visit as well.  Goal status: NOT MET  2.  Independent with resting hand splint wear and care Baseline:  12/24/22: Pt reports again wearing splint every day, frequently versus the scheduled discussed at every visit. Goal status: NOT MET  3.  Pt to verbalize understanding with bed positioning to help prevent pain Baseline:  12/24/22: Pt reports having the sheet of sleep examples, but hasn't done it. Pt now denies pain while sleeping. Goal status: NOT MET   LONG TERM GOALS: Target date: 12/16/22  Pt to verbalize understanding with A/E for cooking to increase safety and ease Baseline:  12/24/22: Handout provided for website of items. Goal status: NOT MET  2.  Pt to verbalize understanding with contact info for driver evaluation and adaptations for operating vehicle with Lt side Baseline:  12/24/22: Info provided last visit. Pt reports not calling, but understanding how to Goal status: MET   ASSESSMENT:  CLINICAL IMPRESSION: Pt tangential during treatment again today. Education provided for D/C from OT services given lack of progress and goals not being met with therapy. Pt verbalizes being non-compliant with HEP, and unable to recall  correct wear schedule for splint. Pt continues to struggle with the effects from having a stroke  10 years ago, and consistently ask questions of "why do you think that is" despite education for progression and outcomes of chronic strokes. During this episode, pt provided with splint, compensatory strategies, and HEP to address functional use of RUE for daily activity. Pt receptive to discharge from services.   PLAN:  OT FREQUENCY: 1x/week  OT DURATION: 8 weeks (plus eval)   PLANNED INTERVENTIONS: self care/ADL training, therapeutic exercise, therapeutic activity, neuromuscular re-education, manual therapy, passive range of motion, functional mobility training, splinting, electrical stimulation, moist heat, cryotherapy, patient/family education, cognitive remediation/compensation, energy conservation, and DME and/or AE instructions  CONSULTED AND AGREED WITH PLAN OF CARE: Patient  PLAN FOR NEXT SESSION:  D/C OT services   [place] Lindzie Boxx, OT 12/24/2022, 11:43 AM

## 2022-12-24 NOTE — Patient Instructions (Signed)
   [email or web address]  Memory Compensation Strategies  Use "WARM" strategy. W= write it down A=  associate it R=  repeat it M=  make a mental picture  You can keep a Memory Notebook. Use a 3-ring notebook with sections for the following:  calendar, important names and phone numbers, medications, doctors' names/phone numbers, "to do list"/reminders, and a section to journal what you did each day  Use a calendar to write appointments down.  Write yourself a schedule for the day.  This can be placed on the calendar or in a separate section of the Memory Notebook.  Keeping a regular schedule can help memory.  Use medication organizer with sections for each day or morning/evening pills  You may need help loading it  Keep a basket, or pegboard by the door.   Place items that you need to take out with you in the basket or on the pegboard.  You may also want to include a message board for reminders.  Use sticky notes. Place sticky notes with reminders in a place where the task is performed.  For example:  "turn off the stove" placed by the stove, "lock the door" placed on the door at eye level, "take your medications" on the bathroom mirror or by the place where you normally take your medications  Use alarms, timers, and/or a reminder app. Use while cooking to remind yourself to check on food or as a reminder to take your medicine, or as a reminder to make a call, or as a reminder to perform another task, etc.  Use a voice recorder app or small tape recorder to record important information and notes for yourself. Go back at the end of the day and listen to these.

## 2024-03-09 ENCOUNTER — Ambulatory Visit
Admission: RE | Admit: 2024-03-09 | Discharge: 2024-03-09 | Disposition: A | Discharge status: Home / Self Care | Source: Ambulatory Visit | Attending: Family Medicine | Admitting: Family Medicine

## 2024-03-09 ENCOUNTER — Other Ambulatory Visit: Payer: Self-pay | Admitting: Family Medicine

## 2024-03-09 DIAGNOSIS — R0789 Other chest pain: Secondary | ICD-10-CM

## 2024-04-25 DIAGNOSIS — R2689 Other abnormalities of gait and mobility: Secondary | ICD-10-CM | POA: Diagnosis not present

## 2024-04-25 DIAGNOSIS — M545 Low back pain, unspecified: Secondary | ICD-10-CM | POA: Diagnosis not present

## 2024-04-25 DIAGNOSIS — M25511 Pain in right shoulder: Secondary | ICD-10-CM | POA: Diagnosis not present

## 2024-05-05 DIAGNOSIS — R2689 Other abnormalities of gait and mobility: Secondary | ICD-10-CM | POA: Diagnosis not present

## 2024-05-05 DIAGNOSIS — M25511 Pain in right shoulder: Secondary | ICD-10-CM | POA: Diagnosis not present

## 2024-05-05 DIAGNOSIS — M545 Low back pain, unspecified: Secondary | ICD-10-CM | POA: Diagnosis not present

## 2024-05-12 DIAGNOSIS — M545 Low back pain, unspecified: Secondary | ICD-10-CM | POA: Diagnosis not present

## 2024-05-12 DIAGNOSIS — M25511 Pain in right shoulder: Secondary | ICD-10-CM | POA: Diagnosis not present

## 2024-05-12 DIAGNOSIS — R2689 Other abnormalities of gait and mobility: Secondary | ICD-10-CM | POA: Diagnosis not present

## 2024-05-17 DIAGNOSIS — R2689 Other abnormalities of gait and mobility: Secondary | ICD-10-CM | POA: Diagnosis not present

## 2024-05-17 DIAGNOSIS — M545 Low back pain, unspecified: Secondary | ICD-10-CM | POA: Diagnosis not present

## 2024-05-17 DIAGNOSIS — M25511 Pain in right shoulder: Secondary | ICD-10-CM | POA: Diagnosis not present

## 2024-05-18 DIAGNOSIS — Z23 Encounter for immunization: Secondary | ICD-10-CM | POA: Diagnosis not present

## 2024-05-18 DIAGNOSIS — M130 Polyarthritis, unspecified: Secondary | ICD-10-CM | POA: Diagnosis not present

## 2024-05-18 DIAGNOSIS — G8190 Hemiplegia, unspecified affecting unspecified side: Secondary | ICD-10-CM | POA: Diagnosis not present

## 2024-05-18 DIAGNOSIS — E1169 Type 2 diabetes mellitus with other specified complication: Secondary | ICD-10-CM | POA: Diagnosis not present

## 2024-05-18 DIAGNOSIS — F5102 Adjustment insomnia: Secondary | ICD-10-CM | POA: Diagnosis not present

## 2024-05-20 DIAGNOSIS — R2689 Other abnormalities of gait and mobility: Secondary | ICD-10-CM | POA: Diagnosis not present

## 2024-05-20 DIAGNOSIS — M25511 Pain in right shoulder: Secondary | ICD-10-CM | POA: Diagnosis not present

## 2024-05-20 DIAGNOSIS — M545 Low back pain, unspecified: Secondary | ICD-10-CM | POA: Diagnosis not present

## 2024-05-24 DIAGNOSIS — R2689 Other abnormalities of gait and mobility: Secondary | ICD-10-CM | POA: Diagnosis not present

## 2024-05-24 DIAGNOSIS — M545 Low back pain, unspecified: Secondary | ICD-10-CM | POA: Diagnosis not present

## 2024-05-24 DIAGNOSIS — M25511 Pain in right shoulder: Secondary | ICD-10-CM | POA: Diagnosis not present

## 2024-06-07 DIAGNOSIS — M25511 Pain in right shoulder: Secondary | ICD-10-CM | POA: Diagnosis not present

## 2024-06-07 DIAGNOSIS — M545 Low back pain, unspecified: Secondary | ICD-10-CM | POA: Diagnosis not present

## 2024-06-07 DIAGNOSIS — R2689 Other abnormalities of gait and mobility: Secondary | ICD-10-CM | POA: Diagnosis not present

## 2024-06-09 DIAGNOSIS — M25511 Pain in right shoulder: Secondary | ICD-10-CM | POA: Diagnosis not present

## 2024-06-09 DIAGNOSIS — R2689 Other abnormalities of gait and mobility: Secondary | ICD-10-CM | POA: Diagnosis not present

## 2024-06-09 DIAGNOSIS — M545 Low back pain, unspecified: Secondary | ICD-10-CM | POA: Diagnosis not present

## 2024-08-12 ENCOUNTER — Encounter: Payer: Self-pay | Admitting: Physical Therapy

## 2024-08-12 ENCOUNTER — Other Ambulatory Visit: Payer: Self-pay

## 2024-08-12 ENCOUNTER — Ambulatory Visit: Attending: Family Medicine | Admitting: Physical Therapy

## 2024-08-12 DIAGNOSIS — I69351 Hemiplegia and hemiparesis following cerebral infarction affecting right dominant side: Secondary | ICD-10-CM | POA: Diagnosis present

## 2024-08-12 DIAGNOSIS — R278 Other lack of coordination: Secondary | ICD-10-CM | POA: Diagnosis present

## 2024-08-12 DIAGNOSIS — M6281 Muscle weakness (generalized): Secondary | ICD-10-CM | POA: Diagnosis present

## 2024-08-12 DIAGNOSIS — R2681 Unsteadiness on feet: Secondary | ICD-10-CM | POA: Diagnosis present

## 2024-08-12 DIAGNOSIS — R2689 Other abnormalities of gait and mobility: Secondary | ICD-10-CM | POA: Diagnosis present

## 2024-08-12 NOTE — Therapy (Signed)
 " OUTPATIENT PHYSICAL THERAPY NEURO EVALUATION   Patient Name: [name] [name] MRN: [id number] DOB:1955-08-22, 69 y.o., male Today's Date: 08/12/2024   PCP: Benjamine Aland, MD REFERRING PROVIDER: Benjamine Aland, MD  END OF SESSION:  PT End of Session - 08/12/24 1025     Visit Number 1    Number of Visits 9   8 visits plus Eval   Date for Recertification  09/23/24   for scheduling delays   PT Start Time 1031   Pt arrived late, needed to finish checking in, and then requested to use bathroom prior to PT evaluation.   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   Hypertension    Past Surgical History:  Procedure Laterality Date   ESOPHAGOSCOPY W/ PERCUTANEOUS GASTROSTOMY TUBE PLACEMENT  04/12/2013   Dr Kennith, [facility]   Patient Active Problem List   Diagnosis Date Noted   Right spastic hemiplegia (HCC) 09/29/2017   OSA (obstructive sleep apnea) 10/11/2014   Excessive daytime sleepiness 10/11/2014   Hemiparesis (HCC) 10/11/2014   Dysarthria 10/11/2014   Pneumoperitoneum 05/01/2013   Lower GI bleed 05/01/2013   UTI (urinary tract infection) 05/01/2013   Supratherapeutic INR 05/01/2013   Chronic indwelling Foley catheter 05/01/2013   Jejunostomy tube present (?originally G tube?) 05/01/2013   Laceration of eyebrow s/p repair 04/26/2013 05/01/2013   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   Hemiparesis affecting right side as late effect of stroke (HCC) 05/01/2013   Dysarthria as late  effect of stroke 05/01/2013   DVT of right axillary vein, acute (HCC) 05/01/2013   CVA (cerebral vascular accident) (HCC) 05/01/2013   Anemia 05/01/2013   Dysphagia     ONSET DATE: 07/19/2024 (date of referral)  REFERRING DIAG: G81.91 (ICD-10-CM) - Hemiplegia affecting right dominant side (HCC)  THERAPY DIAG:  Other abnormalities of gait and mobility  Hemiplegia and hemiparesis following cerebral infarction affecting right dominant side (HCC)  Other lack of coordination  Unsteadiness on feet  Muscle weakness (generalized)  Rationale for Evaluation and Treatment: Rehabilitation  SUBJECTIVE:  SUBJECTIVE STATEMENT: Pt arrived with SPC and carrying white garbage bag.  Has PCP appt (wellness visit) next week.  Pt reports just finishing physical therapy at [facility] (discharged from PT in 2025) and was told to come here for OP PT.  Pt focusing on needing therapy to clear him for driving and to work on R arm function (and brace): pt educated to ask PCP for OT referral to address concerns.  Pt also reports concerns with R LE weakness and walking. Pt accompanied by: self (used transportation company)  PERTINENT HISTORY: Per Dr. 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  No recent PCP visits found in system (last visits noted 03/09/2024); pt reports wellness PCP visit next week. PMH includes CVA (large L pontine ischemic stroke 2014) with dysarthria and residual R spastic hemiplegia, dysphagia, UTI, htn, h/o R UE DVT, h/o percutaneous gastrostomy tube placement 2014, OSA, chronic indwelling foley catheter 2014, anemia, L de Quervain's tenosynovitis.  PAIN:  Are you having pain? No  PRECAUTIONS: Fall  RED FLAGS: None   WEIGHT BEARING RESTRICTIONS: No  FALLS: Has patient fallen in last 6 months? No  LIVING ENVIRONMENT: Lives with: lives alone Lives in: House/apartment Stairs: Yes: External: 3 steps; can reach both Has following equipment at home: Single point cane, Walker - 2 wheeled, Wheelchair (manual), shower chair, bed side commode, and Grab bars  PLOF: Independent with basic ADLs.  Uses SPC and wears solid posterior AFO R LE.  PATIENT GOALS: To be able to drive again.  Wants to be able to climb a ladder, walk around block, and jogging.  Wants to transition away from [facility] use.  OBJECTIVE:  Note: Objective measures were completed at Evaluation unless otherwise noted.  DIAGNOSTIC FINDINGS:  Per Dr. 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  COGNITION: Overall cognitive status: Difficulty to assess due to: pt tangential and not clearly answering questions.  Oriented to at least name, DOB, and  place.   SENSATION: Not tested  COORDINATION: Decreased R>L LE heel to shin in sitting (L LE complicated d/t L LE weakness)  EDEMA:  R UE edema noted (wearing brace) R LE limited assessment d/t wearing AFO and time restraints  MUSCLE TONE: R LE tone appearing WFL at hips and knees (ankle not assessed d/t R AFO)  POSTURE: rounded shoulders and forward head  LOWER EXTREMITY MMT:    MMT Right Eval Left Eval  Hip flexion 4-/5 5/5  Hip extension    Hip abduction  Hip adduction    Hip internal rotation    Hip external rotation    Knee flexion 4-/5 4+/5  Knee extension 4/5 4+/5  Ankle dorsiflexion 1/5 (R AFO) 4+/5  Ankle plantarflexion    Ankle inversion    Ankle eversion    (Blank rows = not tested)   TRANSFERS: Sit to stand: Modified independence  Assistive device utilized: None     Stand to sit: Modified independence  Assistive device utilized: None      GAIT: Findings: Gait Characteristics: Increased L lateral lean to advance R LE; R LE appearing stiff with minimal R knee flexion during R LE advancement; decreased R LE step length and foot clearance; decreased R heel-strike (pt wearing R AFO); mild unsteadiness without use of SPC but pt able to maintain balance, Distance walked: clinic distances, Assistive device utilized:Single point cane and no AD use, Level of assistance: Modified independence, SBA, and (mod I with SPC use; SBA no AD use), and Comments: decreased gait speed  FUNCTIONAL TESTS:  5 times sit to stand: 17.50 seconds (2 reps with L UE support and rest no UE support) 10 meter walk test: 0.535 m/sec (18.66 seconds; no AD use per pt request)  PATIENT SURVEYS:  TBA                                                                                                                              TREATMENT DATE: 08/12/24    PATIENT EDUCATION: Education details: Eval results; POC; physical therapy goals.  Pt to request OT referral from PCP at planned PCP visit  next week. Person educated: Patient Education method: Explanation and Verbal cues Education comprehension: verbalized understanding, verbal cues required, and needs further education  HOME EXERCISE PROGRAM: TBA  GOALS: Goals reviewed with patient? Yes   SHORT TERM GOALS = LONG TERM GOALS (d/t length of plan of care): Target date: 09/09/2024  Pt will be at least 50% compliant with HEP in order to improve strength and balance in order to decrease fall risk and improve function at home for ADL's. Baseline: TBA Goal status: INITIAL  2.  Pt will decrease 5 Time Sit to Stand by at least 2 seconds in order to demonstrate improvement in LE strength. Baseline: 17.50 seconds on Eval Goal status: INITIAL  3.  Pt will increase by at least 0.13 m/s in order to demonstrate clinically significant improvement in community ambulation.  Baseline: 0.535 m/sec on Eval Goal status: INITIAL  4.  Pt will improve BERG by at least 3 points in order to demonstrate clinically significant improvement in balance. Baseline: TBA Goal status: INITIAL   ASSESSMENT:  CLINICAL IMPRESSION: Patient is a 69 y.o. male who was seen today for physical therapy evaluation and treatment for h/o CVA (2014).  Pt arrived late (has transportation assist), needed to finish checking in, and then needed to use restroom limiting time for evaluation.  Patient presents with R UE/LE weakness (wears R  UE brace and R LE AFO), impaired balance and LE coordination, and transfer and gait impairments . These impairments are limiting patient from ADL's and community activities.  Pt tangential and often not specific with answers during session.  Evaluation included the following assessment tools: 5 time sit to stand and .  Pt scored 17.50 seconds on the 5 time sit to stand test indicating pt is at increased risk of falls (>15 seconds = increased risk of falls).  Pt scored 0.535 m/sec on the 10 Meter Walk Test indicating pt is a  limited community ambulator (Cut off scores: <0.4 m/s = household Ambulator, 0.4-0.8 m/s = limited community Ambulator, >0.8 m/s = community Ambulator, >1.2 m/s = crossing a street, <1.0 = increased fall risk).  Therapist discussed pt's personal goals compared to what physical therapist would be able to address in physical therapy (focus on LE strengthening, balance, and gait to improve overall function); pt verbalizing agreement with PT POC and physical therapy goals.  Patient will benefit from skilled PT to address noted impairments, improve overall function, and progress towards long term goals.  Educated pt to ask PCP for OT referral to address R UE concerns (pt reports he has PCP visit next week and will ask then).  OBJECTIVE IMPAIRMENTS: Abnormal gait, decreased activity tolerance, decreased balance, decreased cognition, decreased coordination, decreased endurance, decreased knowledge of condition, decreased knowledge of use of DME, decreased mobility, difficulty walking, decreased ROM, decreased strength, decreased safety awareness, impaired flexibility, impaired tone, impaired UE functional use, improper body mechanics, and postural dysfunction.   ACTIVITY LIMITATIONS: carrying, lifting, bending, standing, squatting, stairs, transfers, bed mobility, bathing, toileting, dressing, locomotion level, and caring for others  PARTICIPATION LIMITATIONS: meal prep, cleaning, laundry, driving, shopping, community activity, occupation, and yard work  PERSONAL FACTORS: Age, Behavior pattern, Education, Fitness, Past/current experiences, Time since onset of injury/illness/exacerbation, Transportation, and 1 comorbidity: CVA; htn are also affecting patient's functional outcome.   REHAB POTENTIAL: Fair d/t extended time since CVA  CLINICAL DECISION MAKING: Stable/uncomplicated  EVALUATION COMPLEXITY: Low  PLAN:  PT FREQUENCY: 2x/week  PT DURATION: 4 weeks  PLANNED INTERVENTIONS: [id number]- PT  Re-evaluation, 97750- Physical Performance Testing, 97110-Therapeutic exercises, 97530- Therapeutic activity, [id number]- Neuromuscular re-education, 97535- Self Care, [id number]- Manual therapy, [id number]- Gait training, [phone]- Orthotic Initial, [phone]- Orthotic/Prosthetic subsequent, [phone]- Aquatic Therapy, [phone]- Splinting, [phone]- Electrical stimulation (manual), [phone]- Ultrasound, Patient/Family education, Balance training, Stair training, Taping, Joint mobilization, Spinal mobilization, Compression bandaging, Cognitive remediation, DME instructions, Cryotherapy, Moist heat, and Biofeedback  PLAN FOR NEXT SESSION: Assess BERG Balance test.  Issue HEP.  LE strengthening; balance, coordination.  Increase R LE WB'ing with transfers. Gait.   Damien Caulk, PT 08/12/2024, 12:03 PM        "

## 2024-08-24 ENCOUNTER — Ambulatory Visit: Admitting: Physical Therapy

## 2024-08-26 ENCOUNTER — Encounter: Payer: Self-pay | Admitting: Physical Therapy

## 2024-08-26 ENCOUNTER — Telehealth: Payer: Self-pay | Admitting: Physical Therapy

## 2024-08-26 ENCOUNTER — Ambulatory Visit: Admitting: Physical Therapy

## 2024-08-26 VITALS — BP 151/69 | HR 61

## 2024-08-26 DIAGNOSIS — R2689 Other abnormalities of gait and mobility: Secondary | ICD-10-CM | POA: Diagnosis not present

## 2024-08-26 DIAGNOSIS — M6281 Muscle weakness (generalized): Secondary | ICD-10-CM

## 2024-08-26 DIAGNOSIS — R278 Other lack of coordination: Secondary | ICD-10-CM

## 2024-08-26 DIAGNOSIS — I69351 Hemiplegia and hemiparesis following cerebral infarction affecting right dominant side: Secondary | ICD-10-CM

## 2024-08-26 DIAGNOSIS — R2681 Unsteadiness on feet: Secondary | ICD-10-CM

## 2024-08-26 NOTE — Therapy (Signed)
 " OUTPATIENT PHYSICAL THERAPY NEURO TREATMENT   Patient Name: [name] [name] MRN: [id number] DOB:25-Jan-1956, 69 y.o., male Today's Date: 08/26/2024   PCP: Benjamine Aland, MD REFERRING PROVIDER: Benjamine Aland, MD  END OF SESSION:  PT End of Session - 08/26/24 1028     Visit Number 2    Number of Visits 9   8 visits plus Eval   Date for Recertification  09/23/24   for scheduling delays   Authorization Type [employer]    Authorization Time Period 08/12/24 - 09/09/24 9 PT visits    Authorization - Visit Number 2    Authorization - Number of Visits 9    PT Start Time 1032   pt arrived late--checked in at 1023 AM (has transportation assist) and then needing to use bathroom prior to starting therapy (d/t this unable to start therapy until 1032 AM)   PT Stop Time 1102    PT Time Calculation (min) 30 min    Equipment Utilized During Treatment Gait belt    Activity Tolerance Patient tolerated treatment well    Behavior During Therapy --   Tangential; requires redirection         Past Medical History:  Diagnosis Date   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   Depression    DVT (deep venous thrombosis) (HCC)    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/01/2013   Dysphagia    Enlarged prostate    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   Hypertension    Past Surgical History:  Procedure Laterality Date   ESOPHAGOSCOPY W/ PERCUTANEOUS GASTROSTOMY TUBE PLACEMENT  04/12/2013   Dr Kennith, [facility]   Patient Active Problem List   Diagnosis Date Noted   Right spastic hemiplegia (HCC) 09/29/2017   OSA (obstructive sleep apnea) 10/11/2014   Excessive daytime sleepiness 10/11/2014   Hemiparesis (HCC) 10/11/2014   Dysarthria 10/11/2014   Pneumoperitoneum 05/01/2013   Lower GI bleed 05/01/2013   UTI (urinary tract infection) 05/01/2013   Supratherapeutic INR 05/01/2013   Chronic indwelling Foley catheter 05/01/2013   Jejunostomy tube present  (?originally G tube?) 05/01/2013   Laceration of eyebrow s/p repair 04/26/2013 05/01/2013   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   Hemiparesis affecting right side as late effect of stroke (HCC) 05/01/2013   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/01/2013   DVT of right axillary vein, acute (HCC) 05/01/2013   CVA (cerebral vascular accident) (HCC) 05/01/2013   Anemia 05/01/2013   Dysphagia     ONSET DATE: 07/19/2024 (date of referral)  REFERRING DIAG: G81.91 (ICD-10-CM) - Hemiplegia affecting right dominant side (HCC)  THERAPY DIAG:  Other abnormalities of gait and mobility  Hemiplegia and hemiparesis following cerebral infarction affecting right dominant side (HCC)  Other lack of coordination  Unsteadiness on feet  Muscle weakness (generalized)  Rationale for Evaluation and Treatment: Rehabilitation  SUBJECTIVE:  SUBJECTIVE STATEMENT: Pt arrived late for therapy (has transportation assist) and then needing to use bathroom.  Started therapy session and then pt briefly left to go back to bathroom d/t forgetting something in bathroom.  Pt thought he was here for Occupational Therapy but agreeable to work with physical therapy (therapist educated pt on PT evaluation and POC/goals).  Pt reports missing last appt d/t transportation issues.  Pt reports seeing PCP and asking for OT referral.  No recent falls reported.  No acute changes reported.  Pt reports everything went well with recent PCP visit. Pt accompanied by: self (used transportation company)  PERTINENT HISTORY: Per Dr. 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  No recent PCP visits found in system (last visits noted 03/09/2024); pt reports wellness PCP visit next week. PMH includes CVA (large L pontine ischemic stroke 2014) with dysarthria and residual R spastic hemiplegia, dysphagia, UTI, htn, h/o R UE DVT, h/o percutaneous gastrostomy tube placement 2014, OSA, chronic indwelling foley catheter 2014, anemia, L de Quervain's tenosynovitis.  PAIN:  Are you having pain? No  PRECAUTIONS: Fall  RED FLAGS: None   WEIGHT BEARING RESTRICTIONS: No  FALLS: Has patient fallen in last 6 months? No  LIVING ENVIRONMENT: Lives with: lives alone Lives in: House/apartment Stairs: Yes: External: 3 steps; can reach both Has following equipment at home: Single point cane, Walker - 2 wheeled, Wheelchair (manual), shower chair, bed side commode, and Grab bars  PLOF: Independent with basic ADLs.  Uses SPC and wears solid posterior AFO R LE.  PATIENT GOALS: To be able to drive again.  Wants to be able to climb a ladder, walk around block, and jogging.  Wants to transition away from [facility] use.  OBJECTIVE:  Note: Objective measures were completed at Evaluation unless otherwise noted.  DIAGNOSTIC FINDINGS:  Per Dr. 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  COGNITION: Overall cognitive status: Difficulty to assess due to: pt tangential and not clearly answering questions.  Oriented to at least name, DOB, and place.   SENSATION: Not tested  COORDINATION: Decreased R>L LE heel to shin in sitting (L LE complicated d/t L LE weakness)  EDEMA:  R UE edema noted (wearing brace) R LE limited assessment d/t wearing AFO and time restraints  MUSCLE TONE: R LE tone appearing WFL at hips and knees (ankle not assessed d/t R AFO)  POSTURE: rounded shoulders and forward head  LOWER EXTREMITY MMT:    MMT Right Eval Left Eval  Hip flexion  4-/5 5/5  Hip extension    Hip abduction    Hip adduction    Hip internal rotation    Hip external rotation    Knee flexion 4-/5 4+/5  Knee extension 4/5 4+/5  Ankle dorsiflexion 1/5 (R AFO) 4+/5  Ankle plantarflexion    Ankle inversion    Ankle eversion    (Blank rows = not tested)   TRANSFERS: Sit to stand: Modified independence  Assistive device utilized: None     Stand to sit: Modified independence  Assistive device utilized: None      GAIT: Findings: Gait Characteristics: Increased L lateral lean to advance R LE; R LE appearing stiff with minimal R knee flexion during R LE advancement; decreased R LE step length and foot clearance; decreased R heel-strike (pt wearing R AFO); mild unsteadiness without use of SPC but pt able to maintain balance, Distance walked: clinic distances, Assistive device utilized:Single point cane and no AD use, Level of assistance: Modified independence, SBA, and (mod I with SPC use; SBA no AD use), and Comments: decreased gait speed  FUNCTIONAL TESTS:  5 times sit to stand: 17.50 seconds (2 reps with L UE support and rest no UE support) 10 meter walk test: 0.535  m/sec (18.66 seconds; no AD use per pt request)  PATIENT SURVEYS:  TBA                                                                                                                              TREATMENT DATE: 08/26/24  Self Care: BP and HR taken in sitting at rest beginning of session (see below for details).  Pt reports taking BP medication in afternoon. Vitals:   08/26/24 1035  BP: (!) 151/69  Pulse: 61    Therapeutic Activity:  BERG Balance Test (for baseline measure for STG/LTG):  35/56 (see below for detailed report) Item Test date: 08/26/24 Date:  Date:   Sitting to standing 3. able to stand independently using hands Insert SmartPhrase OPRCBERGREEVAL Insert SmartPhrase OPRCBERGREEVAL  2. Standing unsupported 4. able to stand safely for 2 minutes    3. Sitting with back unsupported, feet supported 4. able to sit safely and securely for 2 minutes    4. Standing to sitting 3. controls descent by using hands    5. Pivot transfer  3. able to transfer safely with definite need of hands    6. Standing unsupported with eyes closed 3. able to stand 10 seconds with supervision    7. Standing unsupported with feet together 0. needs help to attain position and unable to hold for 15 seconds; unable to put feet all the way together    8. Reaching forward with outstretched arms while standing 2. can reach forward 5 cm (2 inches) with L UE    9. Pick up object from the floor from standing 3. able to pick up slipper but needs supervision    10. Turning to look behind over  left and right shoulders while standing 3. looks behind one side only, other side shows less weight shift; Less weight shift to R side    11. Turn 360 degrees 1. needs close supervision or verbal cuing    12. Place alternate foot on step or stool while standing unsupported 1. able to complete > 2 steps needs minimal assist    13. Standing unsupported one foot in front 2. able to take small step independently and hold 30 seconds     14. Standing on one leg 3. able to lift leg independently and hold 5-10 seconds; stand on L LE 5.22 seconds     Total Score 35/56 Total Score:    Total Score:    Therapeutic Exercise: Exercises for HEP: Standing Hip Abduction with Counter Support x10 reps B LE's Standing March with Counter Support  x10 reps B LE's   PATIENT EDUCATION: Education details: POC; Goals for PT.  Therapist to follow up with pt's PCP regarding request for OT referral. Person educated: Patient Education method: Explanation, Demonstration, Verbal cues, and Handouts Education comprehension: verbalized understanding, returned demonstration, verbal cues required, and needs further education  HOME EXERCISE PROGRAM: Access Code: [email or web address] URL: [email or web address] Date: 08/26/2024 Prepared by: Damien Caulk  Exercises - Standing Hip Abduction with Counter Support  - 1 x daily - 3-5 x weekly - 1-2 sets - 10 reps - Standing March with Counter Support  - 1 x daily - 3-5 x weekly - 1-2 sets - 10 reps  GOALS: Goals reviewed with patient? Yes   SHORT TERM GOALS = LONG TERM GOALS (d/t length of plan of care): Target date: 09/09/2024  Pt will be at least 50% compliant with HEP in order to improve strength and balance in order to decrease fall risk and improve function at home for ADL's. Baseline: TBA Goal status: INITIAL  2.  Pt will decrease 5 Time Sit to Stand by at least 2 seconds in order to demonstrate improvement in LE strength. Baseline: 17.50 seconds on Eval Goal status: INITIAL  3.  Pt will increase by at least 0.13 m/s in order to demonstrate clinically significant improvement in community ambulation.  Baseline: 0.535 m/sec on Eval Goal status: INITIAL  4.  Pt will improve BERG by at least 3 points in order to demonstrate clinically significant improvement in balance. Baseline: 35/56 08/26/24 Goal status: INITIAL   ASSESSMENT:  CLINICAL IMPRESSION: Patient was seen today  for first follow-up physical therapy treatment session to address outcome measure and HEP.  Pt arrived late (d/t transportation issues) and then needing to use bathroom after checking in for therapy limiting time for session.  Pt thought he was here for OT (pt reports he had requested OT referral from PCP recently) but pt agreeable to PT session.  No current OT referral noted; therapist will attempt to request OT referral from PCP per pt request.  Focused session on assessing BERG Balance test (for outcome measure) and issuing HEP.  Pt scored 35/56 on BERG Balance test indicating pt is at a high fall risk (<40/56 = high fall risk; 40-45/56 = moderate fall risk; >45/56 = low fall risk).  Pt demonstrates appropriate understanding of issued HEP (pt given HEP handout).  They would continue to benefit from skilled PT to address impairments as noted and progress towards long term goals.  OBJECTIVE IMPAIRMENTS: Abnormal gait, decreased activity tolerance, decreased balance, decreased cognition, decreased coordination, decreased endurance, decreased knowledge of condition, decreased knowledge of use of DME, decreased mobility,  difficulty walking, decreased ROM, decreased strength, decreased safety awareness, impaired flexibility, impaired tone, impaired UE functional use, improper body mechanics, and postural dysfunction.   ACTIVITY LIMITATIONS: carrying, lifting, bending, standing, squatting, stairs, transfers, bed mobility, bathing, toileting, dressing, locomotion level, and caring for others  PARTICIPATION LIMITATIONS: meal prep, cleaning, laundry, driving, shopping, community activity, occupation, and yard work  PERSONAL FACTORS: Age, Behavior pattern, Education, Fitness, Past/current experiences, Time since onset of injury/illness/exacerbation, Transportation, and 1 comorbidity: CVA; htn are also affecting patient's functional outcome.   REHAB POTENTIAL: Fair d/t extended time since CVA  CLINICAL DECISION  MAKING: Stable/uncomplicated  EVALUATION COMPLEXITY: Low  PLAN:  PT FREQUENCY: 2x/week  PT DURATION: 4 weeks  PLANNED INTERVENTIONS: 97164- PT Re-evaluation, 97750- Physical Performance Testing, 97110-Therapeutic exercises, 97530- Therapeutic activity, [id number]- Neuromuscular re-education, 97535- Self Care, [id number]- Manual therapy, [id number]- Gait training, [phone]- Orthotic Initial, [phone]- Orthotic/Prosthetic subsequent, [phone]- Aquatic Therapy, [phone]- Splinting, [phone]- Electrical stimulation (manual), [phone]- Ultrasound, Patient/Family education, Balance training, Stair training, Taping, Joint mobilization, Spinal mobilization, Compression bandaging, Cognitive remediation, DME instructions, Cryotherapy, Moist heat, and Biofeedback  PLAN FOR NEXT SESSION: Check on HEP and OT referral.  LE strengthening; balance, coordination.  Increase R LE WB'ing with transfers. Gait.   Damien Caulk, PT 08/26/2024, 12:43 PM        "

## 2024-08-29 ENCOUNTER — Ambulatory Visit: Admitting: Physical Therapy

## 2024-08-29 ENCOUNTER — Telehealth: Payer: Self-pay | Admitting: Physical Therapy

## 2024-08-29 NOTE — Telephone Encounter (Signed)
 Dr. Benjamine, [name] [name] was evaluated by physical therapy on 08/12/24.  The patient would benefit from OT evaluation for residual deficits from CVA.   If you agree, please place an order in [facility] workque in [facility] or fax the order to [phone]. Thank you, Damien Caulk, PT 08/29/24, 1:02 PM   [facility] [address] [place], [place]  [address] Phone:  [phone] Fax:  [phone]  Faxed request for OT evaluation and treatment referral 08/29/24.

## 2024-09-01 ENCOUNTER — Encounter: Payer: Self-pay | Admitting: Physical Therapy

## 2024-09-01 ENCOUNTER — Ambulatory Visit: Admitting: Physical Therapy

## 2024-09-01 VITALS — BP 152/57 | HR 61

## 2024-09-01 DIAGNOSIS — R2689 Other abnormalities of gait and mobility: Secondary | ICD-10-CM

## 2024-09-01 DIAGNOSIS — M6281 Muscle weakness (generalized): Secondary | ICD-10-CM

## 2024-09-01 DIAGNOSIS — R2681 Unsteadiness on feet: Secondary | ICD-10-CM

## 2024-09-01 DIAGNOSIS — R278 Other lack of coordination: Secondary | ICD-10-CM

## 2024-09-01 DIAGNOSIS — I69351 Hemiplegia and hemiparesis following cerebral infarction affecting right dominant side: Secondary | ICD-10-CM

## 2024-09-01 NOTE — Therapy (Signed)
 " OUTPATIENT PHYSICAL THERAPY NEURO TREATMENT   Patient Name: [name] [name] MRN: [id number] DOB:1955-12-09, 69 y.o., male Today's Date: 09/01/2024   PCP: Benjamine Aland, MD REFERRING PROVIDER: Benjamine Aland, MD  END OF SESSION:  PT End of Session - 09/01/24 1112     Visit Number 3    Number of Visits 9   8 visits plus Eval   Date for Recertification  09/23/24   for scheduling delays   Authorization Type [facility] Medicare    Authorization Time Period 08/12/24 - 09/09/24 9 PT visits    Authorization - Visit Number 3    Authorization - Number of Visits 9    PT Start Time 1118   pt arrived late and then needing to use bathroom before starting therapy   PT Stop Time 1148    PT Time Calculation (min) 30 min    Equipment Utilized During Treatment Gait belt    Activity Tolerance Patient tolerated treatment well    Behavior During Therapy --   Tangential; requires redirection         Past Medical History:  Diagnosis Date   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   Depression    DVT (deep venous thrombosis) (HCC)    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/01/2013   Dysphagia    Enlarged prostate    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   Hypertension    Past Surgical History:  Procedure Laterality Date   ESOPHAGOSCOPY W/ PERCUTANEOUS GASTROSTOMY TUBE PLACEMENT  04/12/2013   Dr Kennith, [facility]   Patient Active Problem List   Diagnosis Date Noted   Right spastic hemiplegia (HCC) 09/29/2017   OSA (obstructive sleep apnea) 10/11/2014   Excessive daytime sleepiness 10/11/2014   Hemiparesis (HCC) 10/11/2014   Dysarthria 10/11/2014   Pneumoperitoneum 05/01/2013   Lower GI bleed 05/01/2013   UTI (urinary tract infection) 05/01/2013   Supratherapeutic INR 05/01/2013   Chronic indwelling Foley catheter 05/01/2013   Jejunostomy tube present (?originally G tube?) 05/01/2013   Laceration of eyebrow s/p repair 04/26/2013 05/01/2013   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 05/01/2013   Hemiparesis affecting right side as late effect of stroke (HCC) 05/01/2013   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/01/2013   DVT of right axillary vein, acute (HCC) 05/01/2013   CVA (cerebral vascular accident) (HCC) 05/01/2013   Anemia 05/01/2013   Dysphagia     ONSET DATE: 07/19/2024 (date of referral)  REFERRING DIAG: G81.91 (ICD-10-CM) - Hemiplegia affecting right dominant side (HCC)  THERAPY DIAG:  Other abnormalities of gait and mobility  Hemiplegia and hemiparesis following cerebral infarction affecting right dominant side (HCC)  Other lack of coordination  Unsteadiness on feet  Muscle weakness (generalized)  Rationale for Evaluation and Treatment: Rehabilitation  SUBJECTIVE:  SUBJECTIVE STATEMENT: Pt arrived late for therapy (utilizes transportation assist) and then needing to use bathroom before starting therapy.  Pt asking about OT referral (therapist requested from PCP but did not receive OT referral at time of session).  No recent falls.  No current pain.  Pt reports doing sit to stand ex's but did not verbalize any on current HEP. Pt accompanied by: self (used transportation company)  PERTINENT HISTORY: Per Dr. 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  No recent PCP visits found in system (last visits noted 03/09/2024); pt reports wellness PCP visit next week. PMH includes CVA (large L pontine ischemic stroke 2014) with dysarthria and residual R spastic hemiplegia, dysphagia, UTI, htn, h/o R UE DVT, h/o percutaneous gastrostomy tube placement 2014, OSA, chronic indwelling foley catheter 2014, anemia, L de Quervain's tenosynovitis.  PAIN:  Are you having pain? No  PRECAUTIONS: Fall  RED FLAGS: None   WEIGHT BEARING RESTRICTIONS: No  FALLS: Has patient fallen in last 6 months? No  LIVING ENVIRONMENT: Lives with: lives alone Lives in: House/apartment Stairs: Yes: External: 3 steps; can reach both Has following equipment at home: Single point cane, Walker - 2 wheeled, Wheelchair (manual), shower chair, bed side commode, and Grab bars  PLOF: Independent with basic ADLs.  Uses SPC and wears solid posterior AFO R LE.  PATIENT GOALS: To be able to drive again.  Wants to be able to climb a ladder, walk around block, and jogging.  Wants to transition away from [facility] use.  OBJECTIVE:  Note: Objective measures were completed at Evaluation unless otherwise noted.  DIAGNOSTIC FINDINGS:  Per Dr. 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  COGNITION: Overall cognitive status: Difficulty to assess due to: pt tangential and not clearly answering  questions.  Oriented to at least name, DOB, and place.   SENSATION: Not tested  COORDINATION: Decreased R>L LE heel to shin in sitting (L LE complicated d/t L LE weakness)  EDEMA:  R UE edema noted (wearing brace) R LE limited assessment d/t wearing AFO and time restraints  MUSCLE TONE: R LE tone appearing WFL at hips and knees (ankle not assessed d/t R AFO)  POSTURE: rounded shoulders and forward head  LOWER EXTREMITY MMT:    MMT Right Eval Left Eval  Hip flexion 4-/5 5/5  Hip extension    Hip abduction    Hip adduction    Hip internal rotation    Hip external rotation    Knee flexion 4-/5 4+/5  Knee extension 4/5 4+/5  Ankle dorsiflexion 1/5 (R AFO) 4+/5  Ankle plantarflexion    Ankle inversion    Ankle eversion    (Blank rows = not tested)   TRANSFERS: Sit to stand: Modified independence  Assistive device utilized: None     Stand to sit: Modified independence  Assistive device utilized: None      GAIT: Findings: Gait Characteristics: Increased L lateral lean to advance R LE; R LE appearing stiff with minimal R knee flexion during R LE advancement; decreased R LE step length and foot clearance; decreased R heel-strike (pt wearing R AFO); mild unsteadiness without use of SPC but pt able to maintain balance, Distance walked: clinic distances, Assistive device utilized:Single point cane and no AD use, Level of assistance: Modified independence, SBA, and (mod I with SPC use; SBA no AD use), and Comments: decreased gait speed  FUNCTIONAL TESTS:  5 times sit to stand: 17.50 seconds (2 reps with L UE support and rest no UE support) 10 meter walk test: 0.535 m/sec (18.66 seconds; no AD use per pt request)  PATIENT SURVEYS:  TBA                                                                                                                              TREATMENT DATE: 09/01/24  Self Care: BP and HR taken in sitting at rest beginning of session (see below for details).   Pt reports not taking BP medication yet today (takes in afternoon). Vitals:   09/01/24 1119  BP: (!) 152/57  Pulse: 61  Education regarding OT referral request and therapy appt time concerns (see pt education and clinical impression below for details).  Therapeutic Activity: Sit to stands from mat table (2 inch step under L LE to increase R LE WB'ing/initiation) to improve transfers: x10 reps no UE support Notes: vc's for technique Side stepping in // bars (no UE support): x2 trials to L and x2 trials to R (8 feet each direction) Toe taps to gum drops in // bars (to improve foot clearance and balance): x10 reps x2 sets B LE's Notes: CGA for safety; increased effort R LE (pt utilizing hip hike to clear R LE)   PATIENT EDUCATION: Education details: Therapist sent OT referral request to PCP (did not received OT referral at time of today's appt); educated pt to call to follow up with pt's PCP regarding request for OT referral.  Encouraged pt to perform issued HEP (pt reports still having HEP handout). Person educated: Patient Education method: Explanation, Demonstration, Verbal cues, and Handouts Education comprehension: verbalized understanding, returned demonstration, verbal cues required, and needs further education  HOME EXERCISE PROGRAM: Access Code: [email or web address] URL: [email or web address] Date: 08/26/2024 Prepared by: Damien Caulk  Exercises - Standing Hip Abduction with Counter Support  - 1 x daily - 3-5 x weekly - 1-2 sets - 10 reps - Standing March with Counter Support  - 1 x daily - 3-5 x weekly -  1-2 sets - 10 reps  GOALS: Goals reviewed with patient? Yes   SHORT TERM GOALS = LONG TERM GOALS (d/t length of plan of care): Target date: 09/09/2024  Pt will be at least 50% compliant with HEP in order to improve strength and balance in order to decrease fall risk and improve function at home for ADL's. Baseline: TBA Goal status: INITIAL  2.  Pt will decrease 5  Time Sit to Stand by at least 2 seconds in order to demonstrate improvement in LE strength. Baseline: 17.50 seconds on Eval Goal status: INITIAL  3.  Pt will increase by at least 0.13 m/s in order to demonstrate clinically significant improvement in community ambulation.  Baseline: 0.535 m/sec on Eval Goal status: INITIAL  4.  Pt will improve BERG by at least 3 points in order to demonstrate clinically significant improvement in balance. Baseline: 35/56 08/26/24 Goal status: INITIAL   ASSESSMENT:  CLINICAL IMPRESSION: Patient was seen today for physical therapy treatment to address transfers and balance.  Pt verbalizing concerns regarding limited therapy session time (pt arrived late--pt utilizing transportation assist--and then needing to utilize bathroom before starting therapy limiting therapy session time) and that therapy appt times were not working for him.  Educated pt that he can change therapy appt times with front desk in order to schedule appt times that work better for pt.  Pt asking about update regarding OT referral.  PT educated pt that therapist sent request to PCP for OT referral but has not received OT referral yet; encouraged pt to reach back out to PCP for OT referral (pt verbalizing understanding).  Pt tangential requiring consistent redirection during session in order to work on therapy activities.  Focused session on improving ease of transfers and improving balance.  They would continue to benefit from skilled PT to address impairments as noted and progress towards long term goals.  OBJECTIVE IMPAIRMENTS: Abnormal gait, decreased activity tolerance, decreased balance, decreased cognition, decreased coordination, decreased endurance, decreased knowledge of condition, decreased knowledge of use of DME, decreased mobility, difficulty walking, decreased ROM, decreased strength, decreased safety awareness, impaired flexibility, impaired tone, impaired UE functional use,  improper body mechanics, and postural dysfunction.   ACTIVITY LIMITATIONS: carrying, lifting, bending, standing, squatting, stairs, transfers, bed mobility, bathing, toileting, dressing, locomotion level, and caring for others  PARTICIPATION LIMITATIONS: meal prep, cleaning, laundry, driving, shopping, community activity, occupation, and yard work  PERSONAL FACTORS: Age, Behavior pattern, Education, Fitness, Past/current experiences, Time since onset of injury/illness/exacerbation, Transportation, and 1 comorbidity: CVA; htn are also affecting patient's functional outcome.   REHAB POTENTIAL: Fair d/t extended time since CVA  CLINICAL DECISION MAKING: Stable/uncomplicated  EVALUATION COMPLEXITY: Low  PLAN:  PT FREQUENCY: 2x/week  PT DURATION: 4 weeks  PLANNED INTERVENTIONS: [id number]- PT Re-evaluation, 97750- Physical Performance Testing, 97110-Therapeutic exercises, 97530- Therapeutic activity, [id number]- Neuromuscular re-education, 97535- Self Care, [id number]- Manual therapy, [id number]- Gait training, [phone]- Orthotic Initial, [phone]- Orthotic/Prosthetic subsequent, [phone]- Aquatic Therapy, [phone]- Splinting, [phone]- Electrical stimulation (manual), [phone]- Ultrasound, Patient/Family education, Balance training, Stair training, Taping, Joint mobilization, Spinal mobilization, Compression bandaging, Cognitive remediation, DME instructions, Cryotherapy, Moist heat, and Biofeedback  PLAN FOR NEXT SESSION: Check on HEP and OT referral.  LE strengthening; balance, coordination.  Increase R LE WB'ing with transfers. Gait.   Damien Caulk, PT 09/01/2024, 11:57 AM        "

## 2024-09-03 LAB — COLOGUARD: COLOGUARD: NEGATIVE

## 2024-09-05 ENCOUNTER — Ambulatory Visit: Admitting: Physical Therapy

## 2024-09-08 ENCOUNTER — Ambulatory Visit: Admitting: Physical Therapy

## 2024-09-12 ENCOUNTER — Ambulatory Visit: Admitting: Physical Therapy

## 2024-09-13 ENCOUNTER — Telehealth: Payer: Self-pay | Admitting: Physical Therapy

## 2024-09-13 NOTE — Telephone Encounter (Signed)
 Patient Name: [name] [name] MRN: [id number] DOB:April 01, 1956, 69 y.o., male Today's Date: 09/13/2024  Received message that pt cancelled last scheduled PT appt.  Therapist called pt to discuss with pt if he wanted to continue physical therapy (and schedule PT appt) or discharge from OP PT.  Pt reporting having transportation issues to get to OP PT.  Discussed with pt option of HHPT but pt declining HHPT.  Pt asking about OP OT referral: pt educated that therapist requested OT referral from PCP but no OP OT referral noted yet; encouraged pt to reach out to PCP regarding OT referral request.  Pt reports he will call back to schedule PT appt.   Damien Caulk, PT 09/13/2024, 4:20 PM

## 2024-09-15 ENCOUNTER — Ambulatory Visit: Admitting: Physical Therapy
